# Patient Record
Sex: Female | Born: 1955
Health system: Southern US, Community
[De-identification: ages and names within clinical notes are randomized; demographics above are authoritative.]

## PROBLEM LIST (undated history)

## (undated) DIAGNOSIS — Z9889 Other specified postprocedural states: Secondary | ICD-10-CM

## (undated) DIAGNOSIS — I872 Venous insufficiency (chronic) (peripheral): Secondary | ICD-10-CM

## (undated) DIAGNOSIS — J45909 Unspecified asthma, uncomplicated: Secondary | ICD-10-CM

## (undated) DIAGNOSIS — G5601 Carpal tunnel syndrome, right upper limb: Secondary | ICD-10-CM

## (undated) DIAGNOSIS — R112 Nausea with vomiting, unspecified: Secondary | ICD-10-CM

## (undated) DIAGNOSIS — D649 Anemia, unspecified: Secondary | ICD-10-CM

## (undated) DIAGNOSIS — K219 Gastro-esophageal reflux disease without esophagitis: Secondary | ICD-10-CM

## (undated) DIAGNOSIS — I10 Essential (primary) hypertension: Secondary | ICD-10-CM

## (undated) DIAGNOSIS — Z8719 Personal history of other diseases of the digestive system: Secondary | ICD-10-CM

## (undated) DIAGNOSIS — M1990 Unspecified osteoarthritis, unspecified site: Secondary | ICD-10-CM

## (undated) DIAGNOSIS — J40 Bronchitis, not specified as acute or chronic: Secondary | ICD-10-CM

## (undated) DIAGNOSIS — J189 Pneumonia, unspecified organism: Secondary | ICD-10-CM

## (undated) HISTORY — PX: ABDOMINAL HYSTERECTOMY: SHX81

## (undated) HISTORY — DX: Venous insufficiency (chronic) (peripheral): I87.2

## (undated) HISTORY — PX: NISSEN FUNDOPLICATION: SHX2091

---

## 2001-04-23 ENCOUNTER — Ambulatory Visit (HOSPITAL_COMMUNITY): Admission: RE | Admit: 2001-04-23 | Discharge: 2001-04-23 | Payer: Self-pay | Admitting: Family Medicine

## 2001-04-23 ENCOUNTER — Encounter: Payer: Self-pay | Admitting: Family Medicine

## 2001-12-11 ENCOUNTER — Encounter: Payer: Self-pay | Admitting: Family Medicine

## 2001-12-11 ENCOUNTER — Ambulatory Visit (HOSPITAL_COMMUNITY): Admission: RE | Admit: 2001-12-11 | Discharge: 2001-12-11 | Payer: Self-pay | Admitting: Family Medicine

## 2002-01-20 ENCOUNTER — Ambulatory Visit (HOSPITAL_COMMUNITY): Admission: RE | Admit: 2002-01-20 | Discharge: 2002-01-20 | Payer: Self-pay | Admitting: Endocrinology

## 2003-07-22 ENCOUNTER — Ambulatory Visit (HOSPITAL_COMMUNITY): Admission: RE | Admit: 2003-07-22 | Discharge: 2003-07-22 | Payer: Self-pay | Admitting: Family Medicine

## 2003-09-02 ENCOUNTER — Ambulatory Visit (HOSPITAL_COMMUNITY): Admission: RE | Admit: 2003-09-02 | Discharge: 2003-09-02 | Payer: Self-pay | Admitting: Obstetrics & Gynecology

## 2004-07-03 ENCOUNTER — Emergency Department (HOSPITAL_COMMUNITY): Admission: EM | Admit: 2004-07-03 | Discharge: 2004-07-03 | Payer: Self-pay | Admitting: Emergency Medicine

## 2005-09-27 ENCOUNTER — Ambulatory Visit (HOSPITAL_COMMUNITY): Admission: RE | Admit: 2005-09-27 | Discharge: 2005-09-27 | Payer: Self-pay | Admitting: Family Medicine

## 2005-10-22 ENCOUNTER — Ambulatory Visit (HOSPITAL_COMMUNITY): Admission: RE | Admit: 2005-10-22 | Discharge: 2005-10-22 | Payer: Self-pay | Admitting: Internal Medicine

## 2005-10-22 ENCOUNTER — Encounter (INDEPENDENT_AMBULATORY_CARE_PROVIDER_SITE_OTHER): Payer: Self-pay | Admitting: Specialist

## 2005-10-22 ENCOUNTER — Ambulatory Visit: Payer: Self-pay | Admitting: Internal Medicine

## 2006-03-27 ENCOUNTER — Ambulatory Visit: Payer: Self-pay | Admitting: Internal Medicine

## 2006-04-01 ENCOUNTER — Ambulatory Visit (HOSPITAL_COMMUNITY): Admission: RE | Admit: 2006-04-01 | Discharge: 2006-04-01 | Payer: Self-pay | Admitting: Internal Medicine

## 2006-08-14 ENCOUNTER — Ambulatory Visit: Payer: Self-pay | Admitting: Internal Medicine

## 2007-03-11 ENCOUNTER — Ambulatory Visit: Payer: Self-pay | Admitting: Vascular Surgery

## 2007-04-15 ENCOUNTER — Ambulatory Visit: Payer: Self-pay | Admitting: Vascular Surgery

## 2007-04-22 ENCOUNTER — Ambulatory Visit: Payer: Self-pay | Admitting: Vascular Surgery

## 2007-06-17 ENCOUNTER — Ambulatory Visit: Payer: Self-pay | Admitting: Vascular Surgery

## 2007-06-24 ENCOUNTER — Ambulatory Visit: Payer: Self-pay | Admitting: Vascular Surgery

## 2007-08-07 ENCOUNTER — Ambulatory Visit: Payer: Self-pay | Admitting: Vascular Surgery

## 2008-12-22 ENCOUNTER — Ambulatory Visit (HOSPITAL_COMMUNITY): Admission: RE | Admit: 2008-12-22 | Discharge: 2008-12-22 | Payer: Self-pay | Admitting: Family Medicine

## 2010-10-12 ENCOUNTER — Other Ambulatory Visit (HOSPITAL_COMMUNITY): Payer: Self-pay | Admitting: Family Medicine

## 2010-10-12 DIAGNOSIS — Z139 Encounter for screening, unspecified: Secondary | ICD-10-CM

## 2010-11-06 ENCOUNTER — Ambulatory Visit (HOSPITAL_COMMUNITY)
Admission: RE | Admit: 2010-11-06 | Discharge: 2010-11-06 | Disposition: A | Discharge status: Home / Self Care | Payer: BC Managed Care – PPO | Source: Ambulatory Visit | Attending: Family Medicine | Admitting: Family Medicine

## 2010-11-06 DIAGNOSIS — Z1231 Encounter for screening mammogram for malignant neoplasm of breast: Secondary | ICD-10-CM | POA: Insufficient documentation

## 2010-11-06 DIAGNOSIS — Z139 Encounter for screening, unspecified: Secondary | ICD-10-CM

## 2010-11-27 NOTE — Assessment & Plan Note (Signed)
OFFICE VISIT   Mehler, Ryan P  DOB:  [birth year]                                       08/07/2007  [id number]#:[id number]   The patient presents today for continued followup of her bilateral  staged laser ablation of her greater saphenous vein and stab  phlebectomies of tributary varicosities.  She had had bleeding from her  right ankle region prior to her procedures.  She is quite pleased with  the results.  She reports that she has had marked improvement in  discomfort and swelling in her lower extremities.   PHYSICAL EXAM:  Her bruising has all totally resolved.  She does not  have any evidence of marked varicosities.  I am quite pleased with her  result and I will see her again on an as needed basis.   Larina Earthly, M.D.  Electronically Signed   TFE/MEDQ  D:  08/07/2007  T:  08/10/2007  Job:  [id number]   cc:   Patrica Duel, M.D.

## 2010-11-27 NOTE — Assessment & Plan Note (Signed)
OFFICE VISIT   Denz, Lashelle P  DOB:  April 12, 1956                                       04/22/2007  [id number]#:[id number]   OFFICE VISIT:  The patient presents in the office today for follow-up of  her right leg laser ablation and sclerotherapy of reticular veins that  have bled at her right ankle.  She has the usual amount of erythema at  the medial thigh.  She did have some mild irritation from the stocking.  She underwent limited venous duplex from me, and this revealed closure  of her right vein from the knee to just below the saphenofemoral  junction, and no injury to the common femoral vein.  She was pleased  with her initial result, and will scheduled her left leg ablation around  her work schedule.   Larina Earthly, M.D.  Electronically Signed   TFE/MEDQ  D:  04/22/2007  T:  04/23/2007  Job:  [id number]   cc:   Patrica Duel, M.D.

## 2010-11-27 NOTE — Consult Note (Signed)
VASCULAR SURGERY CONSULTATION   Jessica [name], Yuli P  DOB:  10/19/1955                                       03/11/2007  EAVWU#:[id number]   Jessica [name] presents today for evaluation of venous pathology and  recent bleeding from her right ankle.  She has a long history of  swelling and venous hypertension and had two episodes of bleeding from a  superficial varix on her right ankle.  She called the paramedics on the  first episode of this and was treated with elevation and local care.  Had one additional bleed, was seen by Dr. Nobie Putnam, and had a treatment  with topical antibiotic and compression.  I am seeing her for further  discussion of this.  She does have thickening on both lower extremities  and does have a heavy feeling with prolonged standing.   PAST MEDICAL HISTORY:  1. Hypertension, which is stable.  2. She has had multiple surgical procedures in the late 90s for hiatal      hernia.  3. She had a hysterectomy in 1993.   She does not smoke having quit in 1996, she does not drink alcohol.   PHYSICAL EXAMINATION:  General:  She is a well-developed, moderately  obese, white female in no acute distress.  Lower extremities:  Notable  for 2+ dorsalis pedis pulses bilaterally.  She has marked hemosiderin  deposits in both lower extremities from her mid calf distally, medial  and laterally.  She has multiple small superficial varices over both  ankles extending down onto her feet.  She does have an area where she  has bled twice with a superficial varix at her right ankle.  She also  has an area that gives the appearance of a healed ulcer on her medial  left ankle, but reports that she has never had any skin breakdown over  this area.   She underwent a duplex evaluation in our office and this reveals marked  reflux from her saphenofemoral junction distally in both great saphenous  veins.  Her deep venous study shows no evidence of reflux or DVT  bilaterally.   I discussed this at length with [name].  I explained  that she does have marked bilateral venous hypertension and this has led  to her recurrent episode of significant bleeding from her right ankle.  I explained the treatment would be laser ablation of her right great  saphenous vein for reduction in her venous hypertension.  I have also  recommended sclerotherapy of the areas that have bled for reduction of a  bleeding risk in the future.  She has not had any bleeding from her left  leg but does have a marked progression of venous hypertension changes  with hemosiderin deposit and thickening and that I would recommend a  staged left leg laser ablation when she has recovered from her right  side and we have assured no further bleeding episodes.  We will fit her  with a graduated compression stocking in the interim and instructed her  on the use of these.  We will schedule her right leg treatment once  insurance coverage is assured.   Larina Earthly, M.D.  Electronically Signed  TFE/MEDQ  D:  03/11/2007  T:  03/12/2007  Job:  [id number]

## 2010-11-27 NOTE — Procedures (Signed)
LOWER EXTREMITY VENOUS REFLUX EXAM   INDICATION:  Bilateral legs varicose veins and pain.   EXAM:  Using color-flow imaging and pulse Doppler spectral analysis, the  right and left common femoral, superficial femoral, popliteal, posterior  tibial, greater and lesser saphenous veins are evaluated.  There is no  evidence suggesting deep venous insufficiency in the right and left  lower extremity.   The right and left saphenofemoral junction is not competent.  The right  and left GSV is not competent with the caliber as described below.   The right and left proximal short saphenous vein demonstrates  competency.   GSV Diameter (used if found to be incompetent only)                                            Right    Left  Proximal Greater Saphenous Vein           0.54 cm  0.85 cm  Proximal-to-mid-thigh                     0.54 cm  0.85 cm  Mid thigh                                 0.48 cm  0.46 cm  Mid-distal thigh                          0.48 cm  0.46 cm  Distal thigh                              0.49 cm  0.44 cm  Knee                                      0.54 cm  0.44 cm   IMPRESSION:  1. Right and left greater saphenous vein reflux is identified with the      caliber ranging from on the right 0.54 cm to 0.54 cm knee to groin      and left is 0.44 to 0.85 knee to groin.  2. The right and left greater saphenous vein is not aneurysmal.  3. The right and left greater saphenous vein is not tortuous.  4. There is no evidence of significant deep venous insufficiency in      bilateral legs.  5. The deep venous system is competent.  6. The right and left lesser saphenous vein is competent.   ___________________________________________  Larina Earthly, M.D.   MG/MEDQ  D:  03/11/2007  T:  03/12/2007  Job:  [id number]

## 2010-11-27 NOTE — Assessment & Plan Note (Signed)
OFFICE VISIT   [name], Jessica P  DOB:  06-09-1956                                       06/24/2007  [id number]#:[id number]   The patient presents today 1 week status post left laser ablation and  stab phlebectomy of tributary varicosities of her left leg.  She has had  mild discomfort related to this.  She does have an amount of erythema  over the medial thigh and a mild bruising of the phlebectomy sites.  She  underwent limited venous duplex and this reveals closure of her left  greater saphenous vein up to just below the saphenofemoral junction.  Her common femoral vein is widely patent with no evidence of jaundice.  I am quite pleased with her initial result, as is the patient.  I will  see her again in 6 weeks for continued followup.   Larina Earthly, M.D.  Electronically Signed   TFE/MEDQ  D:  06/24/2007  T:  06/25/2007  Job:  [id number]

## 2010-11-30 NOTE — Op Note (Signed)
Jessica [name], Jessica [name]               ACCOUNT NO.:  [id number]   MEDICAL RECORD NO.:  [id number]          PATIENT TYPE:  AMB   LOCATION:  DAY                           FACILITY:  [facility]   PHYSICIAN:  Lionel December, M.D.    DATE OF BIRTH:  [birth year]   DATE OF PROCEDURE:  10/22/2005  DATE OF DISCHARGE:                                 OPERATIVE REPORT   PROCEDURE:  Colonoscopy.   INDICATIONS:  Jessica [name] is a 55 year old Caucasian female who is here for a  screening colonoscopy.  Family history is negative for colorectal carcinoma.  The procedure is reviewed with the patient and informed consent was  obtained.   MEDICATIONS FOR CONSCIOUS SEDATION:  Demerol 50 mg IV, Versed 5 mg IV.   FINDINGS:  The procedure performed in endoscopy suite.  The patient's vital  signs and O2 sat were monitored during procedure and remained stable.  The  patient was placed in the left lateral decubitus position, rectal  examination performed.  No abnormality noted on external or digital exam.  The Olympus videoscope was placed in the rectum and advanced under vision  into the sigmoid colon and beyond.  Preparation was satisfactory.  The scope  was passed into the ascending colon and cecum.  The patient had to be turned  in a supine position in order to see the area behind the ileocecal valve.  Pictures taken of the appendiceal orifice and ileocecal valve.  As the scope  was withdrawn, the colonic mucosa was carefully examined and was normal  throughout.  There was 3 mm polyp at the rectosigmoid junction which was  ablated via cold biopsy.  The scope was retroflexed to examine the anorectal  junction and small hemorrhoids noted below the dentate line.  The endoscope  was straightened and withdrawn.  The patient tolerated the procedure well.   FINAL DIAGNOSIS:  Small polyp ablated via cold biopsy from rectosigmoid  junction.  Small external hemorrhoids.   RECOMMENDATIONS:  Standard instructive given. I will be  contacting the  patient with biopsy results and further recommendations.      Lionel December, M.D.  Electronically Signed     NR/MEDQ  D:  10/22/2005  T:  10/22/2005  Job:  [id number]   cc:   Kirk Ruths, M.D.  Fax: [phone]

## 2010-12-31 ENCOUNTER — Encounter (INDEPENDENT_AMBULATORY_CARE_PROVIDER_SITE_OTHER): Payer: BC Managed Care – PPO

## 2010-12-31 ENCOUNTER — Encounter (INDEPENDENT_AMBULATORY_CARE_PROVIDER_SITE_OTHER): Payer: BC Managed Care – PPO | Admitting: Vascular Surgery

## 2010-12-31 DIAGNOSIS — I83893 Varicose veins of bilateral lower extremities with other complications: Secondary | ICD-10-CM

## 2010-12-31 NOTE — Assessment & Plan Note (Signed)
OFFICE VISIT  Jessica [name], Jessica [name] P DOB:  1955-09-26                                       12/31/2010 [id number]#:[id number]  The patient is a patient well known to our practice who had laser ablation of both great saphenous veins by Dr. Arbie Cookey in 2008.  She also had sclerotherapy.  She had presented with 2 previous episodes of some bleeding varix in her right ankle.  She has done well since these procedures with no symptoms but has continued to wear her long-leg elastic compression stockings and continued elevation.  Today she had an episode of bleeding in the right ankle which was unprovoked.  This did respond to elevation and compression.  She has not been having increasing edema or increasing symptomatology of her legs but has been treating them with elastic compression.  She denies any chest pain, dyspnea on exertion, PND, orthopnea.  CHRONIC MEDICAL PROBLEMS: 1. Hypertension. 2. Previous history for hiatal hernia. 3. Status post hysterectomy.  SOCIAL HISTORY:  She is married.  She has 3 children.  She is an Print production planner.  Does not use tobacco or alcohol.  PHYSICAL EXAM:  Vital signs:  Blood pressure 140/78, heart rate 67, respirations 24.  General:  She is a well-developed, well-nourished female in no apparent distress, alert and oriented x3.  HEENT:  Exam normal for age.  EOMs intact.  Lower extremity:  Exam reveals 3+ femoral, 2+ dorsalis pedis pulses bilaterally.  She has multiple spider and reticular veins in the lower leg particularly from the mid calf to the ankle.  There is 1+ edema bilaterally.  There is some hyperpigmentation bilaterally.  There is a small eschar over the site where the bleeding occurred and adjacent to the medial malleolus.  There is no active bleeding.  Today I ordered venous duplex exam which I reviewed and interpreted. Right great saphenous vein is totally occluded from the distal thigh to the saphenofemoral junction but  there is a branch open in this area which does have reflux.  There is no DVT.  Because of this patient's history of bleeding today I do think that we need to proceed urgently with sclerotherapy of this varix and reticular vein at the ankle to prevent further bleeding hopefully.  We will then apply elastic compression.  She will be going out of town on a vacation soon and in 2 weeks will return for further followup by Dr. Arbie Cookey.    Quita Skye Hart Rochester, M.D. Electronically Signed  JDL/MEDQ  D:  12/31/2010  T:  12/31/2010  Job:  [id number]

## 2011-01-08 NOTE — Procedures (Unsigned)
LOWER EXTREMITY VENOUS REFLUX EXAM  INDICATION:  Right lower extremity pain and varicose veins.  EXAM:  Using color-flow imaging and pulse Doppler spectral analysis, the right common femoral, superficial femoral, popliteal, posterior tibial, greater and lesser saphenous veins are evaluated.  There is no evidence suggesting deep venous insufficiency in the right lower extremity.  The right saphenofemoral junction is not competent with reflux of >559milliseconds. The right GSV from the mid distal thigh segment is not competent with reflux of >572milliseconds with the caliber as described below.  The right proximal short saphenous vein demonstrates competency.  GSV Diameter (used if found to be incompetent only)                                           Right       Left Proximal Greater Saphenous Vein           0.55 cm     cm Proximal-to-mid-thigh                     Thrombosed  cm Mid thigh                                 Thrombosed  cm Mid-distal thigh                          0.45 cm     cm Distal thigh                              0.88 cm     cm Knee                                      0.60 cm     cm Below Knee  0.57 cm  IMPRESSION: 1. The right greater saphenous vein from the mid distal thigh to below-     knee segment is not competent with reflux >500 milliseconds. 2. The right greater saphenous vein is tortuous. 3. The deep venous system is competent. 4. The right small saphenous vein is competent. 5. The right lower extremity deep venous system appears patent and     without thrombus. 6. The proximal to mid/distal thigh segment of the right greater     saphenous vein presents with evidence of previous ablation done     04/15/2007, and appears closed.       ___________________________________________ Quita Skye Hart Rochester, M.D.  SH/MEDQ  D:  12/31/2010  T:  12/31/2010  Job:  [id number]

## 2011-01-15 ENCOUNTER — Ambulatory Visit (INDEPENDENT_AMBULATORY_CARE_PROVIDER_SITE_OTHER): Payer: BC Managed Care – PPO | Admitting: Vascular Surgery

## 2011-01-15 DIAGNOSIS — I83893 Varicose veins of bilateral lower extremities with other complications: Secondary | ICD-10-CM

## 2011-01-17 NOTE — Assessment & Plan Note (Signed)
OFFICE VISIT  Jessica [name], Jessica [name] DOB:  01/04/1956                                       01/15/2011 UEAVW#:[id number]  Patient presents today for follow-up of bleeding from her right medial ankle.  She had seen Dr. Hart Rochester on this day and underwent sclerotherapy by Clementeen Hoof, RN.  She has had no further bleeding.  She thinks that she did have evidence of ablation of her saphenous vein.  When seeing Dr. Hart Rochester with ultrasound, she did have a small branch coming off this. She has had no further bleeding since this study.  Three weeks ago, the area of the sclerotherapy appeared to have successfully occluded these branches.  I discussed this at length with patient.  I do not recommend any attempts at addressing this small branch off of her saphenous vein since I do not feel that this is leading to the area of bleeding.  I would recommend conservative treatment, and if she does continue to have bleeding, would recommend repeat sclerotherapy.  If this does continue to persist, I would then consider other treatment options.  She has not had any difficulty in 3 years, and I suspect this will an isolated incident.  She will notify us should she have recurrent troubles.    Larina Earthly, M.D. Electronically Signed  TFE/MEDQ  D:  01/15/2011  T:  01/17/2011  Job:  [id number]

## 2011-04-15 ENCOUNTER — Ambulatory Visit (HOSPITAL_COMMUNITY)
Admission: RE | Admit: 2011-04-15 | Discharge: 2011-04-15 | Disposition: A | Discharge status: Home / Self Care | Payer: BC Managed Care – PPO | Source: Ambulatory Visit | Attending: Family Medicine | Admitting: Family Medicine

## 2011-04-15 ENCOUNTER — Other Ambulatory Visit (HOSPITAL_COMMUNITY): Payer: Self-pay | Admitting: Family Medicine

## 2011-04-15 DIAGNOSIS — R05 Cough: Secondary | ICD-10-CM | POA: Insufficient documentation

## 2011-04-15 DIAGNOSIS — J069 Acute upper respiratory infection, unspecified: Secondary | ICD-10-CM

## 2011-04-15 DIAGNOSIS — R059 Cough, unspecified: Secondary | ICD-10-CM

## 2011-04-15 DIAGNOSIS — R062 Wheezing: Secondary | ICD-10-CM | POA: Insufficient documentation

## 2011-04-15 DIAGNOSIS — R509 Fever, unspecified: Secondary | ICD-10-CM | POA: Insufficient documentation

## 2012-05-05 ENCOUNTER — Telehealth (INDEPENDENT_AMBULATORY_CARE_PROVIDER_SITE_OTHER): Payer: Self-pay | Admitting: *Deleted

## 2012-05-05 ENCOUNTER — Other Ambulatory Visit (INDEPENDENT_AMBULATORY_CARE_PROVIDER_SITE_OTHER): Payer: Self-pay | Admitting: *Deleted

## 2012-05-05 DIAGNOSIS — Z1211 Encounter for screening for malignant neoplasm of colon: Secondary | ICD-10-CM

## 2012-05-05 DIAGNOSIS — Z86010 Personal history of colonic polyps: Secondary | ICD-10-CM

## 2012-05-05 NOTE — Telephone Encounter (Signed)
Patient needs osmo pill prep 

## 2012-05-06 ENCOUNTER — Telehealth (INDEPENDENT_AMBULATORY_CARE_PROVIDER_SITE_OTHER): Payer: Self-pay | Admitting: *Deleted

## 2012-05-06 NOTE — Telephone Encounter (Signed)
PCP/Requesting MD: mcgough  Name & DOB: Delora Tapp 1956/04/07   Procedure: tcs  Reason/Indication:  Hx poylps  Has patient had this procedure before?  yes  If so, when, by whom and where?  4/07  Is there a family history of colon cancer?  no  Who?  What age when diagnosed?    Is patient diabetic?   no      Does patient have prosthetic heart valve?  no  Do you have a pacemaker?  no  Has patient had joint replacement within last 12 months?  no  Is patient on Coumadin, Plavix and/or Aspirin? no  Medications: ramipril 5 mg daily, nexium 40 mg daily or every other day, hctz 25 mg prn, multi vit  Allergies: codiene, pcn, tetanus  Medication Adjustment:   Procedure date & time: 06/05/12 at 125

## 2012-05-07 MED ORDER — SOD PHOS MONO-SOD PHOS DIBASIC 1.102-0.398 G PO TABS: 1.0000 | ORAL_TABLET | Freq: Once | ORAL | Status: DC

## 2012-05-07 NOTE — Telephone Encounter (Signed)
agree

## 2012-06-01 ENCOUNTER — Encounter (HOSPITAL_COMMUNITY): Payer: Self-pay | Admitting: Pharmacy Technician

## 2012-06-05 ENCOUNTER — Encounter (HOSPITAL_COMMUNITY)
Admission: RE | Disposition: A | Discharge status: Home / Self Care | Payer: Self-pay | Source: Ambulatory Visit | Attending: Internal Medicine

## 2012-06-05 ENCOUNTER — Encounter (HOSPITAL_COMMUNITY): Payer: Self-pay | Admitting: *Deleted

## 2012-06-05 ENCOUNTER — Ambulatory Visit (HOSPITAL_COMMUNITY)
Admission: RE | Admit: 2012-06-05 | Discharge: 2012-06-05 | Disposition: A | Discharge status: Home / Self Care | Payer: BC Managed Care – PPO | Source: Ambulatory Visit | Attending: Internal Medicine | Admitting: Internal Medicine

## 2012-06-05 DIAGNOSIS — D126 Benign neoplasm of colon, unspecified: Secondary | ICD-10-CM

## 2012-06-05 DIAGNOSIS — Z86010 Personal history of colonic polyps: Secondary | ICD-10-CM

## 2012-06-05 DIAGNOSIS — I10 Essential (primary) hypertension: Secondary | ICD-10-CM | POA: Insufficient documentation

## 2012-06-05 DIAGNOSIS — J45909 Unspecified asthma, uncomplicated: Secondary | ICD-10-CM | POA: Insufficient documentation

## 2012-06-05 HISTORY — DX: Unspecified asthma, uncomplicated: J45.909

## 2012-06-05 HISTORY — DX: Unspecified osteoarthritis, unspecified site: M19.90

## 2012-06-05 HISTORY — PX: COLONOSCOPY: SHX5424

## 2012-06-05 HISTORY — DX: Essential (primary) hypertension: I10

## 2012-06-05 HISTORY — DX: Gastro-esophageal reflux disease without esophagitis: K21.9

## 2012-06-05 SURGERY — COLONOSCOPY
Anesthesia: Moderate Sedation

## 2012-06-05 MED ORDER — SODIUM CHLORIDE 0.45 % IV SOLN
INTRAVENOUS | Status: DC
  Administered 2012-06-05: 1000 mL via INTRAVENOUS

## 2012-06-05 MED ORDER — MIDAZOLAM HCL 5 MG/5ML IJ SOLN
INTRAMUSCULAR | Status: AC
  Filled 2012-06-05: qty 10

## 2012-06-05 MED ORDER — MEPERIDINE HCL 50 MG/ML IJ SOLN
INTRAMUSCULAR | Status: AC
  Filled 2012-06-05: qty 1

## 2012-06-05 MED ORDER — MEPERIDINE HCL 50 MG/ML IJ SOLN
INTRAMUSCULAR | Status: DC | PRN
  Administered 2012-06-05 (×2): 25 mg via INTRAVENOUS

## 2012-06-05 MED ORDER — MIDAZOLAM HCL 5 MG/5ML IJ SOLN
INTRAMUSCULAR | Status: DC | PRN
  Administered 2012-06-05 (×2): 2 mg via INTRAVENOUS
  Administered 2012-06-05: 1 mg via INTRAVENOUS

## 2012-06-05 MED ORDER — STERILE WATER FOR IRRIGATION IR SOLN
Status: DC | PRN
  Administered 2012-06-05: 14:00:00

## 2012-06-05 NOTE — Op Note (Signed)
COLONOSCOPY PROCEDURE REPORT  PATIENT:  Jessica [name]  MR#:  [id number] Birthdate:  June 01, 1956, 56 y.o., female Endoscopist:  Dr. Malissa Hippo, MD Referred By:  Dr. Kirk Ruths, MD Procedure Date: 06/05/2012  Procedure:   Colonoscopy  Indications: Patient is 56 year old Caucasian female with history of colonic adenoma in here for surveillance colonoscopy. Her last colonoscopy was in April 2007.  Informed Consent:  The procedure and risks were reviewed with the patient and informed consent was obtained.  Medications:  Demerol 50 mg IV Versed 5 mg IV  Description of procedure:  After a digital rectal exam was performed, that colonoscope was advanced from the anus through the rectum and colon to the area of the cecum, ileocecal valve and appendiceal orifice. The cecum was deeply intubated. These structures were well-seen and photographed for the record. From the level of the cecum and ileocecal valve, the scope was slowly and cautiously withdrawn. The mucosal surfaces were carefully surveyed utilizing scope tip to flexion to facilitate fold flattening as needed. The scope was pulled down into the rectum where a thorough exam including retroflexion was performed.  Findings:   Prep satisfactory. Single small polyp ablated via cold biopsy from mid sigmoid colon. No other mucosal abnormalities identified. Normal rectal mucosa and anal rectal junction.  Therapeutic/Diagnostic Maneuvers Performed:  See above  Complications:  None  Cecal Withdrawal Time:  12 minutes  Impression: Examination performed to cecum. Single  small polyp ablated via cold biopsy from mid sigmoid colon.  Recommendations:  Standard instructions given. I would be contacting patient with biopsy results and further recommendations.  Jericka Kadar U  06/05/2012 2:23 PM  CC: Dr. Kirk Ruths, MD & Dr. Bonnetta Barry ref. provider found

## 2012-06-05 NOTE — H&P (Signed)
Jessica [name] is an 56 y.o. female.   Chief Complaint: Patient is here for colonoscopy. HPI: Patient is 56 year old Caucasian female with history of colonic adenoma. Her last colonoscopy was about 6 years ago. She denies abdominal pain change in her bowel habits or rectal bleeding. Family history is negative for colorectal carcinoma.  Past Medical History  Diagnosis Date  . Hypertension   . GERD (gastroesophageal reflux disease)   . Asthma   . Arthritis     Past Surgical History  Procedure Date  . Abdominal hysterectomy   . Nissen fundoplication     x2    History reviewed. No pertinent family history. Social History:  reports that she has never smoked. She does not have any smokeless tobacco history on file. She reports that she does not drink alcohol or use illicit drugs.  Allergies:  Allergies  Allergen Reactions  . Codeine Other (See Comments)    Severe headaches   . Tetanus Toxoids Swelling and Other (See Comments)    fever  . Penicillins Rash    Medications Prior to Admission  Medication Sig Dispense Refill  . calcium carbonate (OS-CAL) 600 MG TABS Take 600 mg by mouth daily.      . cholecalciferol (VITAMIN D) 1000 UNITS tablet Take 1,000 Units by mouth daily.      Marland Kitchen esomeprazole (NEXIUM) 40 MG capsule Take 40 mg by mouth daily before breakfast.      . hydrochlorothiazide (HYDRODIURIL) 25 MG tablet Take 25 mg by mouth daily.      Marland Kitchen ibuprofen (ADVIL,MOTRIN) 200 MG tablet Take 400 mg by mouth every 6 (six) hours as needed. pain      . naproxen sodium (ANAPROX) 220 MG tablet Take 220 mg by mouth 2 (two) times daily with a meal.      . Omega-3 Fatty Acids (FISH OIL) 1000 MG CAPS Take 1,000 mg by mouth daily.      . Polyvinyl Alcohol-Povidone (FRESHKOTE) 2.7-2 % SOLN Place 1 drop into the left eye 3 (three) times daily.      . ramipril (ALTACE) 5 MG capsule Take 5 mg by mouth daily.      . retapamulin (ALTABAX) 1 % ointment Apply 1 application topically 2 (two) times  daily.      . sodium chloride (MURO 128) 5 % ophthalmic ointment Place 1 drop into the left eye at bedtime.      . sodium phosphates (OSMOPREP) 1.102-0.398 G TABS Take 1 tablet by mouth once.  32 tablet  0    No results found for this or any previous visit (from the past 48 hour(s)). No results found.  ROS  Blood pressure 117/64, temperature 98.4 F (36.9 C), temperature source Oral, resp. rate 15, height 5\' 4"  (1.626 m), weight 270 lb (122.471 kg), SpO2 94.00%. Physical Exam  Constitutional: She appears well-developed and well-nourished.  HENT:  Mouth/Throat: Oropharynx is clear and moist.  Eyes: Conjunctivae normal are normal. No scleral icterus.  Neck: No thyromegaly present.  Cardiovascular: Normal rate, regular rhythm and normal heart sounds.   No murmur heard. Respiratory: Effort normal and breath sounds normal.  GI: Soft. She exhibits no distension and no mass. There is no tenderness.  Musculoskeletal: She exhibits no edema.       Stasis dermatitis or skin involving both legs  Lymphadenopathy:    She has no cervical adenopathy.  Neurological: She is alert.  Skin: Skin is warm.     Assessment/Plan History of colonic adenoma. Evidence colonoscopy  Jessica [name] 06/05/2012, 1:51 PM

## 2012-06-10 ENCOUNTER — Telehealth (INDEPENDENT_AMBULATORY_CARE_PROVIDER_SITE_OTHER): Payer: Self-pay | Admitting: *Deleted

## 2012-06-10 NOTE — Telephone Encounter (Signed)
Patient was called and made aware that the polyp that was removed was benign. Dr.Rehman will review and call her with his recommendations for her next Colonoscopy

## 2012-06-14 ENCOUNTER — Encounter (HOSPITAL_COMMUNITY): Payer: Self-pay | Admitting: Internal Medicine

## 2012-06-22 ENCOUNTER — Encounter (INDEPENDENT_AMBULATORY_CARE_PROVIDER_SITE_OTHER): Payer: Self-pay | Admitting: *Deleted

## 2013-07-12 ENCOUNTER — Ambulatory Visit (HOSPITAL_COMMUNITY)
Admission: RE | Admit: 2013-07-12 | Discharge: 2013-07-12 | Disposition: A | Discharge status: Home / Self Care | Payer: BC Managed Care – PPO | Source: Ambulatory Visit | Attending: Family Medicine | Admitting: Family Medicine

## 2013-07-12 ENCOUNTER — Other Ambulatory Visit (HOSPITAL_COMMUNITY): Payer: Self-pay | Admitting: Family Medicine

## 2013-07-12 DIAGNOSIS — J069 Acute upper respiratory infection, unspecified: Secondary | ICD-10-CM

## 2013-07-12 DIAGNOSIS — R0989 Other specified symptoms and signs involving the circulatory and respiratory systems: Secondary | ICD-10-CM | POA: Insufficient documentation

## 2013-07-12 DIAGNOSIS — R062 Wheezing: Secondary | ICD-10-CM | POA: Insufficient documentation

## 2013-07-12 DIAGNOSIS — I771 Stricture of artery: Secondary | ICD-10-CM | POA: Insufficient documentation

## 2014-04-21 ENCOUNTER — Other Ambulatory Visit (HOSPITAL_COMMUNITY): Payer: Self-pay | Admitting: Physician Assistant

## 2014-04-21 ENCOUNTER — Ambulatory Visit (HOSPITAL_COMMUNITY)
Admission: RE | Admit: 2014-04-21 | Discharge: 2014-04-21 | Disposition: A | Discharge status: Home / Self Care | Payer: BC Managed Care – PPO | Source: Ambulatory Visit | Attending: Physician Assistant | Admitting: Physician Assistant

## 2014-04-21 DIAGNOSIS — G8929 Other chronic pain: Secondary | ICD-10-CM

## 2014-04-21 DIAGNOSIS — S92351A Displaced fracture of fifth metatarsal bone, right foot, initial encounter for closed fracture: Secondary | ICD-10-CM | POA: Diagnosis not present

## 2014-04-21 DIAGNOSIS — X58XXXA Exposure to other specified factors, initial encounter: Secondary | ICD-10-CM | POA: Diagnosis not present

## 2014-04-21 DIAGNOSIS — M79671 Pain in right foot: Secondary | ICD-10-CM | POA: Diagnosis present

## 2015-10-24 DIAGNOSIS — R300 Dysuria: Secondary | ICD-10-CM | POA: Diagnosis not present

## 2015-10-24 DIAGNOSIS — N760 Acute vaginitis: Secondary | ICD-10-CM | POA: Diagnosis not present

## 2016-03-13 DIAGNOSIS — L820 Inflamed seborrheic keratosis: Secondary | ICD-10-CM | POA: Diagnosis not present

## 2016-03-13 DIAGNOSIS — B9689 Other specified bacterial agents as the cause of diseases classified elsewhere: Secondary | ICD-10-CM | POA: Diagnosis not present

## 2016-03-13 DIAGNOSIS — L0202 Furuncle of face: Secondary | ICD-10-CM | POA: Diagnosis not present

## 2016-03-13 DIAGNOSIS — D485 Neoplasm of uncertain behavior of skin: Secondary | ICD-10-CM | POA: Diagnosis not present

## 2016-03-13 DIAGNOSIS — D225 Melanocytic nevi of trunk: Secondary | ICD-10-CM | POA: Diagnosis not present

## 2016-03-27 DIAGNOSIS — D485 Neoplasm of uncertain behavior of skin: Secondary | ICD-10-CM | POA: Diagnosis not present

## 2016-03-27 DIAGNOSIS — D225 Melanocytic nevi of trunk: Secondary | ICD-10-CM | POA: Diagnosis not present

## 2016-03-27 DIAGNOSIS — D2261 Melanocytic nevi of right upper limb, including shoulder: Secondary | ICD-10-CM | POA: Diagnosis not present

## 2016-04-22 DIAGNOSIS — Z6841 Body Mass Index (BMI) 40.0 and over, adult: Secondary | ICD-10-CM | POA: Diagnosis not present

## 2016-04-22 DIAGNOSIS — Z1231 Encounter for screening mammogram for malignant neoplasm of breast: Secondary | ICD-10-CM | POA: Diagnosis not present

## 2016-04-22 DIAGNOSIS — Z01419 Encounter for gynecological examination (general) (routine) without abnormal findings: Secondary | ICD-10-CM | POA: Diagnosis not present

## 2017-04-20 DIAGNOSIS — J209 Acute bronchitis, unspecified: Secondary | ICD-10-CM | POA: Diagnosis not present

## 2017-04-26 DIAGNOSIS — J209 Acute bronchitis, unspecified: Secondary | ICD-10-CM | POA: Diagnosis not present

## 2017-05-13 DIAGNOSIS — Z1389 Encounter for screening for other disorder: Secondary | ICD-10-CM | POA: Diagnosis not present

## 2017-05-13 DIAGNOSIS — Z6841 Body Mass Index (BMI) 40.0 and over, adult: Secondary | ICD-10-CM | POA: Diagnosis not present

## 2017-05-13 DIAGNOSIS — R05 Cough: Secondary | ICD-10-CM | POA: Diagnosis not present

## 2017-05-13 DIAGNOSIS — R498 Other voice and resonance disorders: Secondary | ICD-10-CM | POA: Diagnosis not present

## 2017-05-13 DIAGNOSIS — K219 Gastro-esophageal reflux disease without esophagitis: Secondary | ICD-10-CM | POA: Diagnosis not present

## 2017-05-30 ENCOUNTER — Encounter: Payer: Self-pay | Admitting: Family Medicine

## 2017-05-30 ENCOUNTER — Ambulatory Visit (INDEPENDENT_AMBULATORY_CARE_PROVIDER_SITE_OTHER): Payer: BLUE CROSS/BLUE SHIELD | Admitting: Family Medicine

## 2017-05-30 VITALS — BP 136/92 | HR 80 | Temp 98.0°F | Resp 18 | Ht 64.0 in | Wt 290.0 lb

## 2017-05-30 DIAGNOSIS — Z0000 Encounter for general adult medical examination without abnormal findings: Secondary | ICD-10-CM

## 2017-05-30 DIAGNOSIS — Z7689 Persons encountering health services in other specified circumstances: Secondary | ICD-10-CM

## 2017-05-30 DIAGNOSIS — I10 Essential (primary) hypertension: Secondary | ICD-10-CM | POA: Diagnosis not present

## 2017-05-30 DIAGNOSIS — Z23 Encounter for immunization: Secondary | ICD-10-CM

## 2017-05-30 LAB — CBC WITH DIFFERENTIAL/PLATELET
BASOS ABS: 51 {cells}/uL (ref 0–200)
Basophils Relative: 0.7 %
EOS ABS: 80 {cells}/uL (ref 15–500)
Eosinophils Relative: 1.1 %
HCT: 40.7 % (ref 35.0–45.0)
Hemoglobin: 13.5 g/dL (ref 11.7–15.5)
Lymphs Abs: 2803 cells/uL (ref 850–3900)
MCH: 27.8 pg (ref 27.0–33.0)
MCHC: 33.2 g/dL (ref 32.0–36.0)
MCV: 83.9 fL (ref 80.0–100.0)
MONOS PCT: 8.2 %
MPV: 11.2 fL (ref 7.5–12.5)
Neutro Abs: 3767 cells/uL (ref 1500–7800)
Neutrophils Relative %: 51.6 %
PLATELETS: 211 10*3/uL (ref 140–400)
RBC: 4.85 10*6/uL (ref 3.80–5.10)
RDW: 14.5 % (ref 11.0–15.0)
TOTAL LYMPHOCYTE: 38.4 %
WBC mixed population: 599 cells/uL (ref 200–950)
WBC: 7.3 10*3/uL (ref 3.8–10.8)

## 2017-05-30 LAB — COMPLETE METABOLIC PANEL WITH GFR
AG Ratio: 1.2 (calc) (ref 1.0–2.5)
ALKALINE PHOSPHATASE (APISO): 89 U/L (ref 33–130)
ALT: 19 U/L (ref 6–29)
AST: 19 U/L (ref 10–35)
Albumin: 3.9 g/dL (ref 3.6–5.1)
BUN: 16 mg/dL (ref 7–25)
CHLORIDE: 105 mmol/L (ref 98–110)
CO2: 30 mmol/L (ref 20–32)
Calcium: 9.4 mg/dL (ref 8.6–10.4)
Creat: 0.9 mg/dL (ref 0.50–0.99)
GFR, Est African American: 79 mL/min/{1.73_m2} (ref >=60)
GFR, Est Non African American: 68 mL/min/{1.73_m2} (ref >=60)
GLUCOSE: 108 mg/dL — AB (ref 65–99)
Globulin: 3.2 g/dL (calc) (ref 1.9–3.7)
Potassium: 4.3 mmol/L (ref 3.5–5.3)
Sodium: 141 mmol/L (ref 135–146)
Total Bilirubin: 0.4 mg/dL (ref 0.2–1.2)
Total Protein: 7.1 g/dL (ref 6.1–8.1)

## 2017-05-30 LAB — LIPID PANEL
CHOLESTEROL: 208 mg/dL — AB (ref <200)
HDL: 48 mg/dL — ABNORMAL LOW (ref >50)
LDL CHOLESTEROL (CALC): 141 mg/dL — AB
Non-HDL Cholesterol (Calc): 160 mg/dL (calc) — ABNORMAL HIGH (ref <130)
Total CHOL/HDL Ratio: 4.3 (calc) (ref <5.0)
Triglycerides: 88 mg/dL (ref <150)

## 2017-05-30 NOTE — Addendum Note (Signed)
Addended by: Shary Decamp B on: 05/30/2017 12:54 PM   Modules accepted: Orders

## 2017-05-30 NOTE — Progress Notes (Signed)
Subjective:    Patient ID: Jessica [name], female    DOB: 07/07/1956, 61 y.o.   MRN: [id number]  HPI Patient is a very pleasant 61 year old Caucasian female here today to establish care.  Past medical history is significant for hypertension, acid reflux status post Nissen fundoplication x2, asthma.  She also has a history of a partial hysterectomy due to vaginal bleeding as well as a spiral fracture of her right leg.  Review of her immunization status shows that she is due for a flu shot.  She is also due for Pneumovax 23 given her history of asthma.  She is due for the tetanus shot but she is allergic to tetanus and declines this.  She is also due for the shingles vaccine but she would like to defer this to a later date.  She sees her gynecologist, Dr. Stann Mainland, grams and Pap smears and pelvic exams.  Her last mammogram was more than a year ago.  I recommended that she repeat that.  Her last bone density was 4 years ago and was normal.  Her last colonoscopy was in 2013 and is not due again till 2023. Past Medical History:  Diagnosis Date  . Arthritis   . Asthma   . Chronic venous insufficiency   . GERD (gastroesophageal reflux disease)   . Hypertension    Past Surgical History:  Procedure Laterality Date  . ABDOMINAL HYSTERECTOMY    . COLONOSCOPY N/A 06/05/2012   Performed by Rogene Houston, MD at [place]  . NISSEN FUNDOPLICATION     x2   Current Outpatient Medications on File Prior to Visit  Medication Sig Dispense Refill  . albuterol (PROAIR HFA) 108 (90 Base) MCG/ACT inhaler Inhale 2 puffs every 6 (six) hours as needed into the lungs for wheezing or shortness of breath.    . calcium carbonate (OS-CAL) 600 MG TABS Take 600 mg by mouth daily.    . cetirizine (ZYRTEC) 10 MG tablet Take 10 mg daily by mouth.    . cholecalciferol (VITAMIN D) 1000 UNITS tablet Take 1,000 Units by mouth daily.    . Coenzyme Q10-Vitamin E (QUNOL ULTRA COQ10 PO) Take by mouth.    . esomeprazole  (NEXIUM) 40 MG capsule Take 40 mg by mouth daily before breakfast.    . hydrochlorothiazide (HYDRODIURIL) 25 MG tablet Take 25 mg by mouth daily.    . naproxen sodium (ANAPROX) 220 MG tablet Take 220 mg by mouth 2 (two) times daily with a meal.    . ramipril (ALTACE) 5 MG capsule Take 5 mg by mouth daily.    . sodium chloride (MURO 128) 5 % ophthalmic ointment Place 1 drop into the left eye at bedtime.     No current facility-administered medications on file prior to visit.    Allergies  Allergen Reactions  . Codeine Other (See Comments)    Severe headaches   . Tetanus Toxoids Swelling and Other (See Comments)    fever  . Penicillins Rash   Social History   Socioeconomic History  . Marital status: Married    Spouse name: Not on file  . Number of children: Not on file  . Years of education: Not on file  . Highest education level: Not on file  Social Needs  . Financial resource strain: Not on file  . Food insecurity - worry: Not on file  . Food insecurity - inability: Not on file  . Transportation needs - medical: Not on file  .  Transportation needs - non-medical: Not on file  Occupational History  . Not on file  Tobacco Use  . Smoking status: Never Smoker  . Smokeless tobacco: Never Used  Substance and Sexual Activity  . Alcohol use: No  . Drug use: No  . Sexual activity: Not on file  Other Topics Concern  . Not on file  Social History Narrative  . Not on file   No family history on file.    Review of Systems  All other systems reviewed and are negative.      Objective:   Physical Exam  Constitutional: She is oriented to person, place, and time. She appears well-developed and well-nourished. No distress.  HENT:  Head: Normocephalic and atraumatic.  Right Ear: External ear normal.  Left Ear: External ear normal.  Nose: Nose normal.  Mouth/Throat: Oropharynx is clear and moist. No oropharyngeal exudate.  Eyes: Conjunctivae and EOM are normal. Pupils are  equal, round, and reactive to light. Right eye exhibits no discharge. Left eye exhibits no discharge. No scleral icterus.  Neck: Normal range of motion. Neck supple. No JVD present. No tracheal deviation present. No thyromegaly present.  Cardiovascular: Normal rate, regular rhythm, normal heart sounds and intact distal pulses. Exam reveals no gallop and no friction rub.  No murmur heard. Pulmonary/Chest: Effort normal and breath sounds normal. No stridor. No respiratory distress. She has no wheezes. She has no rales. She exhibits no tenderness.  Abdominal: Soft. Bowel sounds are normal. She exhibits no distension and no mass. There is no tenderness. There is no rebound and no guarding.  Musculoskeletal: Normal range of motion. She exhibits no edema, tenderness or deformity.  Lymphadenopathy:    She has no cervical adenopathy.  Neurological: She is alert and oriented to person, place, and time. She has normal reflexes. She displays normal reflexes. No cranial nerve deficit. She exhibits normal muscle tone. Coordination normal.  Skin: Skin is warm. No rash noted. She is not diaphoretic. No erythema. No pallor.  Psychiatric: She has a normal mood and affect. Her behavior is normal. Judgment and thought content normal.  Vitals reviewed.         Assessment & Plan:  Benign essential HTN - Plan: CBC with Differential/Platelet, COMPLETE METABOLIC PANEL WITH GFR, Lipid panel  Encounter to establish care with new doctor  General medical exam  We discussed HIV and hepatitis C screening and the patient declined that today due to lack of risk factors.  She states that she will schedule herself for her mammogram.  Her colonoscopy is up-to-date.  She does not require Pap smear given her history of a hysterectomy.  Her bone density was 4 years ago.  Her blood pressure today is borderline but she is a little bit nervous.  I recommend that she check that more frequently at home and notify me of the values.   I will check a CBC, CMP, and fasting lipid panel.  She received her flu shot today along with Pneumovax 23.  I encouraged her to get a shingles vaccine at her convenience.

## 2017-06-10 ENCOUNTER — Encounter: Payer: Self-pay | Admitting: Family Medicine

## 2017-10-20 ENCOUNTER — Other Ambulatory Visit: Payer: Self-pay | Admitting: Family Medicine

## 2017-10-20 MED ORDER — RAMIPRIL 5 MG PO CAPS: 5.0000 mg | ORAL_CAPSULE | Freq: Every day | ORAL | 2 refills | Status: DC

## 2018-01-19 ENCOUNTER — Telehealth: Payer: Self-pay | Admitting: Physician Assistant

## 2018-01-19 NOTE — Telephone Encounter (Signed)
Patient is calling to get refill on her ramipril  She will need this to go to a pharmacy where she is on vacation, she will not know pharmacy until she gets to her destination and requested that she give the nurse this info when called  [phone]

## 2018-01-20 NOTE — Telephone Encounter (Signed)
Noted  

## 2018-04-28 ENCOUNTER — Ambulatory Visit (INDEPENDENT_AMBULATORY_CARE_PROVIDER_SITE_OTHER): Payer: BLUE CROSS/BLUE SHIELD | Admitting: Family Medicine

## 2018-04-28 ENCOUNTER — Encounter: Payer: Self-pay | Admitting: Family Medicine

## 2018-04-28 VITALS — BP 150/96 | HR 94 | Temp 98.1°F | Resp 18 | Ht 64.0 in | Wt 277.0 lb

## 2018-04-28 DIAGNOSIS — Z23 Encounter for immunization: Secondary | ICD-10-CM

## 2018-04-28 DIAGNOSIS — I872 Venous insufficiency (chronic) (peripheral): Secondary | ICD-10-CM

## 2018-04-28 MED ORDER — FUROSEMIDE 40 MG PO TABS: 40.0000 mg | ORAL_TABLET | Freq: Every day | ORAL | 0 refills | Status: DC

## 2018-04-28 NOTE — Progress Notes (Signed)
Subjective:    Patient ID: Jessica [name], female    DOB: 05-24-1956, 62 y.o.   MRN: [id number]  HPI Patient has a history of chronic venous insufficiency and numerous moderate to large varicose veins.  She also has chronic venous stasis dermatitis on the shins of both 6.  She has +1 pitting edema in both ankles.  Recently she has developed a vesicle that is approximately 1 cm in diameter on the anterior surface of her right shin.  Around the vesicle, there is +2 pitting edema.  I believe the vesicle is due to worsening edema secondary to her chronic venous insufficiency.  There is no other rash anywhere else on the body.  There is no erythema.  There is no warmth.  There is no pain. Past Medical History:  Diagnosis Date  . Arthritis   . Asthma   . Chronic venous insufficiency   . GERD (gastroesophageal reflux disease)   . Hypertension    Past Surgical History:  Procedure Laterality Date  . ABDOMINAL HYSTERECTOMY    . COLONOSCOPY  06/05/2012   Procedure: COLONOSCOPY;  Surgeon: Rogene Houston, MD;  Location: [facility];  Service: Endoscopy;  Laterality: N/A;  125-changed to 1225 Ann to notify pt  . NISSEN FUNDOPLICATION     x2   Current Outpatient Medications on File Prior to Visit  Medication Sig Dispense Refill  . albuterol (PROAIR HFA) 108 (90 Base) MCG/ACT inhaler Inhale 2 puffs every 6 (six) hours as needed into the lungs for wheezing or shortness of breath.    . calcium carbonate (OS-CAL) 600 MG TABS Take 600 mg by mouth daily.    . cetirizine (ZYRTEC) 10 MG tablet Take 10 mg daily by mouth.    . cholecalciferol (VITAMIN D) 1000 UNITS tablet Take 1,000 Units by mouth daily.    . Coenzyme Q10-Vitamin E (QUNOL ULTRA COQ10 PO) Take by mouth.    . esomeprazole (NEXIUM) 40 MG capsule Take 40 mg by mouth daily before breakfast.    . hydrochlorothiazide (HYDRODIURIL) 25 MG tablet Take 25 mg by mouth daily.    . naproxen sodium (ANAPROX) 220 MG tablet Take 220 mg by mouth 2 (two)  times daily with a meal.    . ramipril (ALTACE) 5 MG capsule Take 1 capsule (5 mg total) by mouth daily. 90 capsule 2  . sodium chloride (MURO 128) 5 % ophthalmic ointment Place 1 drop into the left eye at bedtime.     No current facility-administered medications on file prior to visit.    Allergies  Allergen Reactions  . Codeine Other (See Comments)    Severe headaches   . Tetanus Antitoxin   . Tetanus Toxoids Swelling and Other (See Comments)    fever  . Penicillins Rash   Social History   Socioeconomic History  . Marital status: Married    Spouse name: Not on file  . Number of children: Not on file  . Years of education: Not on file  . Highest education level: Not on file  Occupational History  . Not on file  Social Needs  . Financial resource strain: Not on file  . Food insecurity:    Worry: Not on file    Inability: Not on file  . Transportation needs:    Medical: Not on file    Non-medical: Not on file  Tobacco Use  . Smoking status: Never Smoker  . Smokeless tobacco: Never Used  Substance and Sexual Activity  . Alcohol  use: No  . Drug use: No  . Sexual activity: Not on file  Lifestyle  . Physical activity:    Days per week: Not on file    Minutes per session: Not on file  . Stress: Not on file  Relationships  . Social connections:    Talks on phone: Not on file    Gets together: Not on file    Attends religious service: Not on file    Active member of club or organization: Not on file    Attends meetings of clubs or organizations: Not on file    Relationship status: Not on file  . Intimate partner violence:    Fear of current or ex partner: Not on file    Emotionally abused: Not on file    Physically abused: Not on file    Forced sexual activity: Not on file  Other Topics Concern  . Not on file  Social History Narrative  . Not on file      Review of Systems  All other systems reviewed and are negative.      Objective:   Physical Exam    Neck: No JVD present.  Cardiovascular: Normal rate, regular rhythm and normal heart sounds.  No murmur heard. Pulmonary/Chest: Effort normal and breath sounds normal. No stridor. No respiratory distress. She has no wheezes. She has no rales.  Musculoskeletal: She exhibits edema.  Skin:      Chronic venous stasis changes in both legs with pitting edema in both legs and a 1 cm to 1.5 cm yellow vesicle on the anterior surface of the right shin       Assessment & Plan:  Chronic venous insufficiency  Tinea hydrochlorothiazide.  Recommended compression hose versus an Unna boot as well as Lasix 40 mg a day.  Patient elects to use compression hose and Lasix 40 mg a day and elevate her legs as much as possible and recheck later this week.  If not improving we will switch to an [employer].  Recheck next week even if better to recheck renal function on Lasix

## 2018-04-28 NOTE — Addendum Note (Signed)
Addended by: Shary Decamp B on: 04/28/2018 11:44 AM   Modules accepted: Orders

## 2018-05-05 ENCOUNTER — Ambulatory Visit (INDEPENDENT_AMBULATORY_CARE_PROVIDER_SITE_OTHER): Payer: BLUE CROSS/BLUE SHIELD | Admitting: Family Medicine

## 2018-05-05 ENCOUNTER — Encounter: Payer: Self-pay | Admitting: Family Medicine

## 2018-05-05 VITALS — BP 122/80 | HR 83 | Temp 97.9°F | Resp 18 | Ht 64.0 in | Wt 276.0 lb

## 2018-05-05 DIAGNOSIS — G609 Hereditary and idiopathic neuropathy, unspecified: Secondary | ICD-10-CM | POA: Diagnosis not present

## 2018-05-05 DIAGNOSIS — M7989 Other specified soft tissue disorders: Secondary | ICD-10-CM

## 2018-05-05 DIAGNOSIS — G6289 Other specified polyneuropathies: Secondary | ICD-10-CM | POA: Diagnosis not present

## 2018-05-05 DIAGNOSIS — R6889 Other general symptoms and signs: Secondary | ICD-10-CM | POA: Diagnosis not present

## 2018-05-05 DIAGNOSIS — Z1329 Encounter for screening for other suspected endocrine disorder: Secondary | ICD-10-CM | POA: Diagnosis not present

## 2018-05-05 NOTE — Progress Notes (Signed)
Subjective:    Patient ID: Jessica [name], female    DOB: February 28, 1956, 62 y.o.   MRN: [id number]  HPI  04/28/18 Patient has a history of chronic venous insufficiency and numerous moderate to large varicose veins.  She also has chronic venous stasis dermatitis on the shins of both 6.  She has +1 pitting edema in both ankles.  Recently she has developed a vesicle that is approximately 1 cm in diameter on the anterior surface of her right shin.  Around the vesicle, there is +2 pitting edema.  I believe the vesicle is due to worsening edema secondary to her chronic venous insufficiency.  There is no other rash anywhere else on the body.  There is no erythema.  There is no warmth.  There is no pain.  At that time, my plan was: Stop hydrochlorothiazide.  Recommended compression hose versus an Unna boot as well as Lasix 40 mg a day.  Patient elects to use compression hose and Lasix 40 mg a day and elevate her legs as much as possible and recheck later this week.  If not improving we will switch to an [employer].  Recheck next week even if better to recheck renal function on Lasix  05/05/18 Patient's weight is down 1 pound from last week however the swelling has improved markedly in both legs.  The blister on her right shin is slowly resolving.  It is now flat and even with the surface although there is some separation of the epidermis from the underlying layer.  There is no weeping edema.  She is taking Lasix without difficulty.  She is also compliant with her compression hose which seem to be helping.  However, in retrospect, the patient does report some mild shortness of breath as well as hot flashes.  She is concerned about underlying heart disease.  She denies any chest pain however she does have some dyspnea on exertion.  She also is reporting burning stinging pain in both feet.  Is primarily on the dorsums of both feet.  Last lab work was a year ago and showed a slightly elevated blood sugar of 108 Past  Medical History:  Diagnosis Date  . Arthritis   . Asthma   . Chronic venous insufficiency   . GERD (gastroesophageal reflux disease)   . Hypertension    Past Surgical History:  Procedure Laterality Date  . ABDOMINAL HYSTERECTOMY    . COLONOSCOPY  06/05/2012   Procedure: COLONOSCOPY;  Surgeon: Rogene Houston, MD;  Location: [facility];  Service: Endoscopy;  Laterality: N/A;  125-changed to 1225 Ann to notify pt  . NISSEN FUNDOPLICATION     x2   Current Outpatient Medications on File Prior to Visit  Medication Sig Dispense Refill  . albuterol (PROAIR HFA) 108 (90 Base) MCG/ACT inhaler Inhale 2 puffs every 6 (six) hours as needed into the lungs for wheezing or shortness of breath.    . calcium carbonate (OS-CAL) 600 MG TABS Take 600 mg by mouth daily.    . cetirizine (ZYRTEC) 10 MG tablet Take 10 mg daily by mouth.    . cholecalciferol (VITAMIN D) 1000 UNITS tablet Take 1,000 Units by mouth daily.    . Coenzyme Q10-Vitamin E (QUNOL ULTRA COQ10 PO) Take by mouth.    . esomeprazole (NEXIUM) 40 MG capsule Take 40 mg by mouth daily before breakfast.    . furosemide (LASIX) 40 MG tablet Take 1 tablet (40 mg total) by mouth daily. 30 tablet 0  .  naproxen sodium (ANAPROX) 220 MG tablet Take 220 mg by mouth 2 (two) times daily with a meal.    . ramipril (ALTACE) 5 MG capsule Take 1 capsule (5 mg total) by mouth daily. 90 capsule 2  . sodium chloride (MURO 128) 5 % ophthalmic ointment Place 1 drop into the left eye at bedtime.     No current facility-administered medications on file prior to visit.    Allergies  Allergen Reactions  . Codeine Other (See Comments)    Severe headaches   . Tetanus Antitoxin   . Tetanus Toxoids Swelling and Other (See Comments)    fever  . Penicillins Rash   Social History   Socioeconomic History  . Marital status: Married    Spouse name: Not on file  . Number of children: Not on file  . Years of education: Not on file  . Highest education level:  Not on file  Occupational History  . Not on file  Social Needs  . Financial resource strain: Not on file  . Food insecurity:    Worry: Not on file    Inability: Not on file  . Transportation needs:    Medical: Not on file    Non-medical: Not on file  Tobacco Use  . Smoking status: Never Smoker  . Smokeless tobacco: Never Used  Substance and Sexual Activity  . Alcohol use: No  . Drug use: No  . Sexual activity: Not on file  Lifestyle  . Physical activity:    Days per week: Not on file    Minutes per session: Not on file  . Stress: Not on file  Relationships  . Social connections:    Talks on phone: Not on file    Gets together: Not on file    Attends religious service: Not on file    Active member of club or organization: Not on file    Attends meetings of clubs or organizations: Not on file    Relationship status: Not on file  . Intimate partner violence:    Fear of current or ex partner: Not on file    Emotionally abused: Not on file    Physically abused: Not on file    Forced sexual activity: Not on file  Other Topics Concern  . Not on file  Social History Narrative  . Not on file      Review of Systems  All other systems reviewed and are negative.      Objective:   Physical Exam  Neck: No JVD present.  Cardiovascular: Normal rate, regular rhythm and normal heart sounds.  No murmur heard. Pulmonary/Chest: Effort normal and breath sounds normal. No stridor. No respiratory distress. She has no wheezes. She has no rales.  Musculoskeletal: She exhibits edema.  Skin:      Chronic venous stasis changes in both legs.  Edema has resolved       Assessment & Plan:  Other polyneuropathy - Plan: Hemoglobin A1c, COMPLETE METABOLIC PANEL WITH GFR, Vitamin B12, TSH  Leg swelling - Plan: ECHOCARDIOGRAM COMPLETE  Given the burning stinging pain in both feet, I will work the patient up for peripheral neuropathy with a TSH, vitamin B12, and a hemoglobin A1c.  Legs  seem to be doing better on the combination of compression hose and Lasix.  Therefore of asked the patient to discontinue hydrochlorothiazide permanently and replace with Lasix 40 mg a day.  However I will check her renal function as well as her potassium to ensure that  this is not causing her any acute kidney injury.  I will also schedule the patient for an echocardiogram.  I am concerned about possible diastolic dysfunction given body habitus and significant swelling in her legs.  Right-sided heart failure is also a possibility.

## 2018-05-06 LAB — COMPLETE METABOLIC PANEL WITH GFR
AG Ratio: 1.5 (calc) (ref 1.0–2.5)
ALKALINE PHOSPHATASE (APISO): 90 U/L (ref 33–130)
ALT: 15 U/L (ref 6–29)
AST: 16 U/L (ref 10–35)
Albumin: 4.1 g/dL (ref 3.6–5.1)
BUN: 15 mg/dL (ref 7–25)
CO2: 28 mmol/L (ref 20–32)
CREATININE: 0.82 mg/dL (ref 0.50–0.99)
Calcium: 9.4 mg/dL (ref 8.6–10.4)
Chloride: 103 mmol/L (ref 98–110)
GFR, Est African American: 88 mL/min/{1.73_m2} (ref >=60)
GFR, Est Non African American: 76 mL/min/{1.73_m2} (ref >=60)
GLUCOSE: 95 mg/dL (ref 65–99)
Globulin: 2.8 g/dL (calc) (ref 1.9–3.7)
Potassium: 4.5 mmol/L (ref 3.5–5.3)
Sodium: 140 mmol/L (ref 135–146)
Total Bilirubin: 0.3 mg/dL (ref 0.2–1.2)
Total Protein: 6.9 g/dL (ref 6.1–8.1)

## 2018-05-06 LAB — TSH: TSH: 2.38 m[IU]/L (ref 0.40–4.50)

## 2018-05-06 LAB — HEMOGLOBIN A1C
Hgb A1c MFr Bld: 5.8 % of total Hgb — ABNORMAL HIGH (ref <5.7)
Mean Plasma Glucose: 120 (calc)
eAG (mmol/L): 6.6 (calc)

## 2018-05-06 LAB — VITAMIN B12: VITAMIN B 12: 519 pg/mL (ref 200–1100)

## 2018-05-07 ENCOUNTER — Encounter: Payer: Self-pay | Admitting: *Deleted

## 2018-05-12 ENCOUNTER — Ambulatory Visit (HOSPITAL_COMMUNITY): Payer: BLUE CROSS/BLUE SHIELD | Attending: Cardiovascular Disease

## 2018-05-12 ENCOUNTER — Other Ambulatory Visit: Payer: Self-pay

## 2018-05-12 DIAGNOSIS — M7989 Other specified soft tissue disorders: Secondary | ICD-10-CM

## 2018-06-03 ENCOUNTER — Other Ambulatory Visit: Payer: Self-pay | Admitting: Family Medicine

## 2018-06-05 ENCOUNTER — Other Ambulatory Visit: Payer: Self-pay | Admitting: Family Medicine

## 2018-06-05 MED ORDER — FUROSEMIDE 40 MG PO TABS: 40.0000 mg | ORAL_TABLET | Freq: Every day | ORAL | 1 refills | Status: DC

## 2018-06-19 ENCOUNTER — Other Ambulatory Visit: Payer: Self-pay

## 2018-06-19 ENCOUNTER — Ambulatory Visit (INDEPENDENT_AMBULATORY_CARE_PROVIDER_SITE_OTHER): Payer: BLUE CROSS/BLUE SHIELD | Admitting: Family Medicine

## 2018-06-19 ENCOUNTER — Encounter: Payer: Self-pay | Admitting: Family Medicine

## 2018-06-19 VITALS — BP 124/64 | HR 72 | Temp 97.9°F | Resp 16 | Ht 64.0 in | Wt 279.0 lb

## 2018-06-19 DIAGNOSIS — J069 Acute upper respiratory infection, unspecified: Secondary | ICD-10-CM | POA: Diagnosis not present

## 2018-06-19 DIAGNOSIS — J209 Acute bronchitis, unspecified: Secondary | ICD-10-CM

## 2018-06-19 MED ORDER — FLUTICASONE PROPIONATE 50 MCG/ACT NA SUSP: 2.0000 | Freq: Every day | NASAL | 2 refills | Status: DC

## 2018-06-19 MED ORDER — ALBUTEROL SULFATE HFA 108 (90 BASE) MCG/ACT IN AERS: 2.0000 | INHALATION_SPRAY | RESPIRATORY_TRACT | 0 refills | Status: DC | PRN

## 2018-06-19 MED ORDER — BENZONATATE 100 MG PO CAPS: 100.0000 mg | ORAL_CAPSULE | Freq: Three times a day (TID) | ORAL | 0 refills | Status: DC | PRN

## 2018-06-19 MED ORDER — AZITHROMYCIN 250 MG PO TABS: ORAL_TABLET | ORAL | 0 refills | Status: DC

## 2018-06-19 MED ORDER — PREDNISONE 20 MG PO TABS: 40.0000 mg | ORAL_TABLET | Freq: Every day | ORAL | 0 refills | Status: AC

## 2018-06-19 NOTE — Progress Notes (Signed)
Patient ID: Jessica [name], female    DOB: April 23, 1956, 62 y.o.   MRN: [id number]  PCP: Jessica Frizzle, MD  Chief Complaint  Patient presents with  . Illness    x3 days- sinus pressure, ear pressure, sore throat, post nasal drip, productive cough    Subjective:   Jessica [name] is a 62 y.o. female, presents to clinic with CC of 3 days of nasal discharge, nasal congestion, postnasal drip, sore throat and productive cough.  Sputum is described as "milky" and sometimes yellow.  She feels mildly ill but denies any fever, sweats, chills, chest pain, shortness of breath.  She is concerned about severe bronchitis that she had last year around this time which lasted nearly 6 weeks and "almost turned into pneumonia."  She does not have any other history of lung disease or asthma and she had casual intermittent smoking as a teenager.  She endorses associated wheeze when she coughs, ear congestion on and off, body aches and hot and cold chills.  No improvement with Mucinex and with cold and flu medications.  She is history of hypertension, GERD and dependent edema she denies any history of heart failure.  He denies night sweats, orthopnea, palpitations, worsening lower extremity edema, weight gain, fatigue, near syncope or exertional chest pain or shortness of breath.  She would like a refill of the inhaler.  Her cough is worse at night so she would also like some cough medication.  Remembers a clear liquid cough syrup that helped before and also Tessalon Perles.   There are no active problems to display for this patient.    Prior to Admission medications   Medication Sig Start Date End Date Taking? Authorizing Provider  calcium carbonate (OS-CAL) 600 MG TABS Take 600 mg by mouth daily.   Yes [provider]  cetirizine (ZYRTEC) 10 MG tablet Take 10 mg daily by mouth.   Yes [provider]  cholecalciferol (VITAMIN D) 1000 UNITS tablet Take 1,000 Units by mouth daily.   Yes  [provider]  Coenzyme Q10-Vitamin E (QUNOL ULTRA COQ10 PO) Take by mouth.   Yes [provider]  esomeprazole (NEXIUM) 40 MG capsule Take 40 mg by mouth daily before breakfast.   Yes [provider]  furosemide (LASIX) 40 MG tablet Take 1 tablet (40 mg total) by mouth daily. 06/05/18  Yes Jessica Frizzle, MD  naproxen sodium (ANAPROX) 220 MG tablet Take 220 mg by mouth 2 (two) times daily with a meal.   Yes [provider]  ramipril (ALTACE) 5 MG capsule Take 1 capsule (5 mg total) by mouth daily. 10/20/17  Yes Jessica Frizzle, MD  sodium chloride (MURO 128) 5 % ophthalmic ointment Place 1 drop into the left eye at bedtime.   Yes [provider]  albuterol (PROVENTIL HFA;VENTOLIN HFA) 108 (90 Base) MCG/ACT inhaler Inhale 2 puffs into the lungs every 4 (four) hours as needed for wheezing or shortness of breath. 06/19/18   Delsa Grana, PA-C  azithromycin (ZITHROMAX) 250 MG tablet Take 2 tabs (500 mg) PO q d for 1d, then take 1 tab (250 mg) PO q d for day 2-5 06/19/18   Delsa Grana, PA-C  benzonatate (TESSALON) 100 MG capsule Take 1 capsule (100 mg total) by mouth 3 (three) times daily as needed for cough. 06/19/18   Delsa Grana, PA-C  fluticasone (FLONASE) 50 MCG/ACT nasal spray Place 2 sprays into both nostrils daily. 06/19/18   Delsa Grana, PA-C  predniSONE (DELTASONE) 20 MG tablet Take 2 tablets (40 mg total) by mouth daily with breakfast for 5 days. 06/19/18 06/24/18  Delsa Grana, PA-C     Allergies  Allergen Reactions  . Codeine Other (See Comments)    Severe headaches   . Tetanus Antitoxin   . Tetanus Toxoids Swelling and Other (See Comments)    fever  . Penicillins Rash     History reviewed. No pertinent family history.   Social History   Socioeconomic History  . Marital status: Married    Spouse name: Not on file  . Number of children: Not on file  . Years of education: Not on file  . Highest education level: Not on file    Occupational History  . Not on file  Social Needs  . Financial resource strain: Not on file  . Food insecurity:    Worry: Not on file    Inability: Not on file  . Transportation needs:    Medical: Not on file    Non-medical: Not on file  Tobacco Use  . Smoking status: Never Smoker  . Smokeless tobacco: Never Used  Substance and Sexual Activity  . Alcohol use: No  . Drug use: No  . Sexual activity: Not on file  Lifestyle  . Physical activity:    Days per week: Not on file    Minutes per session: Not on file  . Stress: Not on file  Relationships  . Social connections:    Talks on phone: Not on file    Gets together: Not on file    Attends religious service: Not on file    Active member of club or organization: Not on file    Attends meetings of clubs or organizations: Not on file    Relationship status: Not on file  . Intimate partner violence:    Fear of current or ex partner: Not on file    Emotionally abused: Not on file    Physically abused: Not on file    Forced sexual activity: Not on file  Other Topics Concern  . Not on file  Social History Narrative  . Not on file     Review of Systems  Constitutional: Negative.   HENT: Negative.   Eyes: Negative.   Respiratory: Negative.   Cardiovascular: Negative.   Gastrointestinal: Negative.   Endocrine: Negative.   Genitourinary: Negative.   Musculoskeletal: Negative.   Skin: Negative.   Allergic/Immunologic: Negative.   Neurological: Negative.   Hematological: Negative.   Psychiatric/Behavioral: Negative.   All other systems reviewed and are negative.      Objective:    Vitals:   06/19/18 1117  BP: 124/64  Pulse: 72  Resp: 16  Temp: 97.9 F (36.6 C)  TempSrc: Oral  SpO2: 94%  Weight: 279 lb (126.6 kg)  Height: 5\' 4"  (1.626 m)      Physical Exam  Constitutional: She appears well-developed.  Non-toxic appearance. She does not have a sickly appearance. She does not appear ill. No distress.   Obese nontoxic-appearing female, she appears mildly ill no acute distress  HENT:  Head: Normocephalic and atraumatic.  Right Ear: Hearing, tympanic membrane, external ear and ear canal normal.  Left Ear: Hearing, tympanic membrane, external ear and ear canal normal.  Nose: Mucosal edema and rhinorrhea present. Right sinus exhibits no maxillary sinus tenderness and no frontal sinus tenderness. Left sinus exhibits no maxillary sinus tenderness and no frontal sinus tenderness.  Mouth/Throat: Uvula is midline and mucous membranes are  normal. Mucous membranes are not pale, not dry and not cyanotic. No uvula swelling. No oropharyngeal exudate, posterior oropharyngeal edema or tonsillar abscesses. No tonsillar exudate.  Mild nasal mucosa erythema and posterior oropharyngeal erythema, nasal congestion, raspy voice  Eyes: Pupils are equal, round, and reactive to light. Conjunctivae are normal. Right eye exhibits no discharge. Left eye exhibits no discharge.  Mild erythema to bilateral edge of eyelids, no associated lid edema or periorbital erythema or edema, conjunctive are normal bilaterally without discharge  Neck: Trachea normal and normal range of motion. Neck supple. No JVD present. No tracheal deviation present.  Cardiovascular: Normal rate, regular rhythm, normal heart sounds and intact distal pulses. Exam reveals no gallop and no friction rub.  No murmur heard. Pulmonary/Chest: Effort normal. No accessory muscle usage or stridor. No tachypnea. No respiratory distress. She has no decreased breath sounds. She has no wheezes. She has no rhonchi. She has no rales.  Abdominal: Soft. Bowel sounds are normal. She exhibits no distension.  Musculoskeletal: Normal range of motion.  Lymphadenopathy:    She has no cervical adenopathy.  Neurological: She is alert. She exhibits normal muscle tone. Coordination normal.  Skin: Skin is warm and dry. No rash noted. She is not diaphoretic. No pallor.  Psychiatric:  She has a normal mood and affect. Her behavior is normal.  Nursing note and vitals reviewed.         Assessment & Plan:   61 year old female presents with URI symptoms cough and wheeze, has past medical history significant for hypertension and did have a fairly severe episode of prolonged bronchitis roughly 1 year ago about this time of year.  She appears mildly ill, has had symptoms for 3 days, does not appear to have any cardiac component, appears euvolemic.  He is afebrile and has no increased work of breathing.  She does appear to have erythema edema and congestion to her sinuses throat and chest, will treat for acute bronchitis and give Z-Pak for comorbidities and histories of severe illness last year however I do think that is most likely a viral illness and I did explain to her that it may continue for a few weeks -which is not uncommon with viral illnesses/bronchitis.  Would expect her to feel much better with decreased wheeze with the oral steroids and inhaler.  If she has any prolonged severe symptoms, any worsening she was encouraged to follow-up     ICD-10-CM   1. Upper respiratory tract infection, unspecified type J06.9 fluticasone (FLONASE) 50 MCG/ACT nasal spray  2. Acute bronchitis, unspecified organism J20.9 azithromycin (ZITHROMAX) 250 MG tablet    predniSONE (DELTASONE) 20 MG tablet    albuterol (PROVENTIL HFA;VENTOLIN HFA) 108 (90 Base) MCG/ACT inhaler    benzonatate (TESSALON) 100 MG capsule       Delsa Grana, PA-C 06/19/18 11:46 AM

## 2018-06-19 NOTE — Patient Instructions (Signed)
Continue taking Mucinex daily and drink ample fluids, continue your allergy medications and consider adding on steroid nasal sprays, using saline nasal sprays, using a humidifier.  I would start taking the steroids, antibiotics and use inhaler today and as directed, can use cough medicines to help with cough suppression, as needed.  It is not uncommon to continue to have a cough for several weeks.  We would like you to be feeling largely better in the next 5 to 7 days.  If you have any sudden worsening any shortness of breath, new fever, fatigue, sweats or weight loss we do need you to come in so we can recheck immediately.   Acute Bronchitis, Adult Acute bronchitis is when air tubes (bronchi) in the lungs suddenly get swollen. The condition can make it hard to breathe. It can also cause these symptoms:  A cough.  Coughing up clear, yellow, or green mucus.  Wheezing.  Chest congestion.  Shortness of breath.  A fever.  Body aches.  Chills.  A sore throat.  Follow these instructions at home: Medicines  Take over-the-counter and prescription medicines only as told by your doctor.  If you were prescribed an antibiotic medicine, take it as told by your doctor. Do not stop taking the antibiotic even if you start to feel better. General instructions  Rest.  Drink enough fluids to keep your pee (urine) clear or pale yellow.  Avoid smoking and secondhand smoke. If you smoke and you need help quitting, ask your doctor. Quitting will help your lungs heal faster.  Use an inhaler, cool mist vaporizer, or humidifier as told by your doctor.  Keep all follow-up visits as told by your doctor. This is important. How is this prevented? To lower your risk of getting this condition again:  Wash your hands often with soap and water. If you cannot use soap and water, use hand sanitizer.  Avoid contact with people who have cold symptoms.  Try not to touch your hands to your mouth, nose, or  eyes.  Make sure to get the flu shot every year.  Contact a doctor if:  Your symptoms do not get better in 2 weeks. Get help right away if:  You cough up blood.  You have chest pain.  You have very bad shortness of breath.  You become dehydrated.  You faint (pass out) or keep feeling like you are going to pass out.  You keep throwing up (vomiting).  You have a very bad headache.  Your fever or chills gets worse. This information is not intended to replace advice given to you by your health care provider. Make sure you discuss any questions you have with your health care provider. Document Released: 12/18/2007 Document Revised: 02/07/2016 Document Reviewed: 12/20/2015 Elsevier Interactive Patient Education  [employer].

## 2018-10-06 DIAGNOSIS — Z01419 Encounter for gynecological examination (general) (routine) without abnormal findings: Secondary | ICD-10-CM | POA: Diagnosis not present

## 2018-10-06 DIAGNOSIS — Z6841 Body Mass Index (BMI) 40.0 and over, adult: Secondary | ICD-10-CM | POA: Diagnosis not present

## 2018-10-06 DIAGNOSIS — Z1231 Encounter for screening mammogram for malignant neoplasm of breast: Secondary | ICD-10-CM | POA: Diagnosis not present

## 2018-10-06 LAB — HM MAMMOGRAPHY

## 2018-10-13 ENCOUNTER — Encounter: Payer: Self-pay | Admitting: *Deleted

## 2018-10-27 ENCOUNTER — Other Ambulatory Visit: Payer: Self-pay | Admitting: Family Medicine

## 2019-02-03 ENCOUNTER — Other Ambulatory Visit: Payer: Self-pay | Admitting: Family Medicine

## 2019-04-06 ENCOUNTER — Ambulatory Visit (HOSPITAL_COMMUNITY)
Admission: RE | Admit: 2019-04-06 | Discharge: 2019-04-06 | Disposition: A | Discharge status: Home / Self Care | Payer: BLUE CROSS/BLUE SHIELD | Source: Ambulatory Visit | Attending: Family Medicine | Admitting: Family Medicine

## 2019-04-06 ENCOUNTER — Ambulatory Visit (INDEPENDENT_AMBULATORY_CARE_PROVIDER_SITE_OTHER): Payer: BC Managed Care – PPO | Admitting: Family Medicine

## 2019-04-06 ENCOUNTER — Encounter: Payer: Self-pay | Admitting: Family Medicine

## 2019-04-06 ENCOUNTER — Other Ambulatory Visit: Payer: Self-pay

## 2019-04-06 VITALS — BP 130/72 | HR 82 | Temp 98.5°F | Resp 16 | Ht 64.0 in | Wt 285.0 lb

## 2019-04-06 DIAGNOSIS — R609 Edema, unspecified: Secondary | ICD-10-CM | POA: Diagnosis not present

## 2019-04-06 DIAGNOSIS — M79605 Pain in left leg: Secondary | ICD-10-CM

## 2019-04-06 DIAGNOSIS — M25572 Pain in left ankle and joints of left foot: Secondary | ICD-10-CM

## 2019-04-06 DIAGNOSIS — M25562 Pain in left knee: Secondary | ICD-10-CM

## 2019-04-06 DIAGNOSIS — W19XXXA Unspecified fall, initial encounter: Secondary | ICD-10-CM

## 2019-04-06 DIAGNOSIS — M8580 Other specified disorders of bone density and structure, unspecified site: Secondary | ICD-10-CM | POA: Insufficient documentation

## 2019-04-06 DIAGNOSIS — I872 Venous insufficiency (chronic) (peripheral): Secondary | ICD-10-CM | POA: Insufficient documentation

## 2019-04-06 MED ORDER — TIZANIDINE HCL 4 MG PO TABS: 4.0000 mg | ORAL_TABLET | Freq: Four times a day (QID) | ORAL | 0 refills | Status: DC | PRN

## 2019-04-06 MED ORDER — CEPHALEXIN 500 MG PO CAPS: 500.0000 mg | ORAL_CAPSULE | Freq: Two times a day (BID) | ORAL | 0 refills | Status: DC

## 2019-04-06 NOTE — Patient Instructions (Addendum)
Get the xrays done Get compression hose  Take antibiotics as prescribed  F/U pending results

## 2019-04-06 NOTE — Assessment & Plan Note (Addendum)
Accidental fall with significant bruising over the knee and the anterior leg and ankle.  Best case scenario she has bruising which should resolve in the next couple weeks however we do need to rule out underlying fracture.  She still has tenderness at the ankle and some decreased range of motion in the leg and knee.  Will obtain x-ray of the ankle tib-fib and knee.  Orthopedics if there is any significant fracture noted.  Discussed pain medication she prefer to just take over-the-counter Aleve or Tylenol which is what she has been using.  I will however give her some Zanaflex for the muscle spasm.  She is to elevate the leg for swelling.  Regarding her chronic venous stasis I have given her prescription for compression hose.  Continue Lasix at current dose.

## 2019-04-06 NOTE — Progress Notes (Signed)
Subjective:    Patient ID: Jessica [name], female    DOB: 07-03-1956, 63 y.o.   MRN: [id number]  Patient presents for Discoloration (S/P fall on 9/20- LLE brusing and redness- some soreness to lower back and lower abd- most of pain in L knee and ankle)   She fell at restaurant in [place] over the weekend 9/20 , was holding drink and food,  Was walking on unlevel concrete there may have been a ditch her whole and she tripped she went down onto her left knee and leg with her ankle bent behind.  She did not seek emergency care at that time was able to get up and walk on her leg but since then has had progressive swelling and bruising.  She has some pain around the ankle and the anterior knee.  The swelling has gone down some.  She has been trying to elevate.  She has not needed any significant pain medication but does request muscle relaxer because of soreness in her legs and hips since the fall everything just feels tight.  Today she noticed redness streaking up from her lower leg she does have chronic venous stasis changes and varicose veins but this erythema and streaking is different from her baseline.  She has not had any fever or chills.  She does have abrasion on her knee from the fall   Review Of Systems:  GEN- denies fatigue, fever, weight loss,weakness, recent illness HEENT- denies eye drainage, change in vision, nasal discharge, CVS- denies chest pain, palpitations RESP- denies SOB, cough, wheeze ABD- denies N/V, change in stools, abd pain GU- denies dysuria, hematuria, dribbling, incontinence MSK- + joint pain, muscle aches, injury Neuro- denies headache, dizziness, syncope, seizure activity       Objective:    BP 130/72   Pulse 82   Temp 98.5 F (36.9 C) (Oral)   Resp 16   Ht 5\' 4"  (1.626 m)   Wt 285 lb (129.3 kg)   SpO2 96%   BMI 48.92 kg/m  GEN- NAD, alert and oriented x3 CVS- RRR, no murmur RESP-CTAB MSK- fair ROM spine, hips, knees, , decreased flexion and extension  at ankle and knee, bruising ant knee- large ecchymosis and on lower EXT- chronic venous stasis changes but has bright erythema with 2 streaking up shin, mild TTP ,1+ edema L >R, varicose veins/spider veins Pulses- Radial, DP- palpated         Assessment & Plan:      Problem List Items Addressed This Visit      Unprioritized   Peripheral edema    Accidental fall with significant bruising over the knee and the anterior leg and ankle.  Best case scenario she has bruising which should resolve in the next couple weeks however we do need to rule out underlying fracture.  She still has tenderness at the ankle and some decreased range of motion in the leg and knee.  Will obtain x-ray of the ankle tib-fib and knee.  Orthopedics if there is any significant fracture noted.  Discussed pain medication she prefer to just take over-the-counter Aleve or Tylenol which is what she has been using.  I will however give her some Zanaflex for the muscle spasm.  She is to elevate the leg for swelling.  Regarding her chronic venous stasis I have given her prescription for compression hose.  Continue Lasix at current dose.       Other Visit Diagnoses    Acute left ankle pain    -  Primary   Relevant Orders   DG Ankle Complete Left (Completed)   DG Tibia/Fibula Left (Completed)   Left anterior knee pain       Relevant Orders   DG Knee Complete 4 Views Left (Completed)   Left leg pain       Relevant Orders   DG Tibia/Fibula Left (Completed)   Accidental fall, initial encounter          Note: This dictation was prepared with Dragon dictation along with smaller phrase technology. Any transcriptional errors that result from this process are unintentional.

## 2019-04-12 ENCOUNTER — Ambulatory Visit (INDEPENDENT_AMBULATORY_CARE_PROVIDER_SITE_OTHER): Payer: BC Managed Care – PPO | Admitting: Family Medicine

## 2019-04-12 ENCOUNTER — Other Ambulatory Visit: Payer: Self-pay

## 2019-04-12 ENCOUNTER — Encounter: Payer: Self-pay | Admitting: Family Medicine

## 2019-04-12 VITALS — BP 124/80 | HR 91 | Temp 97.7°F | Resp 18 | Ht 64.0 in | Wt 285.0 lb

## 2019-04-12 DIAGNOSIS — I10 Essential (primary) hypertension: Secondary | ICD-10-CM | POA: Diagnosis not present

## 2019-04-12 DIAGNOSIS — E7439 Other disorders of intestinal carbohydrate absorption: Secondary | ICD-10-CM

## 2019-04-12 DIAGNOSIS — I872 Venous insufficiency (chronic) (peripheral): Secondary | ICD-10-CM | POA: Diagnosis not present

## 2019-04-12 DIAGNOSIS — L03116 Cellulitis of left lower limb: Secondary | ICD-10-CM

## 2019-04-12 MED ORDER — CLINDAMYCIN HCL 300 MG PO CAPS: 300.0000 mg | ORAL_CAPSULE | Freq: Three times a day (TID) | ORAL | 0 refills | Status: DC

## 2019-04-12 NOTE — Assessment & Plan Note (Addendum)
Pressure is controlled.  She is also glucose intolerance and with her weight concerning that she may be diabetic so we will obtain A1c as well.  Concerned that her cellulitis is not improving but she has increased streaking.  Will obtain a CBC but will go ahead and switch her over to clindamycin as she did not tolerate sulfa drugs in the past due to GI upset.  I will have her come back in 48 hours for recheck.  If this is not improving then I am going to route her to the emergency room.  He did have an abrasion on the knee which could have been the site of infection and she fell outdoors.  Chronic venous stasis changes recommend her taking her Lasix at least for the next 3 days then using as needed.  She is unable to get her compression hose on in general.

## 2019-04-12 NOTE — Progress Notes (Signed)
Subjective:    Patient ID: Jessica [name], female    DOB: 12-24-1955, 63 y.o.   MRN: [id number]  Patient presents for Edema (follow up in leg, needing another round of antibiotics)  Patient here to follow-up cellulitis of her left lower extremity since she had her fall.  X-ray shows subcutaneous changes consistent with cellulitis.  She also had some streaking on the left lower leg and some warmth that has actually persisted and the streaking is now gone up towards the knee area.  She has not had any fever or chills no vomiting or other systemic symptoms.  She did finish up her Keflex yesterday.  She has not had any weeping of the skin.  Her ankle pain has improved she did not have any acute  fractures noted on any of the x-rays  She has not been taking her Lasix states that she only uses as needed has not used in quite some time. Sulfa caused nausea on stomch   Review Of Systems:  GEN- denies fatigue, fever, weight loss,weakness, recent illness HEENT- denies eye drainage, change in vision, nasal discharge, CVS- denies chest pain, palpitations RESP- denies SOB, cough, wheeze ABD- denies N/V, change in stools, abd pain GU- denies dysuria, hematuria, dribbling, incontinence MSK- + joint pain, muscle aches, injury Neuro- denies headache, dizziness, syncope, seizure activity       Objective:    BP 124/80 (BP Location: Right Arm, Patient Position: Sitting, Cuff Size: Large)   Pulse 91   Temp 97.7 F (36.5 C) (Oral)   Resp 18   Ht 5\' 4"  (1.626 m)   Wt 285 lb (129.3 kg)   SpO2 98%   BMI 48.92 kg/m  GEN- NAD, alert and oriented x3 CVS- RRR, no murmur RESP-CTAB RESP-CTAB MSK- fair ROM spine, hips, knees, , decreased flexion and extension at ankle and knee, bruising ant knee- large ecchymosis on left lower leg EXT- chronic venous stasis changes but has bright erythema with 2 streaking up shin to left knee, mild warmth , mild TTP ,1+ edema L >R, varicose veins/spider veins Pulses-  Radial, DP- palpated        Assessment & Plan:      Problem List Items Addressed This Visit      Unprioritized   Chronic venous insufficiency   Glucose intolerance   Relevant Orders   Hemoglobin A1c   Hypertension    Pressure is controlled.  She is also glucose intolerance and with her weight concerning that she may be diabetic so we will obtain A1c as well.  Concerned that her cellulitis is not improving but she has increased streaking.  Will obtain a CBC but will go ahead and switch her over to clindamycin as she did not tolerate sulfa drugs in the past due to GI upset.  I will have her come back in 48 hours for recheck.  If this is not improving then I am going to route her to the emergency room.  He did have an abrasion on the knee which could have been the site of infection and she fell outdoors.  Chronic venous stasis changes recommend her taking her Lasix at least for the next 3 days then using as needed.  She is unable to get her compression hose on in general.       Other Visit Diagnoses    Cellulitis of left lower leg    -  Primary   Relevant Orders   Comprehensive metabolic panel   CBC with Differential/Platelet  Note: This dictation was prepared with Dragon dictation along with smaller phrase technology. Any transcriptional errors that result from this process are unintentional.

## 2019-04-12 NOTE — Patient Instructions (Signed)
F/U Wed AM at  8am for recheck on legs

## 2019-04-13 LAB — CBC WITH DIFFERENTIAL/PLATELET
Absolute Monocytes: 680 cells/uL (ref 200–950)
Basophils Absolute: 42 cells/uL (ref 0–200)
Basophils Relative: 0.5 %
Eosinophils Absolute: 160 cells/uL (ref 15–500)
Eosinophils Relative: 1.9 %
HCT: 41.6 % (ref 35.0–45.0)
Hemoglobin: 13.3 g/dL (ref 11.7–15.5)
Lymphs Abs: 2797 cells/uL (ref 850–3900)
MCH: 27.1 pg (ref 27.0–33.0)
MCHC: 32 g/dL (ref 32.0–36.0)
MCV: 84.9 fL (ref 80.0–100.0)
MPV: 12 fL (ref 7.5–12.5)
Monocytes Relative: 8.1 %
Neutro Abs: 4721 cells/uL (ref 1500–7800)
Neutrophils Relative %: 56.2 %
Platelets: 224 10*3/uL (ref 140–400)
RBC: 4.9 10*6/uL (ref 3.80–5.10)
RDW: 13.6 % (ref 11.0–15.0)
Total Lymphocyte: 33.3 %
WBC: 8.4 10*3/uL (ref 3.8–10.8)

## 2019-04-13 LAB — COMPREHENSIVE METABOLIC PANEL
AG Ratio: 1.1 (calc) (ref 1.0–2.5)
ALT: 14 U/L (ref 6–29)
AST: 16 U/L (ref 10–35)
Albumin: 3.9 g/dL (ref 3.6–5.1)
Alkaline phosphatase (APISO): 104 U/L (ref 37–153)
BUN: 19 mg/dL (ref 7–25)
CO2: 27 mmol/L (ref 20–32)
Calcium: 9.6 mg/dL (ref 8.6–10.4)
Chloride: 104 mmol/L (ref 98–110)
Creat: 0.97 mg/dL (ref 0.50–0.99)
Globulin: 3.4 g/dL (calc) (ref 1.9–3.7)
Glucose, Bld: 101 mg/dL — ABNORMAL HIGH (ref 65–99)
Potassium: 4.6 mmol/L (ref 3.5–5.3)
Sodium: 140 mmol/L (ref 135–146)
Total Bilirubin: 0.6 mg/dL (ref 0.2–1.2)
Total Protein: 7.3 g/dL (ref 6.1–8.1)

## 2019-04-13 LAB — HEMOGLOBIN A1C
Hgb A1c MFr Bld: 5.8 % of total Hgb — ABNORMAL HIGH (ref <5.7)
Mean Plasma Glucose: 120 (calc)
eAG (mmol/L): 6.6 (calc)

## 2019-04-14 ENCOUNTER — Other Ambulatory Visit: Payer: Self-pay

## 2019-04-14 ENCOUNTER — Ambulatory Visit (INDEPENDENT_AMBULATORY_CARE_PROVIDER_SITE_OTHER): Payer: BC Managed Care – PPO | Admitting: Family Medicine

## 2019-04-14 ENCOUNTER — Encounter: Payer: Self-pay | Admitting: Family Medicine

## 2019-04-14 VITALS — BP 128/72 | HR 82 | Temp 98.2°F | Resp 14 | Ht 64.0 in | Wt 283.0 lb

## 2019-04-14 DIAGNOSIS — L03116 Cellulitis of left lower limb: Secondary | ICD-10-CM | POA: Diagnosis not present

## 2019-04-14 NOTE — Patient Instructions (Signed)
Call for any concerns Keep benadryl on hand  F/U as needed

## 2019-04-14 NOTE — Progress Notes (Signed)
   Subjective:    Patient ID: Jessica [name], female    DOB: 1955-07-23, 63 y.o.   MRN: [id number]  Patient presents for Follow-up (cellulitis of leg- some improvement, continues to be red and warm to touch- edema has improved- has had some itchiness in face and ears with new ABTx- no SOB, facial edema)  Patient here for 48-hour interim follow-up on her cellulitis of her left lower leg which stems from a recent fall.  She was put on clindamycin she had problems with sulfa drugs and she had recently been on Keflex with no improvement.  Her labs were drawn which were unremarkable CBC was normal.  She does have borderline diabetes.  Is not had any fever or chills but does state that she has had some itchiness in her face and ears yesterday, but not consistent. She has  Not had any swelling of the face or tongue no difficulty breathing.  The heat and swelling has improved, still has the erythema but it has not worsened  She has been icing and elevating and taking her lasix   Review Of Systems:  GEN- denies fatigue, fever, weight loss,weakness, recent illness HEENT- denies eye drainage, change in vision, nasal discharge, CVS- denies chest pain, palpitations RESP- denies SOB, cough, wheeze ABD- denies N/V, change in stools, abd pain GU- denies dysuria, hematuria, dribbling, incontinence MSK- denies joint pain, muscle aches, injury Neuro- denies headache, dizziness, syncope, seizure activity       Objective:    BP 128/72   Pulse 82   Temp 98.2 F (36.8 C) (Oral)   Resp 14   Ht 5\' 4"  (1.626 m)   Wt 283 lb (128.4 kg)   SpO2 98%   BMI 48.58 kg/m  GEN- NAD, alert and oriented x3 HEENT- EOMI, non icteric, uvula midline, oropharynx clear, no angioedema, MMM CVS- RRR, no murmur RESP-CTAB  , bruising ant knee- decreased ecchymosis on left lower leg EXT- chronic venous stasis changes but has decreased erythema with 2 streaking up shin to left knee, mild warmth , NT  ,decreased left leg  Edema  , varicose veins/spider veins Pulses- Radial, DP- palpated        Assessment & Plan:      Problem List Items Addressed This Visit    None    Visit Diagnoses    Cellulitis of left lower leg    -  Primary   Cellulitis is improved some, will continue clindamy, itching is  not constant and no rash since being on the antibiotics, will keep benadryl on hand, if she does breakout or have swelling, will d/c and change to doxycycline. REVIEWED her labs at bedside, CBC normal Pt to continue to monitor leg at home      Note: This dictation was prepared with Dragon dictation along with smaller phrase technology. Any transcriptional errors that result from this process are unintentional.

## 2019-04-15 ENCOUNTER — Ambulatory Visit: Payer: BC Managed Care – PPO | Admitting: Family Medicine

## 2019-04-23 ENCOUNTER — Encounter: Payer: Self-pay | Admitting: Family Medicine

## 2019-04-23 ENCOUNTER — Other Ambulatory Visit: Payer: Self-pay

## 2019-04-23 ENCOUNTER — Ambulatory Visit (INDEPENDENT_AMBULATORY_CARE_PROVIDER_SITE_OTHER): Payer: BC Managed Care – PPO | Admitting: Family Medicine

## 2019-04-23 VITALS — BP 120/80 | HR 78 | Temp 97.1°F | Resp 18 | Ht 64.0 in | Wt 282.0 lb

## 2019-04-23 DIAGNOSIS — I872 Venous insufficiency (chronic) (peripheral): Secondary | ICD-10-CM

## 2019-04-23 DIAGNOSIS — L03116 Cellulitis of left lower limb: Secondary | ICD-10-CM | POA: Diagnosis not present

## 2019-04-23 MED ORDER — DOXYCYCLINE HYCLATE 100 MG PO TABS: 100.0000 mg | ORAL_TABLET | Freq: Two times a day (BID) | ORAL | 0 refills | Status: DC

## 2019-04-23 MED ORDER — TRIAMCINOLONE ACETONIDE 0.1 % EX CREA: 1.0000 "application " | TOPICAL_CREAM | Freq: Two times a day (BID) | CUTANEOUS | 0 refills | Status: DC

## 2019-04-23 NOTE — Progress Notes (Signed)
Subjective:    Patient ID: Jessica [name], female    DOB: [birth year], 63 y.o.   MRN: [id number]  HPI Patient has a history of chronic venous insufficiency and numerous moderate to large varicose veins.  She also has chronic venous stasis dermatitis on both shins.  Was recently treated for cellulitis on the left leg by my partner with clindamycin.    Although better, the left leg is still swollen.  There is still erythema streaking superiorly from the chronic venous stasis changes.  It is still warm and the skin there is still tender.  The question is how much of this could be venous stasis dermatitis exacerbated by recent swelling and how much is residual cellulitis.  The patient believes that the redness has improved after taking the clindamycin.  The tenderness has also improved.  She has a negative Homans sign.  There is no significant pitting edema. Past Medical History:  Diagnosis Date  . Arthritis   . Asthma   . Chronic venous insufficiency   . GERD (gastroesophageal reflux disease)   . Hypertension    Past Surgical History:  Procedure Laterality Date  . ABDOMINAL HYSTERECTOMY    . COLONOSCOPY  06/05/2012   Procedure: COLONOSCOPY;  Surgeon: Rogene Houston, MD;  Location: [facility];  Service: Endoscopy;  Laterality: N/A;  125-changed to 1225 Ann to notify pt  . NISSEN FUNDOPLICATION     x2   Current Outpatient Medications on File Prior to Visit  Medication Sig Dispense Refill  . albuterol (PROVENTIL HFA;VENTOLIN HFA) 108 (90 Base) MCG/ACT inhaler Inhale 2 puffs into the lungs every 4 (four) hours as needed for wheezing or shortness of breath. 1 Inhaler 0  . calcium carbonate (OS-CAL) 600 MG TABS Take 600 mg by mouth daily.    . cetirizine (ZYRTEC) 10 MG tablet Take 10 mg daily by mouth.    . cholecalciferol (VITAMIN D) 1000 UNITS tablet Take 1,000 Units by mouth daily.    . Coenzyme Q10-Vitamin E (QUNOL ULTRA COQ10 PO) Take by mouth.    . esomeprazole (NEXIUM) 40 MG  capsule Take 40 mg by mouth daily before breakfast.    . fluticasone (FLONASE) 50 MCG/ACT nasal spray Place 2 sprays into both nostrils daily. 16 g 2  . furosemide (LASIX) 40 MG tablet Take 1 tablet (40 mg total) by mouth daily. 90 tablet 1  . naproxen sodium (ANAPROX) 220 MG tablet Take 220 mg by mouth 2 (two) times daily with a meal.    . ramipril (ALTACE) 5 MG capsule Take 1 capsule by mouth once daily 90 capsule 2  . sodium chloride (MURO 128) 5 % ophthalmic ointment Place 1 drop into the left eye at bedtime.    Marland Kitchen tiZANidine (ZANAFLEX) 4 MG tablet Take 1 tablet (4 mg total) by mouth every 6 (six) hours as needed for muscle spasms. 20 tablet 0   No current facility-administered medications on file prior to visit.    Allergies  Allergen Reactions  . Codeine Other (See Comments)    Severe headaches   . Tetanus Antitoxin   . Tetanus Toxoids Swelling and Other (See Comments)    fever  . Penicillins Rash   Social History   Socioeconomic History  . Marital status: Married    Spouse name: Not on file  . Number of children: Not on file  . Years of education: Not on file  . Highest education level: Not on file  Occupational History  . Not on  file  Social Needs  . Financial resource strain: Not on file  . Food insecurity    Worry: Not on file    Inability: Not on file  . Transportation needs    Medical: Not on file    Non-medical: Not on file  Tobacco Use  . Smoking status: Never Smoker  . Smokeless tobacco: Never Used  Substance and Sexual Activity  . Alcohol use: No  . Drug use: No  . Sexual activity: Not on file  Lifestyle  . Physical activity    Days per week: Not on file    Minutes per session: Not on file  . Stress: Not on file  Relationships  . Social Herbalist on phone: Not on file    Gets together: Not on file    Attends religious service: Not on file    Active member of club or organization: Not on file    Attends meetings of clubs or  organizations: Not on file    Relationship status: Not on file  . Intimate partner violence    Fear of current or ex partner: Not on file    Emotionally abused: Not on file    Physically abused: Not on file    Forced sexual activity: Not on file  Other Topics Concern  . Not on file  Social History Narrative  . Not on file      Review of Systems  All other systems reviewed and are negative.      Objective:   Physical Exam Neck:     Vascular: No JVD.  Cardiovascular:     Rate and Rhythm: Normal rate and regular rhythm.     Heart sounds: Normal heart sounds. No murmur.  Pulmonary:     Effort: Pulmonary effort is normal. No respiratory distress.     Breath sounds: Normal breath sounds. No stridor. No wheezing or rales.  Musculoskeletal:     Right lower leg: Edema present.     Left lower leg: Edema present.  Skin:    Findings: Erythema and rash present.          Assessment & Plan:  Cellulitis of left lower leg  Chronic venous insufficiency  I believe that the cellulitis has improved.  I believe the majority of the erythema now may be venous stasis dermatitis and irritation due to swelling.  However I cannot rule out residual cellulitis.  Therefore as I explained to the patient, I would treat both possibilities.  I would start her on doxycycline 100 mg twice daily to cover both staph and strep as she seemed to improve after clindamycin more than Keflex suggesting possible MRSA.  I would also treat the itching and irritation to the skin with triamcinolone cream twice daily and encourage the patient to wear compression hose to help combat the swelling and chronic venous insufficiency which is likely contributing to the skin irritation.

## 2019-06-23 ENCOUNTER — Other Ambulatory Visit: Payer: Self-pay

## 2019-06-23 DIAGNOSIS — Z20822 Contact with and (suspected) exposure to covid-19: Secondary | ICD-10-CM

## 2019-06-24 LAB — NOVEL CORONAVIRUS, NAA: SARS-CoV-2, NAA: NOT DETECTED

## 2019-07-02 ENCOUNTER — Ambulatory Visit: Payer: Self-pay | Attending: Internal Medicine

## 2019-07-02 ENCOUNTER — Other Ambulatory Visit: Payer: Self-pay

## 2019-07-02 ENCOUNTER — Encounter: Payer: Self-pay | Admitting: Family Medicine

## 2019-07-02 DIAGNOSIS — Z20822 Contact with and (suspected) exposure to covid-19: Secondary | ICD-10-CM

## 2019-07-02 DIAGNOSIS — Z20828 Contact with and (suspected) exposure to other viral communicable diseases: Secondary | ICD-10-CM | POA: Insufficient documentation

## 2019-07-03 LAB — NOVEL CORONAVIRUS, NAA: SARS-CoV-2, NAA: NOT DETECTED

## 2019-08-12 ENCOUNTER — Encounter: Payer: Self-pay | Admitting: Family Medicine

## 2019-10-01 ENCOUNTER — Ambulatory Visit (HOSPITAL_COMMUNITY)
Admission: RE | Admit: 2019-10-01 | Discharge: 2019-10-01 | Disposition: A | Discharge status: Home / Self Care | Payer: Medicare Other | Source: Ambulatory Visit | Attending: Family Medicine | Admitting: Family Medicine

## 2019-10-01 ENCOUNTER — Other Ambulatory Visit: Payer: Self-pay

## 2019-10-01 ENCOUNTER — Ambulatory Visit (INDEPENDENT_AMBULATORY_CARE_PROVIDER_SITE_OTHER): Payer: Medicare Other | Admitting: Family Medicine

## 2019-10-01 ENCOUNTER — Encounter: Payer: Self-pay | Admitting: Family Medicine

## 2019-10-01 VITALS — BP 128/64 | HR 100 | Temp 97.9°F | Resp 14 | Ht 64.0 in | Wt 289.0 lb

## 2019-10-01 DIAGNOSIS — M25551 Pain in right hip: Secondary | ICD-10-CM

## 2019-10-01 DIAGNOSIS — M4316 Spondylolisthesis, lumbar region: Secondary | ICD-10-CM | POA: Insufficient documentation

## 2019-10-01 DIAGNOSIS — M545 Low back pain, unspecified: Secondary | ICD-10-CM

## 2019-10-01 DIAGNOSIS — W19XXXA Unspecified fall, initial encounter: Secondary | ICD-10-CM

## 2019-10-01 DIAGNOSIS — M75101 Unspecified rotator cuff tear or rupture of right shoulder, not specified as traumatic: Secondary | ICD-10-CM | POA: Diagnosis not present

## 2019-10-01 DIAGNOSIS — M25552 Pain in left hip: Secondary | ICD-10-CM | POA: Insufficient documentation

## 2019-10-01 DIAGNOSIS — M5136 Other intervertebral disc degeneration, lumbar region: Secondary | ICD-10-CM | POA: Insufficient documentation

## 2019-10-01 DIAGNOSIS — M25511 Pain in right shoulder: Secondary | ICD-10-CM | POA: Diagnosis present

## 2019-10-01 MED ORDER — HYDROCODONE-ACETAMINOPHEN 5-325 MG PO TABS: 1.0000 | ORAL_TABLET | Freq: Four times a day (QID) | ORAL | 0 refills | Status: DC | PRN

## 2019-10-01 NOTE — Patient Instructions (Addendum)
Get xrays done at [facility]  Take the pain medications  F/U pending results

## 2019-10-01 NOTE — Progress Notes (Signed)
   Subjective:    Patient ID: Jessica [name], female    DOB: 02-10-1956, 64 y.o.   MRN: [id number]  Patient presents for Pain (S/P fall on 3/18 d/t wet shoes soreness in R hip, B shoulder blade, R arm)   Yesterday was outside and shoes were wet. She slipped off the bottom step into her carport. She has underlying OA knees. She tried to catch herself, right arm was under her some during the fall She can't raise arm abpove shoulder height She has pain beneath both shoulder blades She has soreness in lower back and hips  She has does have some underlying carpal tunnel , but worsening tingling/numbness in right hand since shoulder injury   She di not injur ankle or knees  She took naprosyn last night which helped some    Review Of Systems:  GEN- denies fatigue, fever, weight loss,weakness, recent illness HEENT- denies eye drainage, change in vision, nasal discharge, CVS- denies chest pain, palpitations RESP- denies SOB, cough, wheeze ABD- denies N/V, change in stools, abd pain GU- denies dysuria, hematuria, dribbling, incontinence MSK- +joint pain, muscle aches, injury Neuro- denies headache, dizziness, syncope, seizure activity       Objective:    BP 128/64   Pulse 100   Temp 97.9 F (36.6 C) (Temporal)   Resp 14   Ht 5\' 4"  (1.626 m)   Wt 289 lb (131.1 kg)   SpO2 98%   BMI 49.61 kg/m  GEN- NAD, alert and oriented x3 HEENT- PERRL, EOMI, non injected sclera, pink conjunctiva, Neck- Supple,  Neg spurlings, C spine NT  CVS- RRR, no murmur RESP-CTAB MSK- Decreased ROM RUE/ +empty can right side, TTP at [facility], decreasedsrength RUE compared to left   sensation grossly in tact UE/LE  Spine TTP lumbar spine , decreased ROM, hips/knees, spine  EXT- trace ankle  edema Pulses- Radial  2+        Assessment & Plan:      Problem List Items Addressed This Visit    None    Visit Diagnoses    Acute pain of right shoulder    -  Primary   S/P fall, decreased ROM,. shoulder  XRAY normal, continue NSAID, ICE, referral to ortho    Relevant Orders   DG Shoulder Right (Completed)   Ambulatory referral to Orthopedic Surgery   Fall, initial encounter       Relevant Orders   DG Shoulder Right (Completed)   DG Lumbar Spine Complete (Completed)   DG HIPS BILAT WITH PELVIS 3-4 VIEWS (Completed)   Ambulatory referral to Orthopedic Surgery   Acute bilateral low back pain without sciatica       DDD noted on xray, no fracture from fall, hip xray is normal , no red flags one exam, norco given   Relevant Medications   HYDROcodone-acetaminophen (NORCO) 5-325 MG tablet   Other Relevant Orders   DG Lumbar Spine Complete (Completed)   Bilateral hip pain       Relevant Orders   DG HIPS BILAT WITH PELVIS 3-4 VIEWS (Completed)   Rotator cuff syndrome, right       Relevant Orders   Ambulatory referral to Orthopedic Surgery      Note: This dictation was prepared with Dragon dictation along with smaller phrase technology. Any transcriptional errors that result from this process are unintentional.

## 2019-10-03 ENCOUNTER — Encounter: Payer: Self-pay | Admitting: Family Medicine

## 2019-10-05 ENCOUNTER — Ambulatory Visit (INDEPENDENT_AMBULATORY_CARE_PROVIDER_SITE_OTHER): Payer: Medicare Other | Admitting: Orthopaedic Surgery

## 2019-10-05 ENCOUNTER — Encounter: Payer: Self-pay | Admitting: Orthopaedic Surgery

## 2019-10-05 ENCOUNTER — Other Ambulatory Visit: Payer: Self-pay

## 2019-10-05 DIAGNOSIS — G5603 Carpal tunnel syndrome, bilateral upper limbs: Secondary | ICD-10-CM | POA: Diagnosis not present

## 2019-10-05 DIAGNOSIS — M25511 Pain in right shoulder: Secondary | ICD-10-CM

## 2019-10-05 MED ORDER — LIDOCAINE HCL 2 % IJ SOLN
2.0000 mL | INTRAMUSCULAR | Status: AC | PRN
  Administered 2019-10-05: 2 mL

## 2019-10-05 MED ORDER — BUPIVACAINE HCL 0.25 % IJ SOLN
2.0000 mL | INTRAMUSCULAR | Status: AC | PRN
  Administered 2019-10-05: 14:00:00 2 mL via INTRA_ARTICULAR

## 2019-10-05 MED ORDER — METHYLPREDNISOLONE ACETATE 40 MG/ML IJ SUSP
40.0000 mg | INTRAMUSCULAR | Status: AC | PRN
  Administered 2019-10-05: 40 mg via INTRA_ARTICULAR

## 2019-10-05 NOTE — Progress Notes (Signed)
Office Visit Note   Patient: COLUMBIA BARBERENA           Date of Birth: 02/01/1956           MRN: [id number] Visit Date: 10/05/2019              Requested by: Alycia Rossetti, MD [id number] [place],  [place] [address] PCP: Susy Frizzle, MD   Assessment & Plan: Visit Diagnoses:  1. Acute pain of right shoulder   2. Bilateral carpal tunnel syndrome     Plan: Impression is right shoulder rotator cuff bursitis versus less likely rotator cuff tear and bilateral hand carpal tunnel syndrome.  In regards to her shoulder, we will inject the subacromial space with cortisone today.  If she is not any better in the next 2 to 3 weeks, she will call us next no and will obtain an MRI to assess her rotator cuff.  In regards to her hands, we will refer her to Dr. Ernestina Patches for nerve conduction study bilateral upper extremity.  Follow-up with [place] once has been completed.  Follow-Up Instructions: Return for after NCS.   Orders:  Orders Placed This Encounter  Procedures  . Large Joint Inj: R subacromial bursa   No orders of the defined types were placed in this encounter.     Procedures: Large Joint Inj: R subacromial bursa on 10/05/2019 1:40 PM Indications: pain Details: 22 G needle Medications: 2 mL lidocaine 2 %; 2 mL bupivacaine 0.25 %; 40 mg methylPREDNISolone acetate 40 MG/ML Outcome: tolerated well, no immediate complications Patient was prepped and draped in the usual sterile fashion.       Clinical Data: No additional findings.   Subjective: Chief Complaint  Patient presents with  . Right Shoulder - Pain    HPI is a pleasant 64 year old female who comes in today with an injury to her right shoulder.  Approximately 5 days ago, she was coming down the small set of stairs in her carport when she slipped landing on her right side and right outstretched hand.  She has had significant pain since which is actually improved over the past few days.  She was seen previously  where x-rays were obtained.  These were negative for fracture.  She comes in today for further evaluation treatment recommendation.  Pain she has is primarily into the deltoid but occasionally into the anterior aspect of the shoulder.  Any range of motion past 90 degrees of forward flexion or abduction seems to be most aggravating.  She also has increased pain with internal and external rotation.  She has been taking NSAIDs as well as an occasional Norco without relief of symptoms.  No previous cortisone injection or surgical intervention to the right shoulder.  Other issue she brings up today is bilateral hand pain, numbness and tingling right greater than left.  She is right-handed.  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  She has the sensations to the thumb, index and long fingers.  The symptoms are aggravated while sleeping as well as while driving her car.  She has tried carpal tunnel braces to both wrists with mild relief of symptoms.  She has not had a previous nerve conduction study or carpal tunnel injections.  Review of Systems as detailed in HPI.  All others reviewed and are negative.   Objective: Vital Signs: There were no vitals taken for this visit.  Physical Exam well-developed well-nourished female no  acute distress.  Alert and oriented x3.  Ortho Exam examination of the right shoulder reveals approximately 75% active range of motion all planes.  She can internally rotate to her back pocket.  Minimally positive empty can.  She has 4-1/2 out of 5 strength throughout.  Negative cross body adduction.  Examination of both hands reveals positive Phalen on the right and minimally positive on the left.  Negative Tinel both sides.  No thenar atrophy.  She is neurovascular intact distally.  Specialty Comments:  No specialty comments available.  Imaging: No new imaging   PMFS History: Patient Active Problem List   Diagnosis Date Noted  . Hypertension 04/12/2019  .  Glucose intolerance 04/12/2019  . Chronic venous insufficiency 04/06/2019  . Osteopenia 04/06/2019   Past Medical History:  Diagnosis Date  . Arthritis   . Asthma   . Chronic venous insufficiency   . GERD (gastroesophageal reflux disease)   . Hypertension     History reviewed. No pertinent family history.  Past Surgical History:  Procedure Laterality Date  . ABDOMINAL HYSTERECTOMY    . COLONOSCOPY  06/05/2012   Procedure: COLONOSCOPY;  Surgeon: Rogene Houston, MD;  Location: [facility];  Service: Endoscopy;  Laterality: N/A;  125-changed to 1225 Ann to notify pt  . NISSEN FUNDOPLICATION     x2   Social History   Occupational History  . Not on file  Tobacco Use  . Smoking status: Never Smoker  . Smokeless tobacco: Never Used  Substance and Sexual Activity  . Alcohol use: No  . Drug use: No  . Sexual activity: Not on file

## 2019-10-06 ENCOUNTER — Other Ambulatory Visit: Payer: Self-pay | Admitting: Radiology

## 2019-10-06 DIAGNOSIS — G5603 Carpal tunnel syndrome, bilateral upper limbs: Secondary | ICD-10-CM

## 2019-10-07 ENCOUNTER — Encounter: Payer: Self-pay | Admitting: Physical Medicine and Rehabilitation

## 2019-10-27 ENCOUNTER — Ambulatory Visit (INDEPENDENT_AMBULATORY_CARE_PROVIDER_SITE_OTHER): Payer: Medicare Other | Admitting: Physical Medicine and Rehabilitation

## 2019-10-27 ENCOUNTER — Encounter: Payer: Self-pay | Admitting: Physical Medicine and Rehabilitation

## 2019-10-27 ENCOUNTER — Other Ambulatory Visit: Payer: Self-pay

## 2019-10-27 DIAGNOSIS — R202 Paresthesia of skin: Secondary | ICD-10-CM

## 2019-10-27 NOTE — Progress Notes (Signed)
Jessica [name] - 64 y.o. female MRN [id number]  Date of birth: Apr 24, 1956  Office Visit Note: Visit Date: 10/27/2019 PCP: Susy Frizzle, MD Referred by: Susy Frizzle, MD  Subjective: Chief Complaint  Patient presents with  . Right Hand - Numbness, Tingling, Pain  . Left Hand - Numbness, Tingling, Pain   HPI:  Jessica [name] is a 64 y.o. female who comes in today At the request of Dr. Eduard Roux for electrodiagnostic study of both upper limbs. Pt states pain, numbness and tingling in the right (thumb, index, and middle finger) and left hand (thumb, index, and middle finger). She reports writing and driving makes symptoms worse. Ssymptoms started a couple of years ago and has gotten worse after a fall when she landed on her right hand a month ago. Napoxen, heat, and cold compress helps with symptoms. She is right hand dominant.   ROS Otherwise per HPI.  Assessment & Plan: Visit Diagnoses:  1. Paresthesia of skin     Plan: Impression: The above electrodiagnostic study is ABNORMAL and reveals evidence of a moderate right median nerve entrapment at the wrist (carpal tunnel syndrome) affecting sensory and motor components. Incidental finding of likely Martin-Gruber anastomoses.   There is no significant electrodiagnostic evidence of any other focal nerve entrapment, brachial plexopathy or cervical radiculopathy.   Recommendations: 1.  Follow-up with referring physician. 2.  Continue current management of symptoms. 3.  Continue use of resting splint at night-time and as needed during the day. 4.  Suggest surgical evaluation.  Meds & Orders: No orders of the defined types were placed in this encounter.   Orders Placed This Encounter  Procedures  . NCV with EMG (electromyography)    Follow-up: Return for Eduard Roux, MD.   Procedures: No procedures performed  EMG & NCV Findings: Evaluation of the right median motor nerve showed prolonged distal onset latency (6.3 ms) and  reduced amplitude (4.3 mV).  The right median (across palm) sensory nerve showed no response (Palm) and prolonged distal peak latency (5.0 ms).  The right ulnar sensory nerve showed reduced amplitude (5.8 V).  All remaining nerves (as indicated in the following tables) were within normal limits.  Left vs. Right side comparison data for the median motor nerve indicates abnormal L-R latency difference (2.5 ms) and abnormal L-R velocity difference (Elbow-Wrist, 13 m/s).  The ulnar sensory nerve indicates abnormal L-R amplitude difference (77.8 %).  All remaining left vs. right side differences were within normal limits.    All examined muscles (as indicated in the following table) showed no evidence of electrical instability.    Impression: The above electrodiagnostic study is ABNORMAL and reveals evidence of a moderate right median nerve entrapment at the wrist (carpal tunnel syndrome) affecting sensory and motor components. Incidental finding of likely Martin-Gruber anastomoses.   There is no significant electrodiagnostic evidence of any other focal nerve entrapment, brachial plexopathy or cervical radiculopathy.   Recommendations: 1.  Follow-up with referring physician. 2.  Continue current management of symptoms. 3.  Continue use of resting splint at night-time and as needed during the day. 4.  Suggest surgical evaluation.  ___________________________ Laurence Spates FAAPMR Board Certified, American Board of Physical Medicine and Rehabilitation    Nerve Conduction Studies Anti Sensory Summary Table   Stim Site NR Peak (ms) Norm Peak (ms) P-T Amp (V) Norm P-T Amp Site1 Site2 Delta-P (ms) Dist (cm) Vel (m/s) Norm Vel (m/s)  Left Median Acr Palm Anti Sensory (2nd Digit)  31.9C  Wrist    3.6 <3.6 32.1 >10 Wrist Palm 1.8 0.0    Palm    1.8 <2.0 9.0         Right Median Acr Palm Anti Sensory (2nd Digit)  30.4C  Wrist    *5.0 <3.6 16.1 >10 Wrist Palm  0.0    Palm *NR  <2.0          Left  Radial Anti Sensory (Base 1st Digit)  31.6C  Wrist    2.2 <3.1 17.2  Wrist Base 1st Digit 2.2 0.0    Right Radial Anti Sensory (Base 1st Digit)  30.3C  Wrist    2.2 <3.1 21.5  Wrist Base 1st Digit 2.2 0.0    Left Ulnar Anti Sensory (5th Digit)  32C  Wrist    3.5 <3.7 26.1 >15.0 Wrist 5th Digit 3.5 14.0 40 >38  Right Ulnar Anti Sensory (5th Digit)  29.8C  Wrist    3.4 <3.7 *5.8 >15.0 Wrist 5th Digit 3.4 14.0 41 >38   Motor Summary Table   Stim Site NR Onset (ms) Norm Onset (ms) O-P Amp (mV) Norm O-P Amp Site1 Site2 Delta-0 (ms) Dist (cm) Vel (m/s) Norm Vel (m/s)  Left Median Motor (Abd Poll Brev)  31.7C  Wrist    3.8 <4.2 6.2 >5 Elbow Wrist 4.0 20.0 50 >50  Elbow    7.8  4.7         Right Median Motor (Abd Poll Brev)  30.4C    [email or web address]_[email or web address]  Wrist    *6.3 <4.2 *4.3 >5 Elbow Wrist 3.1 19.5 63 >50  Elbow    9.4  4.5         Left Ulnar Motor (Abd Dig Min)  32C  Wrist    3.4 <4.2 9.0 >3 B Elbow Wrist 3.0 19.0 63 >53  B Elbow    6.4  8.5  A Elbow B Elbow 1.3 10.0 77 >53  A Elbow    7.7  8.0         Right Ulnar Motor (Abd Dig Min)  29.9C  Wrist    3.3 <4.2 10.1 >3 B Elbow Wrist 3.0 19.5 65 >53  B Elbow    6.3  6.3  A Elbow B Elbow 1.5 10.0 67 >53  A Elbow    7.8  6.6          EMG   Side Muscle Nerve Root Ins Act Fibs Psw Amp Dur Poly Recrt Int Fraser Din Comment  Right Abd Poll Brev Median C8-T1 Nml Nml Nml Nml Nml 0 Nml Nml   Right 1stDorInt Ulnar C8-T1 Nml Nml Nml Nml Nml 0 Nml Nml   Right PronatorTeres Median C6-7 Nml Nml Nml Nml Nml 0 Nml Nml   Right Biceps Musculocut C5-6 Nml Nml Nml Nml Nml 0 Nml Nml   Right Deltoid Axillary C5-6 Nml Nml Nml Nml Nml 0 Nml Nml     Nerve Conduction Studies Anti Sensory Left/Right Comparison   Stim Site L Lat (ms) R Lat (ms) L-R Lat (ms) L Amp (V) R Amp (V) L-R Amp (%) Site1 Site2 L Vel (m/s) R Vel (m/s) L-R Vel (m/s)  Median Acr Palm Anti Sensory (2nd Digit)  31.9C  Wrist 3.6 *5.0 1.4 32.1 16.1 49.8 Wrist Palm     Palm 1.8   9.0           Radial Anti Sensory (Base 1st Digit)  31.6C  Wrist 2.2 2.2 0.0 17.2 21.5 20.0 Wrist Base 1st  Digit     Ulnar Anti Sensory (5th Digit)  32C  Wrist 3.5 3.4 0.1 26.1 *5.8 *77.8 Wrist 5th Digit 40 41 1   Motor Left/Right Comparison   Stim Site L Lat (ms) R Lat (ms) L-R Lat (ms) L Amp (mV) R Amp (mV) L-R Amp (%) Site1 Site2 L Vel (m/s) R Vel (m/s) L-R Vel (m/s)  Median Motor (Abd Poll Brev)  31.7C  Wrist 3.8 *6.3 *2.5 6.2 *4.3 30.6 Elbow Wrist 50 63 *13  Elbow 7.8 9.4 1.6 4.7 4.5 4.3       Ulnar Motor (Abd Dig Min)  32C  Wrist 3.4 3.3 0.1 9.0 10.1 10.9 B Elbow Wrist 63 65 2  B Elbow 6.4 6.3 0.1 8.5 6.3 25.9 A Elbow B Elbow 77 67 10  A Elbow 7.7 7.8 0.1 8.0 6.6 17.5          Waveforms:                      Clinical History: No specialty comments available.     Objective:  VS:  HT:    WT:   BMI:     BP:   HR: bpm  TEMP: ( )  RESP:  Physical Exam Musculoskeletal:        General: No swelling, tenderness or deformity.     Comments: Inspection reveals no atrophy of the bilateral APB or FDI or hand intrinsics. There is no swelling, color changes, allodynia or dystrophic changes. There is 5 out of 5 strength in the bilateral wrist extension, finger abduction and long finger flexion. There is intact sensation to light touch in all dermatomal and peripheral nerve distributions. There is a positive Phalen's test on the right. There is a negative Hoffmann's test bilaterally.  Skin:    General: Skin is warm and dry.     Findings: No erythema or rash.  Neurological:     General: No focal deficit present.     Mental Status: She is alert and oriented to person, place, and time.     Motor: No weakness or abnormal muscle tone.     Coordination: Coordination normal.  Psychiatric:        Mood and Affect: Mood normal.        Behavior: Behavior normal.     Ortho Exam Imaging: No results found.

## 2019-10-27 NOTE — Progress Notes (Signed)
Numeric Pain Rating Scale and Functional Assessment Average Pain 7   In the last MONTH (on 0-10 scale) has pain interfered with the following?  1. General activity like being  able to carry out your everyday physical activities such as walking, climbing stairs, carrying groceries, or moving a chair?  Rating(7)   

## 2019-11-01 NOTE — Procedures (Signed)
EMG & NCV Findings: Evaluation of the right median motor nerve showed prolonged distal onset latency (6.3 ms) and reduced amplitude (4.3 mV).  The right median (across palm) sensory nerve showed no response (Palm) and prolonged distal peak latency (5.0 ms).  The right ulnar sensory nerve showed reduced amplitude (5.8 V).  All remaining nerves (as indicated in the following tables) were within normal limits.  Left vs. Right side comparison data for the median motor nerve indicates abnormal L-R latency difference (2.5 ms) and abnormal L-R velocity difference (Elbow-Wrist, 13 m/s).  The ulnar sensory nerve indicates abnormal L-R amplitude difference (77.8 %).  All remaining left vs. right side differences were within normal limits.    All examined muscles (as indicated in the following table) showed no evidence of electrical instability.    Impression: The above electrodiagnostic study is ABNORMAL and reveals evidence of a moderate right median nerve entrapment at the wrist (carpal tunnel syndrome) affecting sensory and motor components. Incidental finding of likely Martin-Gruber anastomoses.   There is no significant electrodiagnostic evidence of any other focal nerve entrapment, brachial plexopathy or cervical radiculopathy.   Recommendations: 1.  Follow-up with referring physician. 2.  Continue current management of symptoms. 3.  Continue use of resting splint at night-time and as needed during the day. 4.  Suggest surgical evaluation.  ___________________________ Laurence Spates FAAPMR Board Certified, American Board of Physical Medicine and Rehabilitation    Nerve Conduction Studies Anti Sensory Summary Table   Stim Site NR Peak (ms) Norm Peak (ms) P-T Amp (V) Norm P-T Amp Site1 Site2 Delta-P (ms) Dist (cm) Vel (m/s) Norm Vel (m/s)  Left Median Acr Palm Anti Sensory (2nd Digit)  31.9C  Wrist    3.6 <3.6 32.1 >10 Wrist Palm 1.8 0.0    Palm    1.8 <2.0 9.0         Right Median Acr Palm  Anti Sensory (2nd Digit)  30.4C  Wrist    *5.0 <3.6 16.1 >10 Wrist Palm  0.0    Palm *NR  <2.0          Left Radial Anti Sensory (Base 1st Digit)  31.6C  Wrist    2.2 <3.1 17.2  Wrist Base 1st Digit 2.2 0.0    Right Radial Anti Sensory (Base 1st Digit)  30.3C  Wrist    2.2 <3.1 21.5  Wrist Base 1st Digit 2.2 0.0    Left Ulnar Anti Sensory (5th Digit)  32C  Wrist    3.5 <3.7 26.1 >15.0 Wrist 5th Digit 3.5 14.0 40 >38  Right Ulnar Anti Sensory (5th Digit)  29.8C  Wrist    3.4 <3.7 *5.8 >15.0 Wrist 5th Digit 3.4 14.0 41 >38   Motor Summary Table   Stim Site NR Onset (ms) Norm Onset (ms) O-P Amp (mV) Norm O-P Amp Site1 Site2 Delta-0 (ms) Dist (cm) Vel (m/s) Norm Vel (m/s)  Left Median Motor (Abd Poll Brev)  31.7C  Wrist    3.8 <4.2 6.2 >5 Elbow Wrist 4.0 20.0 50 >50  Elbow    7.8  4.7         Right Median Motor (Abd Poll Brev)  30.4C    martin_[email or web address]  Wrist    *6.3 <4.2 *4.3 >5 Elbow Wrist 3.1 19.5 63 >50  Elbow    9.4  4.5         Left Ulnar Motor (Abd Dig Min)  32C  Wrist    3.4 <4.2 9.0 >3 B Elbow Wrist 3.0  19.0 63 >53  B Elbow    6.4  8.5  A Elbow B Elbow 1.3 10.0 77 >53  A Elbow    7.7  8.0         Right Ulnar Motor (Abd Dig Min)  29.9C  Wrist    3.3 <4.2 10.1 >3 B Elbow Wrist 3.0 19.5 65 >53  B Elbow    6.3  6.3  A Elbow B Elbow 1.5 10.0 67 >53  A Elbow    7.8  6.6          EMG   Side Muscle Nerve Root Ins Act Fibs Psw Amp Dur Poly Recrt Int Fraser Din Comment  Right Abd Poll Brev Median C8-T1 Nml Nml Nml Nml Nml 0 Nml Nml   Right 1stDorInt Ulnar C8-T1 Nml Nml Nml Nml Nml 0 Nml Nml   Right PronatorTeres Median C6-7 Nml Nml Nml Nml Nml 0 Nml Nml   Right Biceps Musculocut C5-6 Nml Nml Nml Nml Nml 0 Nml Nml   Right Deltoid Axillary C5-6 Nml Nml Nml Nml Nml 0 Nml Nml     Nerve Conduction Studies Anti Sensory Left/Right Comparison   Stim Site L Lat (ms) R Lat (ms) L-R Lat (ms) L Amp (V) R Amp (V) L-R Amp (%) Site1 Site2 L Vel (m/s) R Vel (m/s) L-R Vel (m/s)  Median Acr  Palm Anti Sensory (2nd Digit)  31.9C  Wrist 3.6 *5.0 1.4 32.1 16.1 49.8 Wrist Palm     Palm 1.8   9.0         Radial Anti Sensory (Base 1st Digit)  31.6C  Wrist 2.2 2.2 0.0 17.2 21.5 20.0 Wrist Base 1st Digit     Ulnar Anti Sensory (5th Digit)  32C  Wrist 3.5 3.4 0.1 26.1 *5.8 *77.8 Wrist 5th Digit 40 41 1   Motor Left/Right Comparison   Stim Site L Lat (ms) R Lat (ms) L-R Lat (ms) L Amp (mV) R Amp (mV) L-R Amp (%) Site1 Site2 L Vel (m/s) R Vel (m/s) L-R Vel (m/s)  Median Motor (Abd Poll Brev)  31.7C  Wrist 3.8 *6.3 *2.5 6.2 *4.3 30.6 Elbow Wrist 50 63 *13  Elbow 7.8 9.4 1.6 4.7 4.5 4.3       Ulnar Motor (Abd Dig Min)  32C  Wrist 3.4 3.3 0.1 9.0 10.1 10.9 B Elbow Wrist 63 65 2  B Elbow 6.4 6.3 0.1 8.5 6.3 25.9 A Elbow B Elbow 77 67 10  A Elbow 7.7 7.8 0.1 8.0 6.6 17.5          Waveforms:

## 2019-11-04 ENCOUNTER — Encounter: Payer: Self-pay | Admitting: Orthopaedic Surgery

## 2019-11-04 ENCOUNTER — Other Ambulatory Visit: Payer: Self-pay

## 2019-11-04 ENCOUNTER — Ambulatory Visit (INDEPENDENT_AMBULATORY_CARE_PROVIDER_SITE_OTHER): Payer: Medicare Other | Admitting: Orthopaedic Surgery

## 2019-11-04 DIAGNOSIS — M25511 Pain in right shoulder: Secondary | ICD-10-CM | POA: Diagnosis not present

## 2019-11-04 DIAGNOSIS — G5601 Carpal tunnel syndrome, right upper limb: Secondary | ICD-10-CM

## 2019-11-04 NOTE — Addendum Note (Signed)
Addended by: Precious Bard on: 11/04/2019 02:45 PM   Modules accepted: Orders

## 2019-11-04 NOTE — Progress Notes (Signed)
   Office Visit Note   Patient: Jessica [name]           Date of Birth: 09/09/1955           MRN: [id number] Visit Date: 11/04/2019              Requested by: Susy Frizzle, MD [address] [place],   [id number] PCP: Susy Frizzle, MD   Assessment & Plan: Visit Diagnoses:  1. Right carpal tunnel syndrome   2. Acute pain of right shoulder     Plan: Impression is moderate right carpal tunnel syndrome and suspected right rotator cuff tear.  At this point we will need to obtain MRI of the right shoulder to evaluate for rotator cuff tear.  Follow-up in about 10 to 14 days.  Patient did discuss her desire to have the carpal tunnel surgery as well as right shoulder surgery if surgery is needed for the shoulder.  Follow-Up Instructions: Return for 10-14 days to review MRI.   Orders:  No orders of the defined types were placed in this encounter.  No orders of the defined types were placed in this encounter.     Procedures: No procedures performed   Clinical Data: No additional findings.   Subjective: Chief Complaint  Patient presents with  . Left Hand - Pain, Follow-up  . Right Hand - Pain, Follow-up    Della returns today for nerve conduction studies and follow-up for her right shoulder weakness and pain.  Nerve conduction study shows a moderate right carpal tunnel syndrome.  Her right shoulder continues to have significant weakness with elevation and pain.  Subacromial injection only gave temporary relief.   Review of Systems   Objective: Vital Signs: There were no vitals taken for this visit.  Physical Exam  Ortho Exam Right shoulder shows weakness and reaching above head with shrugging.  Positive drop arm and positive empty can. Right hand exam is unchanged. Specialty Comments:  No specialty comments available.  Imaging: No results found.   PMFS History: Patient Active Problem List   Diagnosis Date Noted  . Right carpal tunnel  syndrome 11/04/2019  . Acute pain of right shoulder 11/04/2019  . Hypertension 04/12/2019  . Glucose intolerance 04/12/2019  . Chronic venous insufficiency 04/06/2019  . Osteopenia 04/06/2019   Past Medical History:  Diagnosis Date  . Arthritis   . Asthma   . Chronic venous insufficiency   . GERD (gastroesophageal reflux disease)   . Hypertension     History reviewed. No pertinent family history.  Past Surgical History:  Procedure Laterality Date  . ABDOMINAL HYSTERECTOMY    . COLONOSCOPY  06/05/2012   Procedure: COLONOSCOPY;  Surgeon: Rogene Houston, MD;  [facility];  Service: Endoscopy;  Laterality: N/A;  125-changed to 1225 Ann to notify pt  . NISSEN FUNDOPLICATION     x2   Social History   Occupational History  . Not on file  Tobacco Use  . Smoking status: Never Smoker  . Smokeless tobacco: Never Used  Substance and Sexual Activity  . Alcohol use: No  . Drug use: No  . Sexual activity: Not on file

## 2019-11-08 ENCOUNTER — Encounter: Payer: Self-pay | Admitting: Orthopaedic Surgery

## 2019-11-16 ENCOUNTER — Ambulatory Visit (HOSPITAL_COMMUNITY)
Admission: RE | Admit: 2019-11-16 | Discharge: 2019-11-16 | Disposition: A | Discharge status: Home / Self Care | Payer: Medicare Other | Source: Ambulatory Visit | Attending: Orthopaedic Surgery | Admitting: Orthopaedic Surgery

## 2019-11-16 ENCOUNTER — Other Ambulatory Visit: Payer: Self-pay | Admitting: Family Medicine

## 2019-11-16 ENCOUNTER — Other Ambulatory Visit: Payer: Self-pay

## 2019-11-16 DIAGNOSIS — M25511 Pain in right shoulder: Secondary | ICD-10-CM | POA: Diagnosis present

## 2019-11-18 ENCOUNTER — Other Ambulatory Visit: Payer: Self-pay

## 2019-11-18 ENCOUNTER — Ambulatory Visit (INDEPENDENT_AMBULATORY_CARE_PROVIDER_SITE_OTHER): Payer: Medicare Other | Admitting: Orthopaedic Surgery

## 2019-11-18 ENCOUNTER — Encounter: Payer: Self-pay | Admitting: Orthopaedic Surgery

## 2019-11-18 DIAGNOSIS — S46811A Strain of other muscles, fascia and tendons at shoulder and upper arm level, right arm, initial encounter: Secondary | ICD-10-CM | POA: Diagnosis not present

## 2019-11-18 DIAGNOSIS — G5601 Carpal tunnel syndrome, right upper limb: Secondary | ICD-10-CM | POA: Diagnosis not present

## 2019-11-18 NOTE — Progress Notes (Signed)
Office Visit Note   Patient: Jessica [name]           Date of Birth: [birth year]           MRN: [id number] Visit Date: 11/18/2019              Requested by: Susy Frizzle, MD [address] [place],  [place] [address] PCP: Susy Frizzle, MD   Assessment & Plan: Visit Diagnoses:  1. Right carpal tunnel syndrome   2. Traumatic tear of supraspinatus tendon of right shoulder, initial encounter   3. Tear of right infraspinatus tendon, initial encounter     Plan: Impression is moderate right carpal tunnel syndrome and MRI findings consistent with a large retracted supraspinatus and infraspinatus tears with some extension into the musculotendinous junction of the infraspinatus.  There is moderate atrophy of the infraspinatus and mild atrophy of the supraspinatus.  There is also chondral loose bodies in the subcoracoid recess.  The MRI findings were reviewed with the patient in detail and we had a lengthy discussion on treatment options including continued conservative treatment and physical therapy versus surgical intervention with attempted rotator cuff repair.  She understands that the rotator cuff may not be repairable and that she may be facing a reverse shoulder arthroplasty in the future.  She would like to make a lot of efforts to do the repair now.  She would also like to have the right carpal tunnel release at the time of surgery.  Questions encouraged and answered.  Follow-Up Instructions: Return if symptoms worsen or fail to improve.   Orders:  No orders of the defined types were placed in this encounter.  No orders of the defined types were placed in this encounter.     Procedures: No procedures performed   Clinical Data: No additional findings.   Subjective: Chief Complaint  Patient presents with  . Right Shoulder - Pain    Shir returns today for MRI review of the right shoulder as well as follow-up for her right moderate carpal tunnel syndrome.   She continues to have significant difficulty with overhead activity such as putting up dishes or washing her hair.  The pain is worse at night and it is constant.  This is severely worse since her fall on 09/30/2019.   Review of Systems  Constitutional: Negative.   HENT: Negative.   Eyes: Negative.   Respiratory: Negative.   Cardiovascular: Negative.   Endocrine: Negative.   Musculoskeletal: Negative.   Neurological: Negative.   Hematological: Negative.   Psychiatric/Behavioral: Negative.   All other systems reviewed and are negative.    Objective: Vital Signs: There were no vitals taken for this visit.  Physical Exam Vitals and nursing note reviewed.  Constitutional:      Appearance: She is well-developed.  Pulmonary:     Effort: Pulmonary effort is normal.  Skin:    General: Skin is warm.     Capillary Refill: Capillary refill takes less than 2 seconds.  Neurological:     Mental Status: She is alert and oriented to person, place, and time.  Psychiatric:        Behavior: Behavior normal.        Thought Content: Thought content normal.        Judgment: Judgment normal.     Ortho Exam Right hand exam is unchanged. Right shoulder shows weakness and pain with shoulder abduction elevation with shrugging compensation. Specialty Comments:  No specialty comments available.  Imaging: No results found.   PMFS History: Patient Active Problem List   Diagnosis Date Noted  . Traumatic tear of supraspinatus tendon of right shoulder 11/18/2019  . Tear of right infraspinatus tendon 11/18/2019  . Right carpal tunnel syndrome 11/04/2019  . Acute pain of right shoulder 11/04/2019  . Hypertension 04/12/2019  . Glucose intolerance 04/12/2019  . Chronic venous insufficiency 04/06/2019  . Osteopenia 04/06/2019   Past Medical History:  Diagnosis Date  . Arthritis   . Asthma   . Chronic venous insufficiency   . GERD (gastroesophageal reflux disease)   . Hypertension      History reviewed. No pertinent family history.  Past Surgical History:  Procedure Laterality Date  . ABDOMINAL HYSTERECTOMY    . COLONOSCOPY  06/05/2012   Procedure: COLONOSCOPY;  Surgeon: Rogene Houston, MD;  Location: [facility];  Service: Endoscopy;  Laterality: N/A;  125-changed to 1225 Ann to notify pt  . NISSEN FUNDOPLICATION     x2   Social History   Occupational History  . Not on file  Tobacco Use  . Smoking status: Never Smoker  . Smokeless tobacco: Never Used  Substance and Sexual Activity  . Alcohol use: No  . Drug use: No  . Sexual activity: Not on file

## 2019-11-21 ENCOUNTER — Encounter: Payer: Self-pay | Admitting: Orthopaedic Surgery

## 2019-11-23 ENCOUNTER — Telehealth: Payer: Self-pay | Admitting: Orthopedic Surgery

## 2019-11-23 NOTE — Telephone Encounter (Signed)
Please have appointment made for Dr. Marlou Sa to discuss shoulder replacement versus rotator cuff repair.  Thanks

## 2019-11-23 NOTE — Telephone Encounter (Signed)
Called patient left message to return call to schedule an appointment with Dr. Marlou Sa to discuss shoulder replacenent

## 2019-12-01 ENCOUNTER — Ambulatory Visit (INDEPENDENT_AMBULATORY_CARE_PROVIDER_SITE_OTHER): Payer: Medicare Other | Admitting: Orthopedic Surgery

## 2019-12-01 ENCOUNTER — Other Ambulatory Visit: Payer: Self-pay

## 2019-12-01 ENCOUNTER — Ambulatory Visit: Admit: 2019-12-01 | Payer: Medicare Other | Admitting: Orthopedic Surgery

## 2019-12-01 ENCOUNTER — Encounter: Payer: Self-pay | Admitting: Orthopedic Surgery

## 2019-12-01 VITALS — Ht 64.0 in | Wt 289.0 lb

## 2019-12-01 DIAGNOSIS — S46811A Strain of other muscles, fascia and tendons at shoulder and upper arm level, right arm, initial encounter: Secondary | ICD-10-CM | POA: Diagnosis not present

## 2019-12-01 SURGERY — SHOULDER ARTHROSCOPY WITH SUBACROMIAL DECOMPRESSION, ROTATOR CUFF REPAIR AND BICEP TENDON REPAIR
Anesthesia: General | Laterality: Right

## 2019-12-02 ENCOUNTER — Other Ambulatory Visit: Payer: Self-pay

## 2019-12-03 ENCOUNTER — Encounter: Payer: Self-pay | Admitting: Orthopedic Surgery

## 2019-12-03 NOTE — Progress Notes (Signed)
Office Visit Note   Patient: Jessica [name]           Date of Birth: 13-May-1956           MRN: [id number] Visit Date: 12/01/2019 Requested by: Jessica Frizzle, MD [address] [place],  [place] [address] PCP: Jessica Frizzle, MD  Subjective: Chief Complaint  Patient presents with  . Right Shoulder - Pain    HPI: Jessica [name] is a 64 year old patient with right shoulder pain.  She fell onto a hard surface 09/30/2019.  She also has known carpal tunnel syndrome on the right-hand side.  She is retired.  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  She enjoys gardening.  She has had an MRI scan.  That shows rotator cuff tear with retraction and mild atrophy along with some mild degenerative changes to moderate degenerative changes of the glenohumeral joint.  She was having no problems with the shoulder in terms of pain or function prior to the fall.  Had cortisone injection at the end of March.  Hemoglobin A1c is approximately 5.  Has a history of pain with activities of daily living since this shoulder injury.  Patient also has symptomatic carpal tunnel syndrome confirmed by nerve conduction study.              ROS: All systems reviewed are negative as they relate to the chief complaint within the history of present illness.  Patient denies  fevers or chills.   Assessment & Plan: Visit Diagnoses:  1. Traumatic tear of supraspinatus tendon of right shoulder, initial encounter     Plan: Impression is right shoulder rotator cuff tear and carpal tunnel syndrome.  Although she has mild to moderate glenohumeral degenerative changes she was really not symptomatic prior to the fall.  I will think she is a great candidate for tendon transfer.  I do think the stairs may be repairable.  She also has symptomatic carpal tunnel syndrome which she would like to have addressed at the same time which I think is reasonable.  Plan at this time is right shoulder arthroscopy with biceps tendon release and  mini open rotator cuff tear repair.  Risk benefits are discussed with the patient we might limited to infection nerve vessel damage potential inability to achieve full repair of that rotator cuff.  The atrophy is reviewed and it is mild in the infraspinatus muscle belly.  I think that is a better option for her rather than reverse shoulder replacement.  Patient understands risk benefits associated with this course of action.  Risk benefits of carpal tunnel release also discussed include not limited to infection nerve vessel damage hand stiffness as well as diminished grip strength postoperatively.  Patient understands that risk as well.  All questions answered.  Plan for surgery sooner rather than later just to avoid further retraction of those rotator cuff tendons.  Again I think it is unlikely that this rotator cuff tear is chronic.  She was having no symptoms prior to the fall.  Follow-Up Instructions: No follow-ups on file.   Orders:  No orders of the defined types were placed in this encounter.  No orders of the defined types were placed in this encounter.     Procedures: No procedures performed   Clinical Data: No additional findings.  Objective: Vital Signs: Ht 5\' 4"  (1.626 m)   Wt 289 lb (131.1 kg)   BMI 49.61 kg/m   Physical Exam:   Constitutional: Patient appears well-developed  HEENT:  Head: Normocephalic Eyes:EOM are normal Neck: Normal range of motion Cardiovascular: Normal rate Pulmonary/chest: Effort normal Neurologic: Patient is alert Skin: Skin is warm Psychiatric: Patient has normal mood and affect    Ortho Exam: Ortho exam demonstrates forward flexion to about 90 degrees and isolated humeral abduction to about 80.  Does have a little weakness to infraspinatus testing on the right compared to left.  No masses lymphadenopathy or skin change in the shoulder region.  AC joint intact.  No weakness in subscap testing.  Passive range of motion is maintained with  forward flexion to about 160 and isolated glenohumeral abduction above 90.  Examination of the hand demonstrates intact abductor pollicis brevis function.  Strength intact.  Negative Tinel's cubital tunnel at the elbow.  Positive Phalen's test.  EPL FPL interosseous strength intact.  Specialty Comments:  No specialty comments available.  Imaging: No results found.   PMFS History: Patient Active Problem List   Diagnosis Date Noted  . Traumatic tear of supraspinatus tendon of right shoulder 11/18/2019  . Tear of right infraspinatus tendon 11/18/2019  . Right carpal tunnel syndrome 11/04/2019  . Acute pain of right shoulder 11/04/2019  . Hypertension 04/12/2019  . Glucose intolerance 04/12/2019  . Chronic venous insufficiency 04/06/2019  . Osteopenia 04/06/2019   Past Medical History:  Diagnosis Date  . Arthritis   . Asthma   . Chronic venous insufficiency   . GERD (gastroesophageal reflux disease)   . Hypertension     No family history on file.  Past Surgical History:  Procedure Laterality Date  . ABDOMINAL HYSTERECTOMY    . COLONOSCOPY  06/05/2012   Procedure: COLONOSCOPY;  Surgeon: Jessica Houston, MD;  [facility];  Service: Endoscopy;  Laterality: N/A;  125-changed to 1225 Ann to notify pt  . NISSEN FUNDOPLICATION     x2   Social History   Occupational History  . Not on file  Tobacco Use  . Smoking status: Never Smoker  . Smokeless tobacco: Never Used  Substance and Sexual Activity  . Alcohol use: No  . Drug use: No  . Sexual activity: Not on file

## 2019-12-08 NOTE — Progress Notes (Signed)
[place], [place] - [id number] [place] [id number] [place] [address] Phone: [phone] Fax: [phone]      Your procedure is scheduled on Thursday 12/16/2019.  Report to [facility] Main Entrance "A" at 10:15 A.M., and check in at the Admitting office.  Call this number if you have problems the morning of surgery:  [phone]  Call [phone] if you have any questions prior to your surgery date Monday-Friday 8am-4pm    Remember:  Do not eat or drink after midnight the night before your surgery    Take these medicines the morning of surgery with A SIP OF WATER: Albuterol (Proventil/Ventolin) inhaler - if needed Cetirizine (Zyrtec) - if needed Fluticasone (Flonase) - if needed Hydrocodone-acetaminophen (Norco) - if needed  As of today, STOP taking any Aspirin (unless otherwise instructed by your surgeon) and Aspirin containing products, Aleve, Naproxen, Ibuprofen, Motrin, Advil, Goody's, BC's, all herbal medications, fish oil, and all vitamins.                      Do not wear jewelry, make up, or nail polish            Do not wear lotions, powders, perfumes, or deodorant.            Do not shave 48 hours prior to surgery.              Do not bring valuables to the hospital.            [facility] is not responsible for any belongings or valuables.  Do NOT Smoke (Tobacco/Vapping) or drink Alcohol 24 hours prior to your procedure  If you use a CPAP at night, you may bring all equipment for your overnight stay.   Contacts, glasses, dentures or bridgework may not be worn into surgery.      For patients admitted to the hospital, discharge time will be determined by your treatment team.   Patients discharged the day of surgery will not be allowed to drive home, and someone needs to stay with them for 24 hours.    Special instructions:   [place]- Preparing For Surgery  Before surgery, you can play an important role. Because skin is  not sterile, your skin needs to be as free of germs as possible. You can reduce the number of germs on your skin by washing with CHG (chlorahexidine gluconate) Soap before surgery.  CHG is an antiseptic cleaner which kills germs and bonds with the skin to continue killing germs even after washing.    Oral Hygiene is also important to reduce your risk of infection.  Remember - BRUSH YOUR TEETH THE MORNING OF SURGERY WITH YOUR REGULAR TOOTHPASTE  Please do not use if you have an allergy to CHG or antibacterial soaps. If your skin becomes reddened/irritated stop using the CHG.  Do not shave (including legs and underarms) for at least 48 hours prior to first CHG shower. It is OK to shave your face.  Please follow these instructions carefully.   1. Shower the NIGHT BEFORE SURGERY and the MORNING OF SURGERY with CHG Soap.   2. If you chose to wash your hair, wash your hair first as usual with your normal shampoo.  3. After you shampoo, rinse your hair and body thoroughly to remove the shampoo.  4. Use CHG as you would any other liquid soap. You can apply CHG directly to the skin and wash gently with  a scrungie or a clean washcloth.   5. Apply the CHG Soap to your body ONLY FROM THE NECK DOWN.  Do not use on open wounds or open sores. Avoid contact with your eyes, ears, mouth and genitals (private parts). Wash Face and genitals (private parts)  with your normal soap.   6. Wash thoroughly, paying special attention to the area where your surgery will be performed.  7. Thoroughly rinse your body with warm water from the neck down.  8. DO NOT shower/wash with your normal soap after using and rinsing off the CHG Soap.  9. Pat yourself dry with a CLEAN TOWEL.  10. Wear CLEAN PAJAMAS to bed the night before surgery, wear comfortable clothes the morning of surgery  11. Place CLEAN SHEETS on your bed the night of your first shower and DO NOT SLEEP WITH PETS.   Day of Surgery: Shower with CHG soap as  directed  Do not apply any deodorants/lotions.  Please wear clean clothes to the hospital/surgery center.   Remember to brush your teeth WITH YOUR REGULAR TOOTHPASTE.   Please read over the following fact sheets that you were given.

## 2019-12-09 ENCOUNTER — Encounter (HOSPITAL_COMMUNITY)
Admission: RE | Admit: 2019-12-09 | Discharge: 2019-12-09 | Disposition: A | Discharge status: Home / Self Care | Payer: Medicare Other | Source: Ambulatory Visit | Attending: Orthopedic Surgery | Admitting: Orthopedic Surgery

## 2019-12-09 ENCOUNTER — Encounter (HOSPITAL_COMMUNITY): Payer: Self-pay

## 2019-12-09 ENCOUNTER — Other Ambulatory Visit: Payer: Self-pay

## 2019-12-09 DIAGNOSIS — Z01818 Encounter for other preprocedural examination: Secondary | ICD-10-CM | POA: Diagnosis present

## 2019-12-09 HISTORY — DX: Bronchitis, not specified as acute or chronic: J40

## 2019-12-09 HISTORY — DX: Nausea with vomiting, unspecified: Z98.890

## 2019-12-09 HISTORY — DX: Nausea with vomiting, unspecified: R11.2

## 2019-12-09 HISTORY — DX: Carpal tunnel syndrome, right upper limb: G56.01

## 2019-12-09 HISTORY — DX: Pneumonia, unspecified organism: J18.9

## 2019-12-09 HISTORY — DX: Personal history of other diseases of the digestive system: Z87.19

## 2019-12-09 HISTORY — DX: Anemia, unspecified: D64.9

## 2019-12-09 LAB — BASIC METABOLIC PANEL
Anion gap: 8 (ref 5–15)
BUN: 13 mg/dL (ref 8–23)
CO2: 28 mmol/L (ref 22–32)
Calcium: 9.5 mg/dL (ref 8.9–10.3)
Chloride: 103 mmol/L (ref 98–111)
Creatinine, Ser: 1 mg/dL (ref 0.44–1.00)
GFR calc Af Amer: >60 mL/min (ref >60)
GFR calc non Af Amer: 59 mL/min — ABNORMAL LOW (ref >60)
Glucose, Bld: 111 mg/dL — ABNORMAL HIGH (ref 70–99)
Potassium: 4.1 mmol/L (ref 3.5–5.1)
Sodium: 139 mmol/L (ref 135–145)

## 2019-12-09 LAB — CBC
HCT: 41.7 % (ref 36.0–46.0)
Hemoglobin: 13.2 g/dL (ref 12.0–15.0)
MCH: 27.4 pg (ref 26.0–34.0)
MCHC: 31.7 g/dL (ref 30.0–36.0)
MCV: 86.5 fL (ref 80.0–100.0)
Platelets: 188 10*3/uL (ref 150–400)
RBC: 4.82 MIL/uL (ref 3.87–5.11)
RDW: 14.4 % (ref 11.5–15.5)
WBC: 5.3 10*3/uL (ref 4.0–10.5)
nRBC: 0 % (ref 0.0–0.2)

## 2019-12-09 NOTE — Progress Notes (Signed)
Your procedure is scheduled on Thursday, June 3rd.  Report to [facility] Main Entrance "A" at 10:15 A.M., and check in at the Admitting office.  Call this number if you have problems the morning of surgery:  [phone]  Call [phone] if you have any questions prior to your surgery date Monday-Friday 8am-4pm   Remember:  Do not eat after midnight the night before your surgery  You may drink clear liquids until 9:15 A.M.  the morning of your surgery.   Clear liquids allowed are: Water, Non-Citrus Juices (without pulp), Carbonated Beverages, Clear Tea, Black Coffee Only, and Gatorade   Enhanced Recovery after Surgery for Orthopedics Enhanced Recovery after Surgery is a protocol used to improve the stress on your body and your recovery after surgery.  Patient Instructions  . The night before surgery:  o No food after midnight. ONLY clear liquids after midnight  .  Marland Kitchen The day of surgery (if you do NOT have diabetes):  o Drink ONE (1) Pre-Surgery Clear Ensure  9:15 A.M the morning of surgery  o This drink was given to you during your hospital  pre-op appointment visit. Pre-Surgery Ensure depending on your surgery time. o Finish the drink at the designated time by the pre-op nurse.  o Nothing else to drink after completing the  Pre-Surgery Clear Ensure.         If you have questions, please contact your surgeon's office   Take these medicines the morning of surgery with A SIP OF WATER: Albuterol (Proventil/Ventolin) inhaler - if needed Cetirizine (Zyrtec) - if needed Fluticasone (Flonase) - if needed Hydrocodone-acetaminophen (Norco) - if needed  As of today, STOP taking any Aspirin (unless otherwise instructed by your surgeon) and Aspirin containing products, Aleve, Naproxen, Ibuprofen, Motrin, Advil, Goody's, BC's, all herbal medications, fish oil, and all vitamins.             Do not wear jewelry, make up, or nail polish            Do not wear lotions, powders, perfumes,  or deodorant.            Do not shave 48 hours prior to surgery.              Do not bring valuables to the hospital.            [facility] is not responsible for any belongings or valuables.  Do NOT Smoke (Tobacco/Vapping) or drink Alcohol 24 hours prior to your procedure  If you use a CPAP at night, you may bring all equipment for your overnight stay.   Contacts, glasses, dentures or bridgework may not be worn into surgery.      For patients admitted to the hospital, discharge time will be determined by your treatment team.   Patients discharged the day of surgery will not be allowed to drive home, and someone needs to stay with them for 24 hours.  Special instructions:   [place]- Preparing For Surgery  Before surgery, you can play an important role. Because skin is not sterile, your skin needs to be as free of germs as possible. You can reduce the number of germs on your skin by washing with CHG (chlorahexidine gluconate) Soap before surgery.  CHG is an antiseptic cleaner which kills germs and bonds with the skin to continue killing germs even after washing.    Oral Hygiene is also important to reduce your risk of infection.  Remember - BRUSH YOUR TEETH THE MORNING  OF SURGERY WITH YOUR REGULAR TOOTHPASTE  Please do not use if you have an allergy to CHG or antibacterial soaps. If your skin becomes reddened/irritated stop using the CHG.  Do not shave (including legs and underarms) for at least 48 hours prior to first CHG shower. It is OK to shave your face.  Please follow these instructions carefully.   1. Shower the NIGHT BEFORE SURGERY and the MORNING OF SURGERY with CHG Soap.   2. If you chose to wash your hair, wash your hair first as usual with your normal shampoo.  3. After you shampoo, rinse your hair and body thoroughly to remove the shampoo.  4. Use CHG as you would any other liquid soap. You can apply CHG directly to the skin and wash gently with a scrungie or a clean  washcloth.   5. Apply the CHG Soap to your body ONLY FROM THE NECK DOWN.  Do not use on open wounds or open sores. Avoid contact with your eyes, ears, mouth and genitals (private parts). Wash Face and genitals (private parts)  with your normal soap.   6. Wash thoroughly, paying special attention to the area where your surgery will be performed.  7. Thoroughly rinse your body with warm water from the neck down.  8. DO NOT shower/wash with your normal soap after using and rinsing off the CHG Soap.  9. Pat yourself dry with a CLEAN TOWEL.  10. Wear CLEAN PAJAMAS to bed the night before surgery, wear comfortable clothes the morning of surgery  11. Place CLEAN SHEETS on your bed the night of your first shower and DO NOT SLEEP WITH PETS.  Day of Surgery: Shower with CHG soap as directed  Do not apply any deodorants/lotions.  Please wear clean clothes to the hospital/surgery center.   Remember to brush your teeth WITH YOUR REGULAR TOOTHPASTE.   Please read over the following fact sheets that you were given.

## 2019-12-09 NOTE — Progress Notes (Signed)
PCP - Dr. Cletus Gash T. Pickard Cardiologist - denies  PPM/ICD - denies   Chest x-ray - N/A EKG - 12/09/2019  Stress Test - denies ECHO - 05/12/2018 Cardiac Cath - denies  Sleep Study - per patient, had way more than 5 years ago, started wearing CPAP, but stopped, was supposed to be re-fitted but insurance won't cover it.  DM: denies Blood Thinner Instructions: N/A Aspirin Instructions: N/A  ERAS Protcol - Yes PRE-SURGERY Ensure or G2- Ensure given  COVID TEST- Scheduled for 12/14/2019. Patient verbalized understanding of self-quarantine instructions, appointment time and place.  Anesthesia review: YES, echo  Patient denies shortness of breath, fever, cough and chest pain at PAT appointment  All instructions explained to the patient, with a verbal understanding of the material. Patient agrees to go over the instructions while at home for a better understanding. Patient also instructed to self quarantine after being tested for COVID-19. The opportunity to ask questions was provided.

## 2019-12-14 ENCOUNTER — Other Ambulatory Visit (HOSPITAL_COMMUNITY)
Admission: RE | Admit: 2019-12-14 | Discharge: 2019-12-14 | Disposition: A | Discharge status: Home / Self Care | Payer: Medicare Other | Source: Ambulatory Visit | Attending: Orthopedic Surgery | Admitting: Orthopedic Surgery

## 2019-12-14 DIAGNOSIS — Z20822 Contact with and (suspected) exposure to covid-19: Secondary | ICD-10-CM | POA: Insufficient documentation

## 2019-12-14 DIAGNOSIS — Z01812 Encounter for preprocedural laboratory examination: Secondary | ICD-10-CM | POA: Diagnosis present

## 2019-12-14 LAB — SARS CORONAVIRUS 2 (TAT 6-24 HRS): SARS Coronavirus 2: NEGATIVE

## 2019-12-15 MED ORDER — VANCOMYCIN HCL 1500 MG/300ML IV SOLN
1500.0000 mg | INTRAVENOUS | Status: AC
  Administered 2019-12-16: 1500 mg via INTRAVENOUS
  Filled 2019-12-15 (×2): qty 300

## 2019-12-16 ENCOUNTER — Ambulatory Visit (HOSPITAL_COMMUNITY)
Admission: RE | Admit: 2019-12-16 | Discharge: 2019-12-16 | Disposition: A | Discharge status: Home / Self Care | Payer: Medicare Other | Attending: Orthopedic Surgery | Admitting: Orthopedic Surgery

## 2019-12-16 ENCOUNTER — Ambulatory Visit (HOSPITAL_COMMUNITY): Payer: Medicare Other | Admitting: Anesthesiology

## 2019-12-16 ENCOUNTER — Encounter (HOSPITAL_COMMUNITY): Payer: Self-pay | Admitting: Orthopedic Surgery

## 2019-12-16 ENCOUNTER — Encounter (HOSPITAL_COMMUNITY)
Admission: RE | Disposition: A | Discharge status: Home / Self Care | Payer: Self-pay | Source: Home / Self Care | Attending: Orthopedic Surgery

## 2019-12-16 ENCOUNTER — Other Ambulatory Visit: Payer: Self-pay

## 2019-12-16 DIAGNOSIS — M7501 Adhesive capsulitis of right shoulder: Secondary | ICD-10-CM | POA: Insufficient documentation

## 2019-12-16 DIAGNOSIS — M1990 Unspecified osteoarthritis, unspecified site: Secondary | ICD-10-CM | POA: Insufficient documentation

## 2019-12-16 DIAGNOSIS — K219 Gastro-esophageal reflux disease without esophagitis: Secondary | ICD-10-CM | POA: Insufficient documentation

## 2019-12-16 DIAGNOSIS — Z79899 Other long term (current) drug therapy: Secondary | ICD-10-CM | POA: Diagnosis not present

## 2019-12-16 DIAGNOSIS — Z6841 Body Mass Index (BMI) 40.0 and over, adult: Secondary | ICD-10-CM | POA: Diagnosis not present

## 2019-12-16 DIAGNOSIS — I872 Venous insufficiency (chronic) (peripheral): Secondary | ICD-10-CM | POA: Diagnosis not present

## 2019-12-16 DIAGNOSIS — Z791 Long term (current) use of non-steroidal anti-inflammatories (NSAID): Secondary | ICD-10-CM | POA: Diagnosis not present

## 2019-12-16 DIAGNOSIS — G5601 Carpal tunnel syndrome, right upper limb: Secondary | ICD-10-CM | POA: Diagnosis not present

## 2019-12-16 DIAGNOSIS — S46011A Strain of muscle(s) and tendon(s) of the rotator cuff of right shoulder, initial encounter: Secondary | ICD-10-CM | POA: Insufficient documentation

## 2019-12-16 DIAGNOSIS — S46011D Strain of muscle(s) and tendon(s) of the rotator cuff of right shoulder, subsequent encounter: Secondary | ICD-10-CM

## 2019-12-16 DIAGNOSIS — J45909 Unspecified asthma, uncomplicated: Secondary | ICD-10-CM | POA: Diagnosis not present

## 2019-12-16 DIAGNOSIS — Z885 Allergy status to narcotic agent status: Secondary | ICD-10-CM | POA: Diagnosis not present

## 2019-12-16 DIAGNOSIS — E119 Type 2 diabetes mellitus without complications: Secondary | ICD-10-CM | POA: Diagnosis not present

## 2019-12-16 DIAGNOSIS — I10 Essential (primary) hypertension: Secondary | ICD-10-CM | POA: Diagnosis not present

## 2019-12-16 DIAGNOSIS — M7521 Bicipital tendinitis, right shoulder: Secondary | ICD-10-CM | POA: Diagnosis not present

## 2019-12-16 DIAGNOSIS — S43431D Superior glenoid labrum lesion of right shoulder, subsequent encounter: Secondary | ICD-10-CM

## 2019-12-16 DIAGNOSIS — Z880 Allergy status to penicillin: Secondary | ICD-10-CM | POA: Insufficient documentation

## 2019-12-16 DIAGNOSIS — W19XXXA Unspecified fall, initial encounter: Secondary | ICD-10-CM | POA: Insufficient documentation

## 2019-12-16 DIAGNOSIS — Z9181 History of falling: Secondary | ICD-10-CM | POA: Diagnosis not present

## 2019-12-16 HISTORY — PX: SHOULDER ARTHROSCOPY WITH SUBACROMIAL DECOMPRESSION, ROTATOR CUFF REPAIR AND BICEP TENDON REPAIR: SHX5687

## 2019-12-16 HISTORY — PX: CARPAL TUNNEL RELEASE: SHX101

## 2019-12-16 SURGERY — SHOULDER ARTHROSCOPY WITH SUBACROMIAL DECOMPRESSION, ROTATOR CUFF REPAIR AND BICEP TENDON REPAIR
Anesthesia: General | Laterality: Right

## 2019-12-16 MED ORDER — MIDAZOLAM HCL 2 MG/2ML IJ SOLN: 1.0000 mg | Freq: Once | INTRAMUSCULAR | Status: AC

## 2019-12-16 MED ORDER — LACTATED RINGERS IV SOLN: INTRAVENOUS | Status: DC

## 2019-12-16 MED ORDER — FENTANYL CITRATE (PF) 100 MCG/2ML IJ SOLN
INTRAMUSCULAR | Status: AC
  Administered 2019-12-16: 100 ug via INTRAVENOUS
  Filled 2019-12-16: qty 2

## 2019-12-16 MED ORDER — ORAL CARE MOUTH RINSE: 15.0000 mL | Freq: Once | OROMUCOSAL | Status: AC

## 2019-12-16 MED ORDER — CHLORHEXIDINE GLUCONATE 0.12 % MT SOLN
OROMUCOSAL | Status: AC
  Administered 2019-12-16: 15 mL via OROMUCOSAL
  Filled 2019-12-16: qty 15

## 2019-12-16 MED ORDER — DEXAMETHASONE SODIUM PHOSPHATE 10 MG/ML IJ SOLN
INTRAMUSCULAR | Status: DC | PRN
Start: 2019-12-16 — End: 2019-12-16
  Administered 2019-12-16: 8 mg via INTRAVENOUS

## 2019-12-16 MED ORDER — OXYCODONE-ACETAMINOPHEN 5-325 MG PO TABS: 1.0000 | ORAL_TABLET | ORAL | 0 refills | Status: DC | PRN

## 2019-12-16 MED ORDER — FENTANYL CITRATE (PF) 100 MCG/2ML IJ SOLN: 25.0000 ug | INTRAMUSCULAR | Status: DC | PRN

## 2019-12-16 MED ORDER — POVIDONE-IODINE 10 % EX SWAB
2.0000 "application " | Freq: Once | CUTANEOUS | Status: AC
  Administered 2019-12-16: 2 via TOPICAL

## 2019-12-16 MED ORDER — VANCOMYCIN HCL 1000 MG IV SOLR
INTRAVENOUS | Status: DC | PRN
  Administered 2019-12-16: 1500 mg via INTRAVENOUS

## 2019-12-16 MED ORDER — ONDANSETRON HCL 4 MG/2ML IJ SOLN
INTRAMUSCULAR | Status: DC | PRN
  Administered 2019-12-16: 4 mg via INTRAVENOUS

## 2019-12-16 MED ORDER — METHOCARBAMOL 500 MG PO TABS: 500.0000 mg | ORAL_TABLET | Freq: Three times a day (TID) | ORAL | 0 refills | Status: DC | PRN

## 2019-12-16 MED ORDER — 0.9 % SODIUM CHLORIDE (POUR BTL) OPTIME
TOPICAL | Status: DC | PRN
Start: 2019-12-16 — End: 2019-12-16
  Administered 2019-12-16 (×3): 1000 mL

## 2019-12-16 MED ORDER — ACETAMINOPHEN 160 MG/5ML PO SOLN: 325.0000 mg | Freq: Once | ORAL | Status: DC | PRN

## 2019-12-16 MED ORDER — PHENYLEPHRINE 40 MCG/ML (10ML) SYRINGE FOR IV PUSH (FOR BLOOD PRESSURE SUPPORT)
PREFILLED_SYRINGE | INTRAVENOUS | Status: AC
  Filled 2019-12-16: qty 10

## 2019-12-16 MED ORDER — ASPIRIN EC 81 MG PO TBEC
81.0000 mg | DELAYED_RELEASE_TABLET | Freq: Every day | ORAL | 0 refills | Status: AC
Start: 2019-12-16 — End: 2020-12-15

## 2019-12-16 MED ORDER — BUPIVACAINE HCL (PF) 0.5 % IJ SOLN
INTRAMUSCULAR | Status: DC | PRN
Start: 2019-12-16 — End: 2019-12-16
  Administered 2019-12-16: 10 mL via PERINEURAL

## 2019-12-16 MED ORDER — FENTANYL CITRATE (PF) 250 MCG/5ML IJ SOLN
INTRAMUSCULAR | Status: AC
  Filled 2019-12-16: qty 5

## 2019-12-16 MED ORDER — DEXAMETHASONE SODIUM PHOSPHATE 10 MG/ML IJ SOLN
INTRAMUSCULAR | Status: AC
  Filled 2019-12-16: qty 1

## 2019-12-16 MED ORDER — PROMETHAZINE HCL 25 MG/ML IJ SOLN: 6.2500 mg | INTRAMUSCULAR | Status: DC | PRN

## 2019-12-16 MED ORDER — SUCCINYLCHOLINE CHLORIDE 20 MG/ML IJ SOLN
INTRAMUSCULAR | Status: DC | PRN
  Administered 2019-12-16: 140 mg via INTRAVENOUS

## 2019-12-16 MED ORDER — BUPIVACAINE LIPOSOME 1.3 % IJ SUSP
INTRAMUSCULAR | Status: DC | PRN
  Administered 2019-12-16: 10 mL via PERINEURAL

## 2019-12-16 MED ORDER — FENTANYL CITRATE (PF) 100 MCG/2ML IJ SOLN
INTRAMUSCULAR | Status: DC | PRN
  Administered 2019-12-16 (×2): 50 ug via INTRAVENOUS

## 2019-12-16 MED ORDER — MEPERIDINE HCL 25 MG/ML IJ SOLN: 6.2500 mg | INTRAMUSCULAR | Status: DC | PRN

## 2019-12-16 MED ORDER — ROCURONIUM BROMIDE 10 MG/ML (PF) SYRINGE
PREFILLED_SYRINGE | INTRAVENOUS | Status: AC
  Filled 2019-12-16: qty 10

## 2019-12-16 MED ORDER — ACETAMINOPHEN 325 MG PO TABS: 325.0000 mg | ORAL_TABLET | Freq: Once | ORAL | Status: DC | PRN

## 2019-12-16 MED ORDER — PROPOFOL 10 MG/ML IV BOLUS
INTRAVENOUS | Status: DC | PRN
  Administered 2019-12-16: 200 mg via INTRAVENOUS

## 2019-12-16 MED ORDER — SUGAMMADEX SODIUM 200 MG/2ML IV SOLN
INTRAVENOUS | Status: DC | PRN
  Administered 2019-12-16: 400 mg via INTRAVENOUS

## 2019-12-16 MED ORDER — SUCCINYLCHOLINE CHLORIDE 200 MG/10ML IV SOSY
PREFILLED_SYRINGE | INTRAVENOUS | Status: AC
  Filled 2019-12-16: qty 10

## 2019-12-16 MED ORDER — ONDANSETRON HCL 4 MG/2ML IJ SOLN
INTRAMUSCULAR | Status: AC
  Filled 2019-12-16: qty 2

## 2019-12-16 MED ORDER — LIDOCAINE 2% (20 MG/ML) 5 ML SYRINGE
INTRAMUSCULAR | Status: DC | PRN
  Administered 2019-12-16: 100 mg via INTRAVENOUS

## 2019-12-16 MED ORDER — STERILE WATER FOR IRRIGATION IR SOLN
Status: DC | PRN
  Administered 2019-12-16: 1000 mL

## 2019-12-16 MED ORDER — PROPOFOL 10 MG/ML IV BOLUS
INTRAVENOUS | Status: AC
  Filled 2019-12-16: qty 20

## 2019-12-16 MED ORDER — BUPIVACAINE HCL (PF) 0.25 % IJ SOLN
INTRAMUSCULAR | Status: AC
  Filled 2019-12-16: qty 30

## 2019-12-16 MED ORDER — FENTANYL CITRATE (PF) 100 MCG/2ML IJ SOLN: 100.0000 ug | Freq: Once | INTRAMUSCULAR | Status: AC

## 2019-12-16 MED ORDER — SODIUM CHLORIDE 0.9 % IR SOLN
Status: DC | PRN
  Administered 2019-12-16: 3000 mL

## 2019-12-16 MED ORDER — GLYCOPYRROLATE 0.2 MG/ML IJ SOLN
INTRAMUSCULAR | Status: DC | PRN
  Administered 2019-12-16: .2 mg via INTRAVENOUS

## 2019-12-16 MED ORDER — ACETAMINOPHEN 10 MG/ML IV SOLN: 1000.0000 mg | Freq: Once | INTRAVENOUS | Status: DC | PRN

## 2019-12-16 MED ORDER — ROCURONIUM BROMIDE 10 MG/ML (PF) SYRINGE
PREFILLED_SYRINGE | INTRAVENOUS | Status: DC | PRN
  Administered 2019-12-16: 50 mg via INTRAVENOUS
  Administered 2019-12-16: 20 mg via INTRAVENOUS

## 2019-12-16 MED ORDER — PHENYLEPHRINE HCL-NACL 10-0.9 MG/250ML-% IV SOLN
INTRAVENOUS | Status: DC | PRN
  Administered 2019-12-16: 50 ug/min via INTRAVENOUS

## 2019-12-16 MED ORDER — POVIDONE-IODINE 7.5 % EX SOLN
Freq: Once | CUTANEOUS | Status: AC
  Filled 2019-12-16: qty 118

## 2019-12-16 MED ORDER — MIDAZOLAM HCL 2 MG/2ML IJ SOLN
INTRAMUSCULAR | Status: AC
  Filled 2019-12-16: qty 2

## 2019-12-16 MED ORDER — EPHEDRINE SULFATE 50 MG/ML IJ SOLN
INTRAMUSCULAR | Status: DC | PRN
  Administered 2019-12-16 (×2): 5 mg via INTRAVENOUS

## 2019-12-16 MED ORDER — CHLORHEXIDINE GLUCONATE 0.12 % MT SOLN: 15.0000 mL | Freq: Once | OROMUCOSAL | Status: AC

## 2019-12-16 MED ORDER — MIDAZOLAM HCL 2 MG/2ML IJ SOLN
INTRAMUSCULAR | Status: AC
  Administered 2019-12-16: 1 mg via INTRAVENOUS
  Filled 2019-12-16: qty 2

## 2019-12-16 SURGICAL SUPPLY — 97 items
AID PSTN UNV HD RSTRNT DISP (MISCELLANEOUS) ×1
ANCH SUT CRKSW FT 1.3X (Anchor) IMPLANT
ANCH SUT FBRTK 1.3 2 TPE (Anchor) ×3 IMPLANT
ANCH SUT SWLK 19.1X4.75 (Anchor) ×2 IMPLANT
ANCHOR FBRTK 2.6 SUTURETAP 1.3 (Anchor) ×3 IMPLANT
ANCHOR SUT 1.8 FBRTK KNTLS 2SU (Anchor) ×2 IMPLANT
ANCHOR SUT BIO SW 4.75X19.1 (Anchor) ×2 IMPLANT
ANCHOR SUT BIOCOMP CORKSREW (Anchor) IMPLANT
BLADE EXCALIBUR 4.0X13 (MISCELLANEOUS) ×2 IMPLANT
BNDG CMPR 9X4 STRL LF SNTH (GAUZE/BANDAGES/DRESSINGS)
BNDG ELASTIC 3X5.8 VLCR STR LF (GAUZE/BANDAGES/DRESSINGS) ×3 IMPLANT
BNDG ELASTIC 4X5.8 VLCR STR LF (GAUZE/BANDAGES/DRESSINGS) ×2 IMPLANT
BNDG ESMARK 4X9 LF (GAUZE/BANDAGES/DRESSINGS) IMPLANT
BNDG GAUZE ELAST 4 BULKY (GAUZE/BANDAGES/DRESSINGS) ×2 IMPLANT
BURR OVAL 8 FLU 4.0X13 (MISCELLANEOUS) IMPLANT
CLSR STERI-STRIP ANTIMIC 1/2X4 (GAUZE/BANDAGES/DRESSINGS) ×2 IMPLANT
CORD BIPOLAR FORCEPS 12FT (ELECTRODE) ×2 IMPLANT
COVER SURGICAL LIGHT HANDLE (MISCELLANEOUS) ×2 IMPLANT
COVER WAND RF STERILE (DRAPES) ×2 IMPLANT
CUFF TOURN SGL QUICK 18X4 (TOURNIQUET CUFF) ×2 IMPLANT
CUFF TOURN SGL QUICK 24 (TOURNIQUET CUFF)
CUFF TRNQT CYL 24X4X16.5-23 (TOURNIQUET CUFF) IMPLANT
DRAPE INCISE IOBAN 66X45 STRL (DRAPES) ×4 IMPLANT
DRAPE STERI 35X30 U-POUCH (DRAPES) ×2 IMPLANT
DRAPE SURG 17X23 STRL (DRAPES) ×2 IMPLANT
DRAPE U-SHAPE 47X51 STRL (DRAPES) ×4 IMPLANT
DRSG TEGADERM 4X4.75 (GAUZE/BANDAGES/DRESSINGS) ×4 IMPLANT
DRSG XEROFORM 1X8 (GAUZE/BANDAGES/DRESSINGS) ×1 IMPLANT
DURAPREP 26ML APPLICATOR (WOUND CARE) ×2 IMPLANT
DW OUTFLOW CASSETTE/TUBE SET (MISCELLANEOUS) ×2 IMPLANT
ELECT REM PT RETURN 9FT ADLT (ELECTROSURGICAL) ×2
ELECTRODE REM PT RTRN 9FT ADLT (ELECTROSURGICAL) ×1 IMPLANT
GAUZE SPONGE 4X4 12PLY STRL (GAUZE/BANDAGES/DRESSINGS) ×2 IMPLANT
GAUZE SPONGE 4X4 12PLY STRL LF (GAUZE/BANDAGES/DRESSINGS) ×3 IMPLANT
GAUZE XEROFORM 1X8 LF (GAUZE/BANDAGES/DRESSINGS) ×2 IMPLANT
GLOVE BIOGEL PI IND STRL 7.0 (GLOVE) ×1 IMPLANT
GLOVE BIOGEL PI IND STRL 8 (GLOVE) ×1 IMPLANT
GLOVE BIOGEL PI INDICATOR 7.0 (GLOVE) ×1
GLOVE BIOGEL PI INDICATOR 8 (GLOVE) ×1
GLOVE ECLIPSE 7.0 STRL STRAW (GLOVE) ×2 IMPLANT
GLOVE ECLIPSE 8.0 STRL XLNG CF (GLOVE) ×2 IMPLANT
GOWN STRL REUS W/ TWL LRG LVL3 (GOWN DISPOSABLE) ×3 IMPLANT
GOWN STRL REUS W/ TWL XL LVL3 (GOWN DISPOSABLE) ×1 IMPLANT
GOWN STRL REUS W/TWL LRG LVL3 (GOWN DISPOSABLE) ×6
GOWN STRL REUS W/TWL XL LVL3 (GOWN DISPOSABLE) ×2
KIT BASIN OR (CUSTOM PROCEDURE TRAY) ×2 IMPLANT
KIT STR SPEAR 1.8 FBRTK DISP (KITS) ×1 IMPLANT
KIT TURNOVER KIT B (KITS) ×2 IMPLANT
LOOP VESSEL MAXI BLUE (MISCELLANEOUS) IMPLANT
MANIFOLD NEPTUNE II (INSTRUMENTS) ×2 IMPLANT
NDL HYPO 25GX1X1/2 BEV (NEEDLE) IMPLANT
NDL SCORPION MULTI FIRE (NEEDLE) IMPLANT
NDL SPNL 18GX3.5 QUINCKE PK (NEEDLE) ×1 IMPLANT
NDL SUT 6 .5 CRC .975X.05 MAYO (NEEDLE) IMPLANT
NEEDLE HYPO 25GX1X1/2 BEV (NEEDLE) IMPLANT
NEEDLE MAYO TAPER (NEEDLE)
NEEDLE SCORPION MULTI FIRE (NEEDLE) ×2 IMPLANT
NEEDLE SPNL 18GX3.5 QUINCKE PK (NEEDLE) ×2 IMPLANT
NS IRRIG 1000ML POUR BTL (IV SOLUTION) ×2 IMPLANT
PACK ORTHO EXTREMITY (CUSTOM PROCEDURE TRAY) ×2 IMPLANT
PACK SHOULDER (CUSTOM PROCEDURE TRAY) ×2 IMPLANT
PAD ABD 8X10 STRL (GAUZE/BANDAGES/DRESSINGS) ×1 IMPLANT
PAD ARMBOARD 7.5X6 YLW CONV (MISCELLANEOUS) ×4 IMPLANT
PAD CAST 4YDX4 CTTN HI CHSV (CAST SUPPLIES) ×2 IMPLANT
PADDING CAST ABS 4INX4YD NS (CAST SUPPLIES) ×1
PADDING CAST ABS COTTON 4X4 ST (CAST SUPPLIES) IMPLANT
PADDING CAST COTTON 4X4 STRL (CAST SUPPLIES) ×4
PORT APPOLLO RF 90DEGREE MULTI (SURGICAL WAND) ×1 IMPLANT
RESTRAINT HEAD UNIVERSAL NS (MISCELLANEOUS) ×2 IMPLANT
SLING ARM IMMOBILIZER LRG (SOFTGOODS) IMPLANT
SPONGE LAP 4X18 RFD (DISPOSABLE) ×4 IMPLANT
STRIP CLOSURE SKIN 1/2X4 (GAUZE/BANDAGES/DRESSINGS) ×2 IMPLANT
SUCTION FRAZIER HANDLE 10FR (MISCELLANEOUS) ×2
SUCTION TUBE FRAZIER 10FR DISP (MISCELLANEOUS) ×1 IMPLANT
SUT ETHILON 3 0 PS 1 (SUTURE) ×2 IMPLANT
SUT FIBERWIRE #2 38 T-5 BLUE (SUTURE)
SUT MNCRL AB 3-0 PS2 18 (SUTURE) ×1 IMPLANT
SUT VIC AB 0 CT1 27 (SUTURE)
SUT VIC AB 0 CT1 27XBRD ANBCTR (SUTURE) ×1 IMPLANT
SUT VIC AB 1 CT1 27 (SUTURE)
SUT VIC AB 1 CT1 27XBRD ANBCTR (SUTURE) IMPLANT
SUT VIC AB 2-0 CT1 27 (SUTURE)
SUT VIC AB 2-0 CT1 TAPERPNT 27 (SUTURE) ×1 IMPLANT
SUT VIC AB 3-0 FS2 27 (SUTURE) IMPLANT
SUT VICRYL 0 UR6 27IN ABS (SUTURE) ×7 IMPLANT
SUT VICRYL 1 TIES 12X18 (SUTURE) ×1 IMPLANT
SUTURE FIBERWR #2 38 T-5 BLUE (SUTURE) IMPLANT
SUTURE TAPE 1.3 FIBERLOP 20 ST (SUTURE) IMPLANT
SUTURETAPE 1.3 FIBERLOOP 20 ST (SUTURE)
SYR CONTROL 10ML LL (SYRINGE) IMPLANT
SYSTEM CHEST DRAIN TLS 7FR (DRAIN) IMPLANT
TOWEL GREEN STERILE (TOWEL DISPOSABLE) ×2 IMPLANT
TOWEL GREEN STERILE FF (TOWEL DISPOSABLE) ×2 IMPLANT
TUBE CONNECTING 12X1/4 (SUCTIONS) IMPLANT
TUBING ARTHROSCOPY IRRIG 16FT (MISCELLANEOUS) ×2 IMPLANT
UNDERPAD 30X36 HEAVY ABSORB (UNDERPADS AND DIAPERS) ×2 IMPLANT
WATER STERILE IRR 1000ML POUR (IV SOLUTION) ×2 IMPLANT

## 2019-12-16 NOTE — Brief Op Note (Signed)
   12/16/2019  4:04 PM  PATIENT:  Jackelyn Poling Dirks  64 y.o. female  PRE-OPERATIVE DIAGNOSIS:  right shoulder rotator cuff tear, right carpal tunnel syndrome  POST-OPERATIVE DIAGNOSIS:  right shoulder rotator cuff tear, right carpal tunnel syndrome biceps tendonitis  PROCEDURE:  Procedure(s): RIGHT SHOULDER ARTHROSCOPY, BICEPS TENODESIS, MINI OPEN ROTATOR CUFF TEAR REPAIR, DEBRIDEMENT RIGHT CARPAL TUNNEL RELEASE  SURGEON:  Surgeon(s): Marlou Sa, Tonna Corner, MD  ASSISTANT: magnant pa  ANESTHESIA:   general  EBL: 25 ml    Total I/O In: [id number] [I.V.:1000; IV Piggyback:375] Out: -   BLOOD ADMINISTERED: none  DRAINS: none   LOCAL MEDICATIONS USED:  none  SPECIMEN:  No Specimen  COUNTS:  YES  TOURNIQUET:   Total Tourniquet Time Documented: Upper Arm (Right) - 11 minutes Total: Upper Arm (Right) - 11 minutes   DICTATION: .Other Dictation: Dictation Number [id number]  PLAN OF CARE: Discharge to home after PACU  PATIENT DISPOSITION:  PACU - hemodynamically stable

## 2019-12-16 NOTE — Op Note (Signed)
NAME: Jessica [name], Jessica [name] MEDICAL RECORD [id number] ACCOUNT [id number] DATE OF BIRTH:09-Jun-1956 FACILITY: [facility] LOCATION: [facility] PHYSICIAN:Karol Liendo Randel Pigg, MD  OPERATIVE REPORT  DATE OF PROCEDURE:  12/16/2019  PREOPERATIVE DIAGNOSES:   1.  Right shoulder rotator cuff tear.  2.  Biceps tendinitis. 3.  Carpal tunnel syndrome.  POSTOPERATIVE DIAGNOSES:   1.  Right shoulder rotator cuff tear.  2.  Biceps tendinitis. 3.  Carpal tunnel syndrome.  PROCEDURES: 1.  Right shoulder examination under anesthesia with arthroscopy and debridement of the superior labrum and release of the rotator interval. 2.  Mini open rotator cuff tear repair of the supraspinatus and infraspinatus. 3.  Mini open biceps tenodesis. 4.  Open carpal tunnel release.  SURGEON:  Meredith Pel, MD  ASSISTANT:  Annie Main, PA.  INDICATIONS:  This is a 64 year old patient with right shoulder pain following a fall 2 months ago.  He has infraspinatus, supraspinatus tear with retraction.  Also, has biceps tendinitis.  She presents now for operative management after explanation of  risks and benefits.  She also has carpal tunnel syndrome, refractory to nonoperative management.  She also would like to have that fixed at the same time.  PROCEDURE IN DETAIL:  The patient was brought to the operating room where general anesthetic was induced.  Preoperative antibiotics administered.  Timeout was called.  Right shoulder was examined under anesthesia and found to have an early frozen  shoulder, early adhesive capsulitis.  The patient was manipulated from 140 degrees of flexion into full forward flexion.  The patient had isolated glenohumeral abduction to about 90 degrees, manipulated into 110.  Next, after manipulation under  anesthesia, the shoulder was prescrubbed with alcohol and Betadine, allowed to air dry, prepped with DuraPrep solution and draped in sterile manner.  Ioban used to cover the operative field.  Timeout  was called before the manipulation.  The solution of  saline injected into the shoulder joint.  The posterior portal created 2 cm medial and posterior to the posterolateral margin of the acromion.  Diagnostic arthroscopy was performed.  Anterior portal created under direct visualization.  The patient did  have early synovitis within the rotator interval consistent with her preoperative examination under anesthesia.  The rotator interval was released.  Rotator cuff tear involving supraspinatus and infraspinatus was visualized.  The glenohumeral articular  surfaces were generally intact.  Next, the biceps tendon was released using the Arthrocare wand and the rotator interval was also released.  Superior labrum was debrided.  At this time, instruments were removed from the portals.  Portals were closed  using 3-0 nylon.  Next, an anterior incision was made off the anterolateral margin of the acromion.  Deltoid split between the middle and anterior raphae for a distance of 4 cm, marked with #1 Vicryl suture.  A bursectomy performed.  The biceps tendon  was then tenodesed into the bicipital groove using knotless SutureTaks x2.  Next, attention was directed towards the rotator cuff tear.  Four 0 Vicryl sutures were placed in the edge of the rotator cuff tendon, which was then mobilized.  The  coracohumeral ligament was released.  We were able to get this tendon back near to the footprint.  The footprint was then prepared using  a15 blade in transverse fashion.  This gave good bleeding bony surface.  Next, the all SutureTaks were placed at the  intersection of the greater tuberosity and the humeral head.  Bone quality was poor.  Suture tacks did hold.  The 8  limbs of the SutureTaks were then placed equidistant into the infraspinatus and supraspinatus tendon, which was then repaired.  The  suture limbs were tied and then crossed and then incorporated into 2 SwiveLocks using a cross pattern.  This gave a very nice  mesh affect to pull down the rotator cuff back to the tuberosity.  At this time, a very nice watertight repair was achieved.  A  0 Vicryl side-to-side suture was placed at the posterior margin of that rotator cuff tear to  reapproximate a cleft between the supraspinatus and infraspinatus.  Tissue was also reapproximated with the arm in external rotation between the subscap and  supraspinatus.  Next, thorough irrigation was performed.  The arm was taken through range of motion, found to have good range of motion with about 50 degrees of external rotation at 15 degrees of abduction.  Acromioplasty was also performed using a rasp.   Deltoid split was then closed using #1 Vicryl suture, followed by interrupted inverted 0 Vicryl suture, 2-0 Vicryl suture and a 3-0 Monocryl.  Steri-Strips and impervious dressings applied.  Next, attention was directed towards the hand.  A new prep  was performed.  Timeout was called to the hand.  The forearm tourniquet was elevated to 250 mmHg for 11 minutes total.  Incision made at the [facility] cardinal line intersection with the radial border of the 4th finger extending proximally.  Skin and  subcutaneous tissue were sharply divided.  The transverse carpal ligament was visualized and divided along its midsection for 2-3 mm.  Right angle retractor was then placed between the transverse carpal ligament and the median nerve and then under direct  visualization, released distally to the neurovascular bundle and then proximally to the forearm fascia using a saw retractor.  Thorough irrigation was performed.  Tourniquet released.  Bleeding points encountered were controlled using bipolar  electrocautery.  The skin then closed using a 3-0 nylon suture.  Well-padded volar splint applied.  Shoulder sling also applied.  The patient tolerated the procedure well without immediate complications.  She was transferred to the recovery room in  stable condition.  Luke's assistance was required for  the shoulder case for retraction, opening and closing, mobilization of tissue.  His assistance was a medical necessity for the shoulder.  Luke's assistance was also required for the carpal tunnel  release for retraction, opening and closing, tissue manipulation.  His assistance was a medical necessity for that case as well.  VN/NUANCE  D:12/16/2019 T:12/16/2019 JOB:[id number]

## 2019-12-16 NOTE — Anesthesia Preprocedure Evaluation (Addendum)
Anesthesia Evaluation  Patient identified by MRN, date of birth, ID band Patient awake    Reviewed: Allergy & Precautions, NPO status , Patient's Chart, lab work & pertinent test results  History of Anesthesia Complications (+) PONV and history of anesthetic complications  Airway Mallampati: II  TM Distance: >3 FB     Dental  (+) Dental Advisory Given   Pulmonary asthma , pneumonia,    breath sounds clear to auscultation       Cardiovascular hypertension, Pt. on medications  Rhythm:Regular Rate:Normal     Neuro/Psych negative neurological ROS  negative psych ROS   GI/Hepatic Neg liver ROS, hiatal hernia, GERD  ,  Endo/Other  Morbid obesity  Renal/GU negative Renal ROS     Musculoskeletal  (+) Arthritis ,   Abdominal (+) + obese,   Peds  Hematology  (+) Blood dyscrasia, anemia ,   Anesthesia Other Findings   Reproductive/Obstetrics negative OB ROS                          Anesthesia Physical Anesthesia Plan  ASA: III  Anesthesia Plan: General   Post-op Pain Management: GA combined w/ Regional for post-op pain   Induction: Intravenous  PONV Risk Score and Plan: 4 or greater and Ondansetron, Dexamethasone, Treatment may vary due to age or medical condition and Midazolam  Airway Management Planned: Oral ETT  Additional Equipment:   Intra-op Plan:   Post-operative Plan: Extubation in OR  Informed Consent: I have reviewed the patients History and Physical, chart, labs and discussed the procedure including the risks, benefits and alternatives for the proposed anesthesia with the patient or authorized representative who has indicated his/her understanding and acceptance.     Dental advisory given  Plan Discussed with: CRNA  Anesthesia Plan Comments:         Anesthesia Quick Evaluation

## 2019-12-16 NOTE — Anesthesia Procedure Notes (Signed)
Procedure Name: Intubation Date/Time: 12/16/2019 12:30 PM Performed by: Lavell Luster, CRNA Pre-anesthesia Checklist: Patient identified, Emergency Drugs available, Suction available, Patient being monitored and Timeout performed Patient Re-evaluated:Patient Re-evaluated prior to induction Oxygen Delivery Method: Circle system utilized Preoxygenation: Pre-oxygenation with 100% oxygen Induction Type: IV induction Ventilation: Mask ventilation without difficulty Laryngoscope Size: Mac and 3 Grade View: Grade I Tube type: Oral Tube size: 7.0 mm Number of attempts: 1 Airway Equipment and Method: Video-laryngoscopy and Stylet Placement Confirmation: ETT inserted through vocal cords under direct vision,  breath sounds checked- equal and bilateral and positive ETCO2 Secured at: 22 cm Tube secured with: Tape Dental Injury: Teeth and Oropharynx as per pre-operative assessment  Difficulty Due To: Difficulty was anticipated

## 2019-12-16 NOTE — H&P (Signed)
Jessica [name] is an 64 y.o. female.   Chief Complaint: Right hand and right shoulder pain HPI: Jessica [name] is a 64 year old Jessica [name] with right hand and right shoulder pain.  She has known EMG proven carpal tunnel syndrome which has been refractory to nonoperative management.  She reports numbness and tingling as well as loss of dexterity affecting the hand on a daily basis.  Jessica [name] also sustained a fall in March on that right-hand side.  Subsequent MRI scanning shows retracted tears of the central supraspinatus and infraspinatus with some mild atrophy of both muscle bellies.  No prior problems with the right shoulder before the fall.  She does report functional disability and limitation of activities of daily living due to her right shoulder pain and weakness.  Jessica [name] does have diabetes but her hemoglobin A1c has indicated that her blood glucose has been under good control.  Past Medical History:  Diagnosis Date  . Anemia    per Jessica [name], had it years ago before hysterectomy years ago  . Arthritis   . Asthma   . Bronchitis   . Carpal tunnel syndrome of right wrist   . Chronic venous insufficiency   . GERD (gastroesophageal reflux disease)   . History of hiatal hernia   . Hypertension   . Pneumonia   . PONV (postoperative nausea and vomiting)     Past Surgical History:  Procedure Laterality Date  . ABDOMINAL HYSTERECTOMY    . COLONOSCOPY  06/05/2012   Procedure: COLONOSCOPY;  Surgeon: Rogene Houston, MD;  Location: [facility];  Service: Endoscopy;  Laterality: N/A;  125-changed to 1225 Ann to notify pt  . NISSEN FUNDOPLICATION     x2    History reviewed. No pertinent family history. Social History:  reports that she has never smoked. She has never used smokeless tobacco. She reports that she does not drink alcohol or use drugs.  Allergies:  Allergies  Allergen Reactions  . Codeine Other (See Comments)    Severe headaches   . Tetanus Antitoxin Swelling and Other (See Comments)     Fever   . Tetanus Toxoids Swelling and Other (See Comments)    fever  . Penicillins Rash    Medications Prior to Admission  Medication Sig Dispense Refill  . Calcium-Phosphorus-Vitamin D (CITRACAL +D3 PO) Take 1 tablet by mouth daily with supper.    . cetirizine (ZYRTEC) 10 MG tablet Take 10 mg by mouth daily as needed (allergies.).     Marland Kitchen Coenzyme Q10-Vitamin E (QUNOL ULTRA COQ10) 100-150 MG-UNIT CAPS Take 1 capsule by mouth daily with supper.    . esomeprazole (NEXIUM) 20 MG capsule Take 20 mg by mouth daily with supper.    . furosemide (LASIX) 40 MG tablet Take 1 tablet (40 mg total) by mouth daily. (Jessica [name] taking differently: Take 40 mg by mouth daily as needed (fluid retention/swelling.). ) 90 tablet 1  . HYDROcodone-acetaminophen (NORCO) 5-325 MG tablet Take 1 tablet by mouth every 6 (six) hours as needed for moderate pain. 20 tablet 0  . naproxen sodium (ANAPROX) 220 MG tablet Take 220-440 mg by mouth 3 (three) times daily as needed (pain. (Max 3 tablets/24 hrs.)).     Marland Kitchen ramipril (ALTACE) 5 MG capsule Take 1 capsule by mouth once daily (Jessica [name] taking differently: Take 5 mg by mouth daily with supper. ) 90 capsule 0  . albuterol (PROVENTIL HFA;VENTOLIN HFA) 108 (90 Base) MCG/ACT inhaler Inhale 2 puffs into the lungs every 4 (four) hours as needed for wheezing  or shortness of breath. 1 Inhaler 0  . fluticasone (FLONASE) 50 MCG/ACT nasal spray Place 2 sprays into both nostrils daily. (Jessica [name] taking differently: Place 2 sprays into both nostrils daily as needed for allergies. ) 16 g 2  . sodium chloride (MURO 128) 5 % ophthalmic ointment Place 1 drop into the left eye at bedtime as needed (irritated/dry eyes).       No results found for this or any previous visit (from the past 48 hour(s)). No results found.  Review of Systems  Musculoskeletal: Positive for arthralgias.  All other systems reviewed and are negative.   Blood pressure (!) 147/71, pulse 89, temperature 97.9 F  (36.6 C), temperature source Oral, resp. rate 19, height 5\' 4"  (1.626 m), weight 130.6 kg, SpO2 99 %. Physical Exam  Constitutional: She appears well-developed.  HENT:  Head: Normocephalic.  Eyes: Pupils are equal, round, and reactive to light.  Cardiovascular: Normal rate.  Respiratory: Effort normal.  Musculoskeletal:     Cervical back: Normal range of motion.  Neurological: She is alert.  Skin: Skin is warm.  Psychiatric: She has a normal mood and affect.  Ortho exam demonstrates diminished active range of motion of the right shoulder.  Infraspinatus supraspinatus weakness is present.  Subscap strength intact.  Passive range of motion maintained.  Negative Tinel's cubital tunnel.  Motor sensory function of the hand otherwise intact with positive compression testing over the median nerve.  EPL FPL interosseous strength intact.  Assessment/Plan Impression is rotator cuff tear infraspinatus supraspinatus by MRI scanning.  Jessica [name] 64 years old.  She does have functional disability with the shoulder.  Sustained a fall in March.  Plan is arthroscopy with biceps tendon release and debridement with mini open rotator cuff tear repair.  Risk benefits are discussed include not limited to infection nerve vessel damage shoulder stiffness incomplete repair as well as incomplete pain relief and incomplete restoration of full function.  Jessica [name] also would like to have her EMG nerve study proven carpal tunnel syndrome treated.  She is failed conservative management.  Risk benefits of the carpal tunnel surgery also discussed include not limited to infection nerve vessel damage hand weakness and stiffness as well as potential need for revision surgery in the future.  All questions answered  Anderson Malta, MD 12/16/2019, 12:05 PM

## 2019-12-16 NOTE — Anesthesia Procedure Notes (Signed)
Anesthesia Regional Block: Interscalene brachial plexus block   Pre-Anesthetic Checklist: ,, timeout performed, Correct Patient, Correct Site, Correct Laterality, Correct Procedure, Correct Position, site marked, Risks and benefits discussed,  Surgical consent,  Pre-op evaluation,  At surgeon's request and post-op pain management  Laterality: Right  Prep: chloraprep       Needles:  Injection technique: Single-shot  Needle Type: Echogenic Stimulator Needle     Needle Length: 5cm  Needle Gauge: 22     Additional Needles:   Narrative:  Start time: 12/16/2019 12:18 PM End time: 12/16/2019 12:28 PM Injection made incrementally with aspirations every 5 mL.  Performed by: Personally  Anesthesiologist: Duane Boston, MD  Additional Notes: Functioning IV was confirmed and monitors applied.  A 38mm 22ga echogenic arrow stimulator was used. Sterile prep and drape,hand hygiene and sterile gloves were used.Ultrasound guidance: relevant anatomy identified, needle position confirmed, local anesthetic spread visualized around nerve(s)., vascular puncture avoided.  Image printed for medical record.  Negative aspiration and negative test dose prior to incremental administration of local anesthetic. The patient tolerated the procedure well.

## 2019-12-16 NOTE — Transfer of Care (Signed)
Immediate Anesthesia Transfer of Care Note  Patient: Jessica [name]  Procedure(s) Performed: RIGHT SHOULDER ARTHROSCOPY, BICEPS TENODESIS, MINI OPEN ROTATOR CUFF TEAR REPAIR, DEBRIDEMENT (Right ) RIGHT CARPAL TUNNEL RELEASE (Right )  Patient Location: PACU  Anesthesia Type:GA combined with regional for post-op pain  Level of Consciousness: drowsy  Airway & Oxygen Therapy: Patient Spontanous Breathing and Patient connected to face mask oxygen  Post-op Assessment: Report given to RN, Post -op Vital signs reviewed and stable and Patient moving all extremities  Post vital signs: Reviewed and stable  Last Vitals:  Vitals Value Taken Time  BP 105/54 12/16/19 1609  Temp    Pulse 80 12/16/19 1612  Resp 24 12/16/19 1612  SpO2 90 % 12/16/19 1612  Vitals shown include unvalidated device data.  Last Pain:  Vitals:   12/16/19 1225  TempSrc:   PainSc: 0-No pain      Patients Stated Pain Goal: 3 ([id number])  Complications: No apparent anesthesia complications

## 2019-12-17 ENCOUNTER — Encounter: Payer: Self-pay | Admitting: *Deleted

## 2019-12-17 NOTE — Anesthesia Postprocedure Evaluation (Signed)
Anesthesia Post Note  Patient: Jessica [name]  Procedure(s) Performed: RIGHT SHOULDER ARTHROSCOPY, BICEPS TENODESIS, MINI OPEN ROTATOR CUFF TEAR REPAIR, DEBRIDEMENT (Right ) RIGHT CARPAL TUNNEL RELEASE (Right )     Patient location during evaluation: PACU Anesthesia Type: General Level of consciousness: awake and alert Pain management: pain level controlled Vital Signs Assessment: post-procedure vital signs reviewed and stable Respiratory status: spontaneous breathing, nonlabored ventilation, respiratory function stable and patient connected to nasal cannula oxygen Cardiovascular status: blood pressure returned to baseline and stable Postop Assessment: no apparent nausea or vomiting Anesthetic complications: no    Last Vitals:  Vitals:   12/16/19 1702 12/16/19 1703  BP:  108/63  Pulse: 81 81  Resp: 18 16  Temp:  36.7 C  SpO2: 90% 93%    Last Pain:  Vitals:   12/16/19 1703  TempSrc:   PainSc: 0-No pain                 Effie Berkshire

## 2019-12-21 ENCOUNTER — Encounter: Payer: Self-pay | Admitting: Orthopedic Surgery

## 2019-12-21 NOTE — Telephone Encounter (Signed)
Ok to dc splint and cover incision with dry dressing thx

## 2019-12-23 ENCOUNTER — Encounter: Payer: Self-pay | Admitting: Orthopedic Surgery

## 2019-12-23 ENCOUNTER — Other Ambulatory Visit: Payer: Self-pay | Admitting: Surgical

## 2019-12-23 MED ORDER — OXYCODONE-ACETAMINOPHEN 5-325 MG PO TABS: 1.0000 | ORAL_TABLET | Freq: Four times a day (QID) | ORAL | 0 refills | Status: DC | PRN

## 2019-12-23 NOTE — Telephone Encounter (Signed)
SUBMITTED

## 2019-12-24 ENCOUNTER — Other Ambulatory Visit: Payer: Self-pay

## 2019-12-24 ENCOUNTER — Ambulatory Visit (INDEPENDENT_AMBULATORY_CARE_PROVIDER_SITE_OTHER): Payer: Medicare Other | Admitting: Orthopedic Surgery

## 2019-12-24 ENCOUNTER — Encounter: Payer: Self-pay | Admitting: Orthopedic Surgery

## 2019-12-24 DIAGNOSIS — S46811A Strain of other muscles, fascia and tendons at shoulder and upper arm level, right arm, initial encounter: Secondary | ICD-10-CM

## 2019-12-24 NOTE — Progress Notes (Signed)
Post-Op Visit Note   Patient: Jessica [name]           Date of Birth: 11-Aug-1955           MRN: [id number] Visit Date: 12/24/2019 PCP: Susy Frizzle, MD   Assessment & Plan:  Chief Complaint:  Chief Complaint  Patient presents with  . Left Shoulder - Follow-up  . Right Hand - Follow-up   Visit Diagnoses: No diagnosis found.  Plan: Patient is a 64 year old female who presents s/p right shoulder rotator cuff repair and right carpal tunnel release on 12/16/19.  Patient notes that her pain with severe several days after surgery but it is improving now.  She has been taking narcotic medication every 4 hours as needed.  This was refilled yesterday and now she is taking it every 6 hours.  She is able to sleep well with the medication.  Incisions are healing well.  Sutures are removed from hand and shoulder.  No signs of infection including erythema, drainage, dehiscence.  Denies any fevers or chills.  On exam shoulder range of motion demonstrates 80 degrees abduction, 80 degrees forward flexion, 15 to 20 degrees external rotation.  Plan to continue sling for 2 more weeks.  Start right shoulder PT next week to focus on passive range of motion and active assist range of motion with home exercise program 2 times a week for 6 weeks.  Follow-up in 4 weeks for clinical recheck.  Follow-Up Instructions: No follow-ups on file.   Orders:  No orders of the defined types were placed in this encounter.  No orders of the defined types were placed in this encounter.   Imaging: No results found.  PMFS History: Patient Active Problem List   Diagnosis Date Noted  . Traumatic tear of supraspinatus tendon of right shoulder 11/18/2019  . Tear of right infraspinatus tendon 11/18/2019  . Right carpal tunnel syndrome 11/04/2019  . Acute pain of right shoulder 11/04/2019  . Hypertension 04/12/2019  . Glucose intolerance 04/12/2019  . Chronic venous insufficiency 04/06/2019  . Osteopenia 04/06/2019    Past Medical History:  Diagnosis Date  . Anemia    per patient, had it years ago before hysterectomy years ago  . Arthritis   . Asthma   . Bronchitis   . Carpal tunnel syndrome of right wrist   . Chronic venous insufficiency   . GERD (gastroesophageal reflux disease)   . History of hiatal hernia   . Hypertension   . Pneumonia   . PONV (postoperative nausea and vomiting)     No family history on file.  Past Surgical History:  Procedure Laterality Date  . ABDOMINAL HYSTERECTOMY    . CARPAL TUNNEL RELEASE Right 12/16/2019   Procedure: RIGHT CARPAL TUNNEL RELEASE;  Surgeon: Meredith Pel, MD;  Location: [place];  Service: Orthopedics;  Laterality: Right;  . COLONOSCOPY  06/05/2012   Procedure: COLONOSCOPY;  Surgeon: Rogene Houston, MD;  [facility];  Service: Endoscopy;  Laterality: N/A;  125-changed to 1225 Ann to notify pt  . NISSEN FUNDOPLICATION     x2  . SHOULDER ARTHROSCOPY WITH SUBACROMIAL DECOMPRESSION, ROTATOR CUFF REPAIR AND BICEP TENDON REPAIR Right 12/16/2019   Procedure: RIGHT SHOULDER ARTHROSCOPY, BICEPS TENODESIS, MINI OPEN ROTATOR CUFF TEAR REPAIR, DEBRIDEMENT;  Surgeon: Meredith Pel, MD;  Location: [place];  Service: Orthopedics;  Laterality: Right;   Social History   Occupational History  . Not on file  Tobacco Use  . Smoking status: Never  Smoker  . Smokeless tobacco: Never Used  Vaping Use  . Vaping Use: Never used  Substance and Sexual Activity  . Alcohol use: No  . Drug use: No  . Sexual activity: Not on file

## 2020-01-05 ENCOUNTER — Other Ambulatory Visit: Payer: Self-pay

## 2020-01-05 ENCOUNTER — Encounter (HOSPITAL_COMMUNITY): Payer: Self-pay | Admitting: Specialist

## 2020-01-05 ENCOUNTER — Ambulatory Visit (HOSPITAL_COMMUNITY): Payer: Medicare Other | Attending: Orthopedic Surgery | Admitting: Specialist

## 2020-01-05 DIAGNOSIS — M25631 Stiffness of right wrist, not elsewhere classified: Secondary | ICD-10-CM | POA: Diagnosis present

## 2020-01-05 DIAGNOSIS — R29898 Other symptoms and signs involving the musculoskeletal system: Secondary | ICD-10-CM

## 2020-01-05 DIAGNOSIS — M25611 Stiffness of right shoulder, not elsewhere classified: Secondary | ICD-10-CM | POA: Diagnosis not present

## 2020-01-05 DIAGNOSIS — M25531 Pain in right wrist: Secondary | ICD-10-CM

## 2020-01-05 DIAGNOSIS — M25511 Pain in right shoulder: Secondary | ICD-10-CM

## 2020-01-05 NOTE — Patient Instructions (Signed)

## 2020-01-05 NOTE — Therapy (Signed)
[place], [place], [address] Phone: [phone]   Fax:  [phone]  Occupational Therapy Evaluation  Patient Details  Name: Jessica [name] MRN: [id number] Date of Birth: 1955-08-09 Referring Provider (OT): Dr. Cheral Bay   Encounter Date: 01/05/2020   OT End of Session - 01/05/20 1127    Visit Number 1    Number of Visits 16    Date for OT Re-Evaluation 03/01/20   mini reassess on 7/21   Authorization Type Medicare and BCBS    Progress Note Due on Visit 10    OT Start Time 0945    OT Stop Time 1035    OT Time Calculation (min) 50 min    Activity Tolerance Patient tolerated treatment well    Behavior During Therapy [facility] for tasks assessed/performed           Past Medical History:  Diagnosis Date  . Anemia    per patient, had it years ago before hysterectomy years ago  . Arthritis   . Asthma   . Bronchitis   . Carpal tunnel syndrome of right wrist   . Chronic venous insufficiency   . GERD (gastroesophageal reflux disease)   . History of hiatal hernia   . Hypertension   . Pneumonia   . PONV (postoperative nausea and vomiting)     Past Surgical History:  Procedure Laterality Date  . ABDOMINAL HYSTERECTOMY    . CARPAL TUNNEL RELEASE Right 12/16/2019   Procedure: RIGHT CARPAL TUNNEL RELEASE;  Surgeon: Meredith Pel, MD;  Location: [place];  Service: Orthopedics;  Laterality: Right;  . COLONOSCOPY  06/05/2012   Procedure: COLONOSCOPY;  Surgeon: Rogene Houston, MD;  [facility];  Service: Endoscopy;  Laterality: N/A;  125-changed to 1225 Ann to notify pt  . NISSEN FUNDOPLICATION     x2  . SHOULDER ARTHROSCOPY WITH SUBACROMIAL DECOMPRESSION, ROTATOR CUFF REPAIR AND BICEP TENDON REPAIR Right 12/16/2019   Procedure: RIGHT SHOULDER ARTHROSCOPY, BICEPS TENODESIS, MINI OPEN ROTATOR CUFF TEAR REPAIR, DEBRIDEMENT;  Surgeon: Meredith Pel, MD;  Location: [place];  Service: Orthopedics;   Laterality: Right;    There were no vitals filed for this visit.   Subjective Assessment - 01/05/20 1121    Subjective  S:  I fell in March and injured my shoulder.  I didnt have surgery until June, and at the same time I had a carpal tunnel surgery.  I am very active and want to get back to using my arm.    Pertinent History Mrs. Spath reports that she fell going down her garage steps in March, landing on her right outstretched arm, rupturing her supraspinatus muscle.  Mrs. Reta consulted with Dr. Marlou Sa and had R RCR and biceps tenodesis, and carpal tunnel release (previous injury) on 12/16/19.  Patient has been referred to occupational therapy for evaluation and treatment of her right shoulder and wrist/hand s/p surgery.    Special Tests FOTO - complete at next session    Patient Stated Goals I want to be independent and be able to do everything I want to do with my right hand.    Currently in Pain? Yes    Pain Score 4     Pain Location Shoulder    Pain Orientation Right    Pain Descriptors / Indicators Aching    Pain Type Acute pain    Pain Onset More than a month ago    Pain Frequency Intermittent  Aggravating Factors  movement    Pain Relieving Factors rest    Effect of Pain on Daily Activities moderate    Multiple Pain Sites Yes    Pain Score 4    Pain Location Wrist    Pain Orientation Right    Pain Descriptors / Indicators Tender    Pain Type Acute pain    Pain Onset 1 to 4 weeks ago    Pain Frequency Intermittent    Aggravating Factors  incision site    Pain Relieving Factors rest    Effect of Pain on Daily Activities movement, pressure on incision area             [facility] OT Assessment - 01/05/20 0001      Assessment   Medical Diagnosis S/P R RCR, Biceps Tenodesis, and CTR    Referring Provider (OT) Dr. Cheral Bay    Onset Date/Surgical Date 12/16/19    Hand Dominance Right    Next MD Visit 01/06/20      Precautions   Precautions Shoulder    Type of  Shoulder Precautions passive and aa/rom of right shoulder, progress to a/rom on 7/1.  sling until 6/26.  wrist:  progress as tolerated       Restrictions   Weight Bearing Restrictions No      Balance Screen   Has the patient fallen in the past 6 months Yes    How many times? 1    Has the patient had a decrease in activity level because of a fear of falling?  No    Is the patient reluctant to leave their home because of a fear of falling?  No      Home  Environment   Family/patient expects to be discharged to: Private residence    Living Arrangements Spouse/significant other      Prior Function   Level of [place] Retired    Leisure enjoys gardening, swimming, caring for her grandchildren, caring for her mother, active in church, she shed      ADL   ADL comments unable to use her dominant right arm with desired daily tasks due to recent surgeries.  would like to be able to care for her mom, garden, swim, complete craft projects      Written Expression   Dominant Hand Right      Vision - History   Baseline Vision No visual deficits      Observation/Other Assessments   Focus on Therapeutic Outcomes (FOTO)  complete FOTO at next visit       Sensation   Light Touch Appears Intact      Coordination   Gross Motor Movements are Fluid and Coordinated Yes    Coordination able to oppose to SF tip, lacks 1" from opposing SF MCPJ - may need to complete formal [place] assessment      ROM / Strength   AROM / PROM / Strength AROM;PROM;Strength      Palpation   Palpation comment moderate fascial restrictions in right shoulder region.  right wrist healing surgical incision with minimal fascial restricitons       AROM   Overall AROM Comments assessed in seated for wrist, shoulder not assessed    AROM Assessment Site Shoulder;Forearm;Wrist    Right/Left Shoulder Right    Right/Left Forearm Right    Right Forearm Pronation 80 Degrees    Right Forearm Supination  70 Degrees    Right/Left Wrist Right  Right Wrist Extension 64 Degrees    Right Wrist Flexion 64 Degrees    Right Wrist Radial Deviation 50 Degrees    Right Wrist Ulnar Deviation 30 Degrees      PROM   Overall PROM Comments assessed in supine, external and internal with shoulder adducted, wrist P/ROM is [facility]    PROM Assessment Site Shoulder    Right/Left Shoulder Right    Right Shoulder Flexion 110 Degrees    Right Shoulder ABduction 100 Degrees    Right Shoulder Internal Rotation 65 Degrees    Right Shoulder External Rotation 65 Degrees      Strength   Overall Strength Comments not assessed due to recent surgery    Strength Assessment Site Shoulder;Forearm;Wrist    Right/Left Shoulder Right    Right/Left Forearm Right    Right/Left Wrist Right      Hand Function   Right Hand Gross Grasp Functional    Right Hand Grip (lbs) 23    Right Hand Lateral Pinch 10 lbs    Right Hand 3 Point Pinch 8 lbs    Left Hand Grip (lbs) 75    Left Hand Lateral Pinch 18 lbs    Left 3 point pinch 15 lbs                    OT Treatments/Exercises (OP) - 01/05/20 0001      Exercises   Exercises Shoulder      Manual Therapy   Manual Therapy Other (comment)    Other Manual Therapy woundcare:  debrided right carpal tunnel surgical incision, removing dried skin from incision, removing 2 scabbed areas on incision.  applied medihoney gel and gauze.                   OT Education - 01/05/20 1126    Education Details reviewed HEP for table slides and tendon glides also issued medihoney gel for application to carpal tunnel incision area and instructed to continue use to promote healing during day and leave incision open to air at night.    Person(s) Educated Patient    Methods Explanation;Demonstration;Handout    Comprehension Verbalized understanding;Returned demonstration            OT Short Term Goals - 01/05/20 1133      OT SHORT TERM GOAL #1   Title Patient will be  educated and independent with HEP for RUE A/ROM and strength.    Time 4    Period Weeks    Status New    Target Date 02/02/20      OT SHORT TERM GOAL #2   Title Patient will demonstrate WFL P/ROM in right shoulder, forearm, wrist, and hand in order to improve independence with B/ADLs.    Time 4    Period Weeks    Status New      OT SHORT TERM GOAL #3   Title Patient will demonstrate 3+/5 or better strength in her right shoulder, forearm, wrist in order to assist with caring for her mother and complete gardening tasks.    Time 4    Period Weeks    Status New      OT SHORT TERM GOAL #4   Title Patient will improve right grip and pinch strength by 50% for improved ability to open jars and containers.    Time 4    Period Weeks    Status New      OT SHORT TERM GOAL #5   Title Patient  will decrease pain to 3/10 or better in her RUE in order to complete ADLs with less difficulty.    Time 4    Period Weeks    Status New             OT Long Term Goals - 01/05/20 1136      OT LONG TERM GOAL #1   Title Patient will return to prior level of independence using right arm and hand as dominant with all desired B/IADLs, leisure activities.    Time 8    Period Weeks    Status New    Target Date 03/01/20      OT LONG TERM GOAL #2   Title Patient will improve RUE A/ROM to WNL for improved ability to reach overhead, behind back and use hand to push self out of a chair.    Time 8    Period Weeks    Status New      OT LONG TERM GOAL #3   Title Patient will improve right upper extremity strength to 5/5 for improved ability to lift tools and bags of soil when gardening and pick up laundry baskets.    Time 8    Period Weeks    Status New      OT LONG TERM GOAL #4   Title Patient will improve right grip strength to 65 pounds and pinch strength to 15 pounds for improved ability to open containers and complete craft projects.    Time 8    Period Weeks    Status New      OT LONG TERM  GOAL #5   Title Patient will decrease right upper extremity pain to 2/10 or better while completing ADLs, and leisure activities.    Time 8    Period Weeks    Status New                 Plan - 01/05/20 1128    Clinical Impression Statement A:  Patient is a 64 year old female who fell in March, rupturing her supraspinatus muscle.  Patient had right rcr, biceps tenodesis, and carpal tunnel release surgery on 12/16/19.  Patient presents today with right arm in sling and right carpal tunnel incision site covered.  Patient reports she has been using CPM machine for HEP for shoulder and has mild tenderness in carpal tunnel incision area.  Patient is a very active retired adult, and is not able to use her dominant right arm with any of her B/IADLs, leisure, or other daily activities due to recent surgery.    OT Occupational Profile and History Detailed Assessment- Review of Records and additional review of physical, cognitive, psychosocial history related to current functional performance    Occupational performance deficits (Please refer to evaluation for details): ADL's;IADL's;Social Participation;Leisure    Body Structure / Function / Physical Skills ADL;Strength;Pain;UE functional use;ROM;IADL;Fascial restriction;Scar mobility;Wound;Coordination;FMC;Muscle spasms;Skin integrity;Decreased knowledge of precautions    Rehab Potential Good    Clinical Decision Making Several treatment options, min-mod task modification necessary    Comorbidities Affecting Occupational Performance: May have comorbidities impacting occupational performance    Modification or Assistance to Complete Evaluation  No modification of tasks or assist necessary to complete eval    OT Frequency 2x / week    OT Duration 8 weeks    OT Treatment/Interventions Self-care/ADL training;Moist Heat;DME and/or AE instruction;Splinting;Therapeutic activities;Ultrasound;Therapeutic exercise;Scar mobilization;Passive range of  motion;Neuromuscular education;Cryotherapy;Iontophoresis;Electrical Stimulation;Energy conservation;Manual Therapy;Dry needling;Other (comment)   wound care as needed   Plan  P:  Skilled OT intervention to improve p/aa/arom and strength of right shoulder, forearm, wrist and hand to WNL in order to return to PLOF with all desired daily activities.  Next session:  follow up on HEP and status of capal tunnel incision site, begin manual therapy to shoulder, forearm, wrist, hand, p/rom progressing aa/rom of shoulder, and a/rom of wrist and hand.    OT Home Exercise Plan inital evaluation:  table slides, tendon glides    Consulted and Agree with Plan of Care Patient           Patient will benefit from skilled therapeutic intervention in order to improve the following deficits and impairments:   Body Structure / Function / Physical Skills: ADL, Strength, Pain, UE functional use, ROM, IADL, Fascial restriction, Scar mobility, Wound, Coordination, FMC, Muscle spasms, Skin integrity, Decreased knowledge of precautions       Visit Diagnosis: Stiffness of right shoulder, not elsewhere classified - Plan: Ot plan of care cert/re-cert  Stiffness of right wrist, not elsewhere classified - Plan: Ot plan of care cert/re-cert  Acute pain of right shoulder - Plan: Ot plan of care cert/re-cert  Pain in right wrist - Plan: Ot plan of care cert/re-cert  Other symptoms and signs involving the musculoskeletal system - Plan: Ot plan of care cert/re-cert    Problem List Patient Active Problem List   Diagnosis Date Noted  . Traumatic tear of supraspinatus tendon of right shoulder 11/18/2019  . Tear of right infraspinatus tendon 11/18/2019  . Right carpal tunnel syndrome 11/04/2019  . Acute pain of right shoulder 11/04/2019  . Hypertension 04/12/2019  . Glucose intolerance 04/12/2019  . Chronic venous insufficiency 04/06/2019  . Osteopenia 04/06/2019    Vangie Bicker, [place],  OTR/L [phone]  01/05/2020, 12:30 PM  [facility] [address] [place], [place], [address] Phone: [phone]   Fax:  [phone]  Name: MIRTHA JAIN MRN: [id number] Date of Birth: February 12, 1956

## 2020-01-07 ENCOUNTER — Ambulatory Visit (INDEPENDENT_AMBULATORY_CARE_PROVIDER_SITE_OTHER): Payer: Medicare Other | Admitting: Orthopedic Surgery

## 2020-01-07 ENCOUNTER — Ambulatory Visit: Payer: Medicare Other | Admitting: Orthopaedic Surgery

## 2020-01-07 ENCOUNTER — Encounter: Payer: Self-pay | Admitting: Orthopedic Surgery

## 2020-01-07 DIAGNOSIS — S46811A Strain of other muscles, fascia and tendons at shoulder and upper arm level, right arm, initial encounter: Secondary | ICD-10-CM

## 2020-01-07 NOTE — Progress Notes (Signed)
Post-Op Visit Note   Patient: Jessica [name]           Date of Birth: 03-15-1956           MRN: [id number] Visit Date: 01/07/2020 PCP: Susy Frizzle, MD   Assessment & Plan:  Chief Complaint:  Chief Complaint  Patient presents with  . Right Shoulder - Pain   Visit Diagnoses: No diagnosis found.  Plan: Kavitha is now about 2 weeks out right shoulder rotator cuff repair and carpal tunnel release.  She has been in the sling.  She did have a rotator cuff tear but also early adhesive capsulitis.  She is up to 131 on the CPM machine.  I think that is very good progress.  She is going to therapy 2 times a week.  On exam she has pretty good passive range of motion of the shoulder.  The hand looks good also in terms of grip strength.  Incision is intact with slight amount of scar hypertrophy at the proximal aspect.  Plan at this time is no lifting with that right arm but I do want her to continue with CPM machine for another week and then transition to therapy for continued active assisted range of motion and passive range of motion to achieve full range of motion of that shoulder.  Come back in 4 weeks at which time we will start doing some cuff strengthening.  Follow-Up Instructions: Return in about 4 weeks (around 02/04/2020).   Orders:  No orders of the defined types were placed in this encounter.  No orders of the defined types were placed in this encounter.   Imaging: No results found.  PMFS History: Patient Active Problem List   Diagnosis Date Noted  . Traumatic tear of supraspinatus tendon of right shoulder 11/18/2019  . Tear of right infraspinatus tendon 11/18/2019  . Right carpal tunnel syndrome 11/04/2019  . Acute pain of right shoulder 11/04/2019  . Hypertension 04/12/2019  . Glucose intolerance 04/12/2019  . Chronic venous insufficiency 04/06/2019  . Osteopenia 04/06/2019   Past Medical History:  Diagnosis Date  . Anemia    per patient, had it years ago before  hysterectomy years ago  . Arthritis   . Asthma   . Bronchitis   . Carpal tunnel syndrome of right wrist   . Chronic venous insufficiency   . GERD (gastroesophageal reflux disease)   . History of hiatal hernia   . Hypertension   . Pneumonia   . PONV (postoperative nausea and vomiting)     History reviewed. No pertinent family history.  Past Surgical History:  Procedure Laterality Date  . ABDOMINAL HYSTERECTOMY    . CARPAL TUNNEL RELEASE Right 12/16/2019   Procedure: RIGHT CARPAL TUNNEL RELEASE;  Surgeon: Meredith Pel, MD;  Location: [place];  Service: Orthopedics;  Laterality: Right;  . COLONOSCOPY  06/05/2012   Procedure: COLONOSCOPY;  Surgeon: Rogene Houston, MD;  [facility];  Service: Endoscopy;  Laterality: N/A;  125-changed to 1225 Ann to notify pt  . NISSEN FUNDOPLICATION     x2  . SHOULDER ARTHROSCOPY WITH SUBACROMIAL DECOMPRESSION, ROTATOR CUFF REPAIR AND BICEP TENDON REPAIR Right 12/16/2019   Procedure: RIGHT SHOULDER ARTHROSCOPY, BICEPS TENODESIS, MINI OPEN ROTATOR CUFF TEAR REPAIR, DEBRIDEMENT;  Surgeon: Meredith Pel, MD;  Location: [place];  Service: Orthopedics;  Laterality: Right;   Social History   Occupational History  . Not on file  Tobacco Use  . Smoking status: Never Smoker  .  Smokeless tobacco: Never Used  Vaping Use  . Vaping Use: Never used  Substance and Sexual Activity  . Alcohol use: No  . Drug use: No  . Sexual activity: Not on file

## 2020-01-10 ENCOUNTER — Encounter: Payer: Self-pay | Admitting: Orthopedic Surgery

## 2020-01-10 NOTE — Telephone Encounter (Signed)
Okay to go to rehab 3 times a week plus a home exercise program.  That can be at any pain or here in [place].  [place] I would keep that appointment or keep that vacation time just because I think by then this will be improving.

## 2020-01-12 ENCOUNTER — Ambulatory Visit (HOSPITAL_COMMUNITY): Payer: Medicare Other | Admitting: Specialist

## 2020-01-12 ENCOUNTER — Other Ambulatory Visit: Payer: Self-pay

## 2020-01-12 ENCOUNTER — Encounter (HOSPITAL_COMMUNITY): Payer: Self-pay | Admitting: Specialist

## 2020-01-12 DIAGNOSIS — M25611 Stiffness of right shoulder, not elsewhere classified: Secondary | ICD-10-CM | POA: Diagnosis not present

## 2020-01-12 DIAGNOSIS — M25511 Pain in right shoulder: Secondary | ICD-10-CM

## 2020-01-12 DIAGNOSIS — M25631 Stiffness of right wrist, not elsewhere classified: Secondary | ICD-10-CM

## 2020-01-12 DIAGNOSIS — M25531 Pain in right wrist: Secondary | ICD-10-CM

## 2020-01-12 DIAGNOSIS — R29898 Other symptoms and signs involving the musculoskeletal system: Secondary | ICD-10-CM

## 2020-01-12 NOTE — Therapy (Signed)
[place], [place], [address] Phone: [phone]   Fax:  [phone]  Occupational Therapy Treatment  Patient Details  Name: Jessica [name] MRN: [id number] Date of Birth: [birth year] Referring Provider (OT): Dr. Cheral Bay   Encounter Date: 01/12/2020   OT End of Session - 01/12/20 2114    Visit Number 2    Number of Visits 24    Date for OT Re-Evaluation 03/01/20   mini reassess on 7/21   Authorization Type Medicare and BCBS    Progress Note Due on Visit 10    OT Start Time 1350    OT Stop Time 1430    OT Time Calculation (min) 40 min    Activity Tolerance Patient tolerated treatment well    Behavior During Therapy [facility] for tasks assessed/performed           Past Medical History:  Diagnosis Date  . Anemia    per patient, had it years ago before hysterectomy years ago  . Arthritis   . Asthma   . Bronchitis   . Carpal tunnel syndrome of right wrist   . Chronic venous insufficiency   . GERD (gastroesophageal reflux disease)   . History of hiatal hernia   . Hypertension   . Pneumonia   . PONV (postoperative nausea and vomiting)     Past Surgical History:  Procedure Laterality Date  . ABDOMINAL HYSTERECTOMY    . CARPAL TUNNEL RELEASE Right 12/16/2019   Procedure: RIGHT CARPAL TUNNEL RELEASE;  Surgeon: Meredith Pel, MD;  Location: [place];  Service: Orthopedics;  Laterality: Right;  . COLONOSCOPY  06/05/2012   Procedure: COLONOSCOPY;  Surgeon: Rogene Houston, MD;  [facility];  Service: Endoscopy;  Laterality: N/A;  125-changed to 1225 Ann to notify pt  . NISSEN FUNDOPLICATION     x2  . SHOULDER ARTHROSCOPY WITH SUBACROMIAL DECOMPRESSION, ROTATOR CUFF REPAIR AND BICEP TENDON REPAIR Right 12/16/2019   Procedure: RIGHT SHOULDER ARTHROSCOPY, BICEPS TENODESIS, MINI OPEN ROTATOR CUFF TEAR REPAIR, DEBRIDEMENT;  Surgeon: Meredith Pel, MD;  Location: [place];  Service: Orthopedics;   Laterality: Right;    There were no vitals filed for this visit.   Subjective Assessment - 01/12/20 2112    Subjective  S:  I am not sure I am doing the exercises correctly.  The MD wants me to come to therapy 3 times per week.    Pertinent History note in chart from Dr. Marlou Sa stating 3  times a week is fine.  OT will send updated cert indicating increase in frequency    Special Tests FOTO - complete at next session    Currently in Pain? Yes    Pain Score 3     Pain Location Shoulder    Pain Orientation Right    Pain Descriptors / Indicators Aching              [facility] OT Assessment - 01/12/20 0001      Assessment   Medical Diagnosis S/P R RCR, Biceps Tenodesis, and CTR    Referring Provider (OT) Dr. Cheral Bay    Onset Date/Surgical Date 12/16/19    Hand Dominance Right    Next MD Visit 01/06/20      Precautions   Precautions Shoulder    Type of Shoulder Precautions passive and aa/rom of right shoulder, progress to a/rom on 7/1.  sling until 6/26.  wrist:  progress as tolerated  OT Treatments/Exercises (OP) - 01/12/20 0001      Shoulder Exercises: Supine   External Rotation PROM;5 reps    Internal Rotation PROM;5 reps    Flexion PROM;5 reps    ABduction PROM;5 reps      Shoulder Exercises: Seated   Elevation AROM;10 reps    Extension AROM;10 reps    Row AROM;10 reps      Shoulder Exercises: Therapy Ball   Flexion 15 reps    ABduction 15 reps      Shoulder Exercises: Isometric Strengthening   Flexion Supine;3X3"    Extension Supine;3X3"    External Rotation Supine;3X3"    Internal Rotation Supine;3X3"    ABduction Supine;3X3"    ADduction Supine;3X3"    Other Isometric Exercises elbow flexion and extension 3 X3"      Shoulder Exercises: Stretch   Table Stretch - Flexion 5 reps   standing at mat   Table Stretch - Abduction 5 reps   standing at mat     Manual Therapy   Manual Therapy Myofascial release    Manual therapy  comments manual therapy completed seperately from all other interventions this date    Myofascial Release myofascial release and manual stretching to decrease pain and fascial restrictions and improve pain free mobility in right shoulder, upper arm, scapular, and shoulder region, and wrist region.                    OT Education - 01/12/20 2114    Education Details reviewed table slides and completed in both seated and standing. OT recommended that standing at counter may be more beneficial and more comfortable to complete and patient agreed.    Person(s) Educated Patient    Methods Explanation;Demonstration;Handout    Comprehension Verbalized understanding;Returned demonstration            OT Short Term Goals - 01/12/20 2118      OT SHORT TERM GOAL #1   Title Patient will be educated and independent with HEP for RUE A/ROM and strength.    Time 4    Period Weeks    Status On-going    Target Date 02/02/20      OT SHORT TERM GOAL #2   Title Patient will demonstrate WFL P/ROM in right shoulder, forearm, wrist, and hand in order to improve independence with B/ADLs.    Time 4    Period Weeks    Status On-going      OT SHORT TERM GOAL #3   Title Patient will demonstrate 3+/5 or better strength in her right shoulder, forearm, wrist in order to assist with caring for her mother and complete gardening tasks.    Time 4    Period Weeks    Status On-going      OT SHORT TERM GOAL #4   Title Patient will improve right grip and pinch strength by 50% for improved ability to open jars and containers.    Time 4    Period Weeks    Status On-going      OT SHORT TERM GOAL #5   Title Patient will decrease pain to 3/10 or better in her RUE in order to complete ADLs with less difficulty.    Time 4    Period Weeks    Status On-going             OT Long Term Goals - 01/12/20 2119      OT LONG TERM GOAL #1   Title Patient will  return to prior level of independence using right arm  and hand as dominant with all desired B/IADLs, leisure activities.    Time 8    Period Weeks    Status On-going      OT LONG TERM GOAL #2   Title Patient will improve RUE A/ROM to WNL for improved ability to reach overhead, behind back and use hand to push self out of a chair.    Time 8    Period Weeks    Status On-going      OT LONG TERM GOAL #3   Title Patient will improve right upper extremity strength to 5/5 for improved ability to lift tools and bags of soil when gardening and pick up laundry baskets.    Time 8    Period Weeks    Status On-going      OT LONG TERM GOAL #4   Title Patient will improve right grip strength to 65 pounds and pinch strength to 15 pounds for improved ability to open containers and complete craft projects.    Time 8    Period Weeks    Status On-going      OT LONG TERM GOAL #5   Title Patient will decrease right upper extremity pain to 2/10 or better while completing ADLs, and leisure activities.    Time 8    Period Weeks    Status On-going      Long Term Additional Goals   Additional Long Term Goals Yes                 Plan - 01/12/20 2115    Clinical Impression Statement A:  Patient's carpal tunnel surgical scar is healing well.  Wrist P/ROM is WFL.  Patient reports soreness in right shoulder, patricularly in posterior shoulder with moderate fascial restrictions noted.  Per MD direction, increasing frequency to 3 times per week.  Patient completed towel slides in seated and standing with increased comfort in standing.    Body Structure / Function / Physical Skills ADL;Strength;Pain;UE functional use;ROM;IADL;Fascial restriction;Scar mobility;Wound;Coordination;FMC;Muscle spasms;Skin integrity;Decreased knowledge of precautions    OT Frequency 3x / week    OT Duration 8 weeks    OT Treatment/Interventions Self-care/ADL training;Moist Heat;DME and/or AE instruction;Splinting;Therapeutic activities;Ultrasound;Therapeutic exercise;Scar  mobilization;Passive range of motion;Neuromuscular education;Cryotherapy;Iontophoresis;Electrical Stimulation;Energy conservation;Manual Therapy;Dry needling;Other (comment)    Plan P:  Continue skilled OT intervention to improve P/ROM to [facility] in order to prepare to return to full use of RUE as dominant with daily tasks.  Initiated isometric strengthening with good form.           Patient will benefit from skilled therapeutic intervention in order to improve the following deficits and impairments:   Body Structure / Function / Physical Skills: ADL, Strength, Pain, UE functional use, ROM, IADL, Fascial restriction, Scar mobility, Wound, Coordination, FMC, Muscle spasms, Skin integrity, Decreased knowledge of precautions       Visit Diagnosis: Stiffness of right shoulder, not elsewhere classified - Plan: Ot plan of care cert/re-cert  Stiffness of right wrist, not elsewhere classified - Plan: Ot plan of care cert/re-cert  Acute pain of right shoulder - Plan: Ot plan of care cert/re-cert  Other symptoms and signs involving the musculoskeletal system - Plan: Ot plan of care cert/re-cert  Pain in right wrist - Plan: Ot plan of care cert/re-cert    Problem List Patient Active Problem List   Diagnosis Date Noted  . Traumatic tear of supraspinatus tendon of right shoulder 11/18/2019  . Tear of  right infraspinatus tendon 11/18/2019  . Right carpal tunnel syndrome 11/04/2019  . Acute pain of right shoulder 11/04/2019  . Hypertension 04/12/2019  . Glucose intolerance 04/12/2019  . Chronic venous insufficiency 04/06/2019  . Osteopenia 04/06/2019    Vangie Bicker, [place], OTR/L [phone]  01/12/2020, 9:30 PM  [place] [address] [place], [place], [address] Phone: [phone]   Fax:  [phone]  Name: Jessica [name] MRN: [id number] Date of Birth: Oct 20, 1955

## 2020-01-13 ENCOUNTER — Ambulatory Visit (HOSPITAL_COMMUNITY): Payer: Medicare Other | Attending: Orthopedic Surgery | Admitting: Specialist

## 2020-01-13 ENCOUNTER — Encounter (HOSPITAL_COMMUNITY): Payer: Self-pay | Admitting: Specialist

## 2020-01-13 DIAGNOSIS — M25611 Stiffness of right shoulder, not elsewhere classified: Secondary | ICD-10-CM | POA: Diagnosis present

## 2020-01-13 DIAGNOSIS — M25511 Pain in right shoulder: Secondary | ICD-10-CM | POA: Diagnosis present

## 2020-01-13 DIAGNOSIS — M25531 Pain in right wrist: Secondary | ICD-10-CM | POA: Diagnosis present

## 2020-01-13 DIAGNOSIS — M25631 Stiffness of right wrist, not elsewhere classified: Secondary | ICD-10-CM | POA: Diagnosis present

## 2020-01-13 DIAGNOSIS — R29898 Other symptoms and signs involving the musculoskeletal system: Secondary | ICD-10-CM | POA: Diagnosis present

## 2020-01-13 NOTE — Therapy (Signed)
[place], [place], [address] Phone: [phone]   Fax:  [phone]  Occupational Therapy Treatment  Patient Details  Name: Jessica [name] MRN: [id number] Date of Birth: [birth year] Referring Provider (OT): Dr. Cheral Bay   Encounter Date: 01/13/2020   OT End of Session - 01/13/20 1314    Visit Number 3    Number of Visits 24    Date for OT Re-Evaluation 03/01/20   mini reassess on 7/21   Authorization Type Medicare and BCBS    Progress Note Due on Visit 10    OT Start Time 0905    OT Stop Time 0945    OT Time Calculation (min) 40 min    Activity Tolerance Patient tolerated treatment well           Past Medical History:  Diagnosis Date  . Anemia    per patient, had it years ago before hysterectomy years ago  . Arthritis   . Asthma   . Bronchitis   . Carpal tunnel syndrome of right wrist   . Chronic venous insufficiency   . GERD (gastroesophageal reflux disease)   . History of hiatal hernia   . Hypertension   . Pneumonia   . PONV (postoperative nausea and vomiting)     Past Surgical History:  Procedure Laterality Date  . ABDOMINAL HYSTERECTOMY    . CARPAL TUNNEL RELEASE Right 12/16/2019   Procedure: RIGHT CARPAL TUNNEL RELEASE;  Surgeon: Meredith Pel, MD;  Location: [place];  Service: Orthopedics;  Laterality: Right;  . COLONOSCOPY  06/05/2012   Procedure: COLONOSCOPY;  Surgeon: Rogene Houston, MD;  [facility];  Service: Endoscopy;  Laterality: N/A;  125-changed to 1225 Ann to notify pt  . NISSEN FUNDOPLICATION     x2  . SHOULDER ARTHROSCOPY WITH SUBACROMIAL DECOMPRESSION, ROTATOR CUFF REPAIR AND BICEP TENDON REPAIR Right 12/16/2019   Procedure: RIGHT SHOULDER ARTHROSCOPY, BICEPS TENODESIS, MINI OPEN ROTATOR CUFF TEAR REPAIR, DEBRIDEMENT;  Surgeon: Meredith Pel, MD;  Location: [place];  Service: Orthopedics;  Laterality: Right;    There were no vitals filed for this  visit.   Subjective Assessment - 01/13/20 1252    Subjective  S:  I found a big ball I can do my stretches on.    Currently in Pain? No/denies    Pain Score 3     Pain Location Shoulder              [facility] OT Assessment - 01/13/20 0001      Assessment   Medical Diagnosis S/P R RCR, Biceps Tenodesis, and CTR    Referring Provider (OT) Dr. Cheral Bay      Precautions   Precautions Shoulder    Type of Shoulder Precautions passive and aa/rom of right shoulder, progress to a/rom on 7/1.  sling until 6/26.  wrist:  progress as tolerated                     OT Treatments/Exercises (OP) - 01/13/20 0001      Exercises   Exercises Shoulder;Wrist      Shoulder Exercises: Supine   External Rotation PROM;10 reps    Internal Rotation PROM;10 reps    Flexion PROM;10 reps    ABduction PROM;10 reps    Other Supine Exercises P/ROM elbow flexion and extension 10 times      Shoulder Exercises: Seated   Elevation AROM;12 reps    Extension  AROM;12 reps    Row AROM;12 reps      Shoulder Exercises: Therapy Ball   Flexion 15 reps    ABduction 15 reps      Shoulder Exercises: ROM/Strengthening   Anterior Glide 3 X 5"    Caudal Glide 3 X 5"       Shoulder Exercises: Isometric Strengthening   Flexion Supine;3X5"    Extension Supine;3X5"    External Rotation Supine;3X5"    Internal Rotation Supine;3X5"    ABduction Supine;3X5"    ADduction Supine;3X5"    Other Isometric Exercises elbow flexion and extension 3 X5"       Manual Therapy   Manual Therapy Myofascial release    Manual therapy comments manual therapy completed seperately from all other interventions this date    Myofascial Release myofascial release and manual stretching to decrease pain and fascial restrictions and improve pain free mobility in right shoulder, upper arm, scapular, and shoulder region, and wrist region.                    OT Education - 01/13/20 1314    Education Details  educated on grip and pinch strengthening with red tputty for right hand s/p CTR    Person(s) Educated Patient    Methods Explanation;Demonstration;Handout    Comprehension Verbalized understanding;Returned demonstration            OT Short Term Goals - 01/12/20 2118      OT SHORT TERM GOAL #1   Title Patient will be educated and independent with HEP for RUE A/ROM and strength.    Time 4    Period Weeks    Status On-going    Target Date 02/02/20      OT SHORT TERM GOAL #2   Title Patient will demonstrate WFL P/ROM in right shoulder, forearm, wrist, and hand in order to improve independence with B/ADLs.    Time 4    Period Weeks    Status On-going      OT SHORT TERM GOAL #3   Title Patient will demonstrate 3+/5 or better strength in her right shoulder, forearm, wrist in order to assist with caring for her mother and complete gardening tasks.    Time 4    Period Weeks    Status On-going      OT SHORT TERM GOAL #4   Title Patient will improve right grip and pinch strength by 50% for improved ability to open jars and containers.    Time 4    Period Weeks    Status On-going      OT SHORT TERM GOAL #5   Title Patient will decrease pain to 3/10 or better in her RUE in order to complete ADLs with less difficulty.    Time 4    Period Weeks    Status On-going             OT Long Term Goals - 01/12/20 2119      OT LONG TERM GOAL #1   Title Patient will return to prior level of independence using right arm and hand as dominant with all desired B/IADLs, leisure activities.    Time 8    Period Weeks    Status On-going      OT LONG TERM GOAL #2   Title Patient will improve RUE A/ROM to WNL for improved ability to reach overhead, behind back and use hand to push self out of a chair.    Time 8  Period Weeks    Status On-going      OT LONG TERM GOAL #3   Title Patient will improve right upper extremity strength to 5/5 for improved ability to lift tools and bags of soil  when gardening and pick up laundry baskets.    Time 8    Period Weeks    Status On-going      OT LONG TERM GOAL #4   Title Patient will improve right grip strength to 65 pounds and pinch strength to 15 pounds for improved ability to open containers and complete craft projects.    Time 8    Period Weeks    Status On-going      OT LONG TERM GOAL #5   Title Patient will decrease right upper extremity pain to 2/10 or better while completing ADLs, and leisure activities.    Time 8    Period Weeks    Status On-going      Long Term Additional Goals   Additional Long Term Goals Yes                 Plan - 01/13/20 1316    Clinical Impression Statement A:  improved P/ROM in flexion and abduction this date, increaesed isometric contraction to 5".  added glide exerices with good form. less cuing needed to depress shoulder during therapy ball stretches.    Body Structure / Function / Physical Skills ADL;Strength;Pain;UE functional use;ROM;IADL;Fascial restriction;Scar mobility;Wound;Coordination;FMC;Muscle spasms;Skin integrity;Decreased knowledge of precautions    Plan P:  Continue skilled OT intervention to improve P/ROM to [facility] in order to prepare to return to full use of RUE as dominant with daily tasks.           Patient will benefit from skilled therapeutic intervention in order to improve the following deficits and impairments:   Body Structure / Function / Physical Skills: ADL, Strength, Pain, UE functional use, ROM, IADL, Fascial restriction, Scar mobility, Wound, Coordination, FMC, Muscle spasms, Skin integrity, Decreased knowledge of precautions       Visit Diagnosis: Acute pain of right shoulder    Problem List Patient Active Problem List   Diagnosis Date Noted  . Traumatic tear of supraspinatus tendon of right shoulder 11/18/2019  . Tear of right infraspinatus tendon 11/18/2019  . Right carpal tunnel syndrome 11/04/2019  . Acute pain of right shoulder 11/04/2019    . Hypertension 04/12/2019  . Glucose intolerance 04/12/2019  . Chronic venous insufficiency 04/06/2019  . Osteopenia 04/06/2019    Vangie Bicker, [place], OTR/L [phone]  01/13/2020, 1:22 PM  [place] [address] [place], [place], [address] Phone: [phone]   Fax:  [phone]  Name: Jessica [name] MRN: [id number] Date of Birth: 1955/12/04

## 2020-01-13 NOTE — Patient Instructions (Signed)
Home Exercises Program Theraputty Exercises  Do the following exercises 2 times a day using your affected hand.  1. Roll putty into a ball.  2. Make into a pancake.  3. Roll putty into a roll.  4. Pinch along log with first finger and thumb.   5. Make into a ball.  6. Roll it back into a log.   7. Pinch using thumb and side of first finger.  8. Roll into a ball, then flatten into a pancake.  9. Using your fingers, make putty into a mountain.   

## 2020-01-14 ENCOUNTER — Other Ambulatory Visit: Payer: Self-pay

## 2020-01-14 ENCOUNTER — Ambulatory Visit (HOSPITAL_COMMUNITY): Payer: Medicare Other | Admitting: Specialist

## 2020-01-14 ENCOUNTER — Encounter (HOSPITAL_COMMUNITY): Payer: Self-pay | Admitting: Specialist

## 2020-01-14 DIAGNOSIS — M25511 Pain in right shoulder: Secondary | ICD-10-CM

## 2020-01-14 DIAGNOSIS — M25611 Stiffness of right shoulder, not elsewhere classified: Secondary | ICD-10-CM

## 2020-01-14 NOTE — Therapy (Signed)
[facility] [address] [place], [place], [address] Phone: [phone]   Fax:  [phone]  Occupational Therapy Treatment  Patient Details  Name: Jessica [name] MRN: [id number] Date of Birth: 1955-10-27 Referring Provider (OT): Dr. [place] Grice   Encounter Date: 01/14/2020   OT End of Session - 01/14/20 0954    Visit Number 4    Number of Visits 24    Date for OT Re-Evaluation 03/01/20   mini reassess on 7/21   Authorization Type Medicare and BCBS    OT Start Time 0910    OT Stop Time 0945    OT Time Calculation (min) 35 min    Activity Tolerance Patient tolerated treatment well    Behavior During Therapy [facility] for tasks assessed/performed           Past Medical History:  Diagnosis Date  . Anemia    per patient, had it years ago before hysterectomy years ago  . Arthritis   . Asthma   . Bronchitis   . Carpal tunnel syndrome of right wrist   . Chronic venous insufficiency   . GERD (gastroesophageal reflux disease)   . History of hiatal hernia   . Hypertension   . Pneumonia   . PONV (postoperative nausea and vomiting)     Past Surgical History:  Procedure Laterality Date  . ABDOMINAL HYSTERECTOMY    . CARPAL TUNNEL RELEASE Right 12/16/2019   Procedure: RIGHT CARPAL TUNNEL RELEASE;  Surgeon: Cammy Copa, MD;  Location: [facility] OR;  Service: Orthopedics;  Laterality: Right;  . COLONOSCOPY  06/05/2012   Procedure: COLONOSCOPY;  Surgeon: Malissa Hippo, MD;  Location: [facility];  Service: Endoscopy;  Laterality: N/A;  125-changed to 1225 Ann to notify pt  . NISSEN FUNDOPLICATION     x2  . SHOULDER ARTHROSCOPY WITH SUBACROMIAL DECOMPRESSION, ROTATOR CUFF REPAIR AND BICEP TENDON REPAIR Right 12/16/2019   Procedure: RIGHT SHOULDER ARTHROSCOPY, BICEPS TENODESIS, MINI OPEN ROTATOR CUFF TEAR REPAIR, DEBRIDEMENT;  Surgeon: Cammy Copa, MD;  Location: [facility] OR;  Service: Orthopedics;  Laterality: Right;    There were  no vitals filed for this visit.   Subjective Assessment - 01/14/20 0952    Subjective  S:  The putty irritated my incision a bit.    Currently in Pain? Yes    Pain Score 3     Pain Location Shoulder    Pain Orientation Right    Pain Descriptors / Indicators Aching              [facility] OT Assessment - 01/14/20 0001      Assessment   Medical Diagnosis S/P R RCR, Biceps Tenodesis, and CTR    Referring Provider (OT) Dr. [place] Grice      Precautions   Precautions Shoulder    Type of Shoulder Precautions passive and aa/rom of right shoulder, progress to a/rom on 7/1.  sling until 6/26.  wrist:  progress as tolerated                     OT Treatments/Exercises (OP) - 01/14/20 0001      Exercises   Exercises Shoulder;Wrist      Shoulder Exercises: Supine   External Rotation PROM;10 reps    Internal Rotation PROM;10 reps    Flexion PROM;10 reps    ABduction PROM;10 reps    Other Supine Exercises P/ROM elbow flexion and extension 10 times    Other Supine Exercises  bridging 15 times      Shoulder Exercises: Seated   Elevation AROM;12 reps    Extension AROM;12 reps    Row AROM;12 reps      Shoulder Exercises: Therapy Ball   Flexion 20 reps    ABduction 20 reps      Shoulder Exercises: ROM/Strengthening   Anterior Glide 5X5"    Caudal Glide 5X5"      Shoulder Exercises: Isometric Strengthening   Flexion Supine;3X5"    Extension Supine;3X5"    External Rotation Supine;3X5"    Internal Rotation Supine;3X5"    ABduction Supine;3X5"    ADduction Supine;3X5"    Other Isometric Exercises elbow flexion and extension 3 X5"       Manual Therapy   Manual Therapy Myofascial release    Manual therapy comments manual therapy completed seperately from all other interventions this date    Myofascial Release myofascial release and manual stretching to decrease pain and fascial restrictions and improve pain free mobility in right shoulder, upper arm, scapular, and  shoulder region, and wrist region.                    OT Education - 01/14/20 0953    Education Details issued light resist yellow tputty, as red is too difficult for patient.  also recommended covering in saran wrap or wearing glove to avoid irritating incision area.    Person(s) Educated Patient    Methods Explanation    Comprehension Verbalized understanding;Returned demonstration            OT Short Term Goals - 01/12/20 2118      OT SHORT TERM GOAL #1   Title Patient will be educated and independent with HEP for RUE A/ROM and strength.    Time 4    Period Weeks    Status On-going    Target Date 02/02/20      OT SHORT TERM GOAL #2   Title Patient will demonstrate WFL P/ROM in right shoulder, forearm, wrist, and hand in order to improve independence with B/ADLs.    Time 4    Period Weeks    Status On-going      OT SHORT TERM GOAL #3   Title Patient will demonstrate 3+/5 or better strength in her right shoulder, forearm, wrist in order to assist with caring for her mother and complete gardening tasks.    Time 4    Period Weeks    Status On-going      OT SHORT TERM GOAL #4   Title Patient will improve right grip and pinch strength by 50% for improved ability to open jars and containers.    Time 4    Period Weeks    Status On-going      OT SHORT TERM GOAL #5   Title Patient will decrease pain to 3/10 or better in her RUE in order to complete ADLs with less difficulty.    Time 4    Period Weeks    Status On-going             OT Long Term Goals - 01/12/20 2119      OT LONG TERM GOAL #1   Title Patient will return to prior level of independence using right arm and hand as dominant with all desired B/IADLs, leisure activities.    Time 8    Period Weeks    Status On-going      OT LONG TERM GOAL #2   Title Patient will improve  RUE A/ROM to WNL for improved ability to reach overhead, behind back and use hand to push self out of a chair.    Time 8     Period Weeks    Status On-going      OT LONG TERM GOAL #3   Title Patient will improve right upper extremity strength to 5/5 for improved ability to lift tools and bags of soil when gardening and pick up laundry baskets.    Time 8    Period Weeks    Status On-going      OT LONG TERM GOAL #4   Title Patient will improve right grip strength to 65 pounds and pinch strength to 15 pounds for improved ability to open containers and complete craft projects.    Time 8    Period Weeks    Status On-going      OT LONG TERM GOAL #5   Title Patient will decrease right upper extremity pain to 2/10 or better while completing ADLs, and leisure activities.    Time 8    Period Weeks    Status On-going      Long Term Additional Goals   Additional Long Term Goals Yes                 Plan - 01/14/20 0955    Clinical Impression Statement A:  continued improvement in PROM in supine and flow of movement with other therapeutic exercises.  noted increase in isometric strength in all ranges this date.    Body Structure / Function / Physical Skills ADL;Strength;Pain;UE functional use;ROM;IADL;Fascial restriction;Scar mobility;Wound;Coordination;FMC;Muscle spasms;Skin integrity;Decreased knowledge of precautions    Plan P:  Continue skilled OT intervention, increasing P/ROM to [facility].  add low thumb tacks and prot/ret/elev/dep next session.           Patient will benefit from skilled therapeutic intervention in order to improve the following deficits and impairments:   Body Structure / Function / Physical Skills: ADL, Strength, Pain, UE functional use, ROM, IADL, Fascial restriction, Scar mobility, Wound, Coordination, FMC, Muscle spasms, Skin integrity, Decreased knowledge of precautions       Visit Diagnosis: Acute pain of right shoulder  Stiffness of right shoulder, not elsewhere classified    Problem List Patient Active Problem List   Diagnosis Date Noted  . Traumatic tear of  supraspinatus tendon of right shoulder 11/18/2019  . Tear of right infraspinatus tendon 11/18/2019  . Right carpal tunnel syndrome 11/04/2019  . Acute pain of right shoulder 11/04/2019  . Hypertension 04/12/2019  . Glucose intolerance 04/12/2019  . Chronic venous insufficiency 04/06/2019  . Osteopenia 04/06/2019    Vangie Bicker, [place], OTR/L [phone]  01/14/2020, 11:32 AM  [place], [place], [address] Phone: [phone]   Fax:  [phone]  Name: Jessica [name] MRN: [id number] Date of Birth: 1955/09/15

## 2020-01-19 ENCOUNTER — Encounter (HOSPITAL_COMMUNITY): Payer: Self-pay | Admitting: Specialist

## 2020-01-19 ENCOUNTER — Other Ambulatory Visit: Payer: Self-pay

## 2020-01-19 ENCOUNTER — Ambulatory Visit (HOSPITAL_COMMUNITY): Payer: Medicare Other | Admitting: Specialist

## 2020-01-19 DIAGNOSIS — M25511 Pain in right shoulder: Secondary | ICD-10-CM | POA: Diagnosis not present

## 2020-01-19 DIAGNOSIS — M25611 Stiffness of right shoulder, not elsewhere classified: Secondary | ICD-10-CM

## 2020-01-19 NOTE — Therapy (Signed)
[facility], [place], [address] Phone: [phone]   Fax:  [phone]  Occupational Therapy Treatment  Patient Details  Name: Jessica [name] MRN: [id number] Date of Birth: 01-16-1956 Referring Provider (OT): Dr. Cheral Bay   Encounter Date: 01/19/2020   OT End of Session - 01/19/20 1450    Visit Number 5    Number of Visits 24    Date for OT Re-Evaluation 03/01/20   mini reassess on 7/21   Authorization Type Medicare and BCBS    Progress Note Due on Visit 10    OT Start Time 1352    OT Stop Time 1430    OT Time Calculation (min) 38 min    Activity Tolerance Patient tolerated treatment well    Behavior During Therapy [facility] for tasks assessed/performed           Past Medical History:  Diagnosis Date  . Anemia    per patient, had it years ago before hysterectomy years ago  . Arthritis   . Asthma   . Bronchitis   . Carpal tunnel syndrome of right wrist   . Chronic venous insufficiency   . GERD (gastroesophageal reflux disease)   . History of hiatal hernia   . Hypertension   . Pneumonia   . PONV (postoperative nausea and vomiting)     Past Surgical History:  Procedure Laterality Date  . ABDOMINAL HYSTERECTOMY    . CARPAL TUNNEL RELEASE Right 12/16/2019   Procedure: RIGHT CARPAL TUNNEL RELEASE;  Surgeon: Meredith Pel, MD;  Location: [facility];  Service: Orthopedics;  Laterality: Right;  . COLONOSCOPY  06/05/2012   Procedure: COLONOSCOPY;  Surgeon: Rogene Houston, MD;  Location: [facility];  Service: Endoscopy;  Laterality: N/A;  125-changed to 1225 Ann to notify pt  . NISSEN FUNDOPLICATION     x2  . SHOULDER ARTHROSCOPY WITH SUBACROMIAL DECOMPRESSION, ROTATOR CUFF REPAIR AND BICEP TENDON REPAIR Right 12/16/2019   Procedure: RIGHT SHOULDER ARTHROSCOPY, BICEPS TENODESIS, MINI OPEN ROTATOR CUFF TEAR REPAIR, DEBRIDEMENT;  Surgeon: Meredith Pel, MD;  Location: [facility];  Service: Orthopedics;   Laterality: Right;    There were no vitals filed for this visit.   Subjective Assessment - 01/19/20 1449    Subjective  S:  I have been doing my exercises, I have the most discomfort in my bicep region.    Currently in Pain? Yes    Pain Score 2     Pain Location Arm    Pain Orientation Upper;Right;Medial    Pain Descriptors / Indicators Aching;Burning              [facility] OT Assessment - 01/19/20 0001      Assessment   Medical Diagnosis S/P R RCR, Biceps Tenodesis, and CTR    Referring Provider (OT) Dr. Cheral Bay      Precautions   Precautions Shoulder    Type of Shoulder Precautions passive and aa/rom of right shoulder, progress to a/rom on 7/1.  sling until 6/26.  wrist:  progress as tolerated                     OT Treatments/Exercises (OP) - 01/19/20 0001      Shoulder Exercises: Supine   External Rotation PROM;5 reps;AAROM;10 reps    Internal Rotation PROM;5 reps;AAROM;10 reps    Flexion PROM;5 reps;AAROM;10 reps    ABduction PROM;5 reps;AAROM;10 reps      Shoulder Exercises: Seated  Elevation AROM;15 reps    Extension AROM;15 reps    Row AROM;15 reps    External Rotation AAROM;10 reps    Internal Rotation AAROM;10 reps    Abduction AAROM;10 reps   short arc, elbow flexed to 90 and abd to 90 during exercise     Shoulder Exercises: Therapy Ball   Flexion 20 reps    ABduction 20 reps      Shoulder Exercises: ROM/Strengthening   Thumb Tacks 1 min low range     Prot/Ret//Elev/Dep 1 min low range       Shoulder Exercises: Isometric Strengthening   Flexion Supine;5X5"    Extension Supine;5X5"    External Rotation Supine;5X5"    Internal Rotation Supine;5X5"    ABduction Supine;5X5"    ADduction Supine;5X5"    Other Isometric Exercises elbow flexion and extension 5 X5"       Manual Therapy   Manual Therapy Myofascial release    Manual therapy comments manual therapy completed seperately from all other interventions this date     Myofascial Release myofascial release and manual stretching to decrease pain and fascial restrictions and improve pain free mobility in right shoulder, upper arm, scapular, and shoulder region,                      OT Short Term Goals - 01/12/20 2118      OT SHORT TERM GOAL #1   Title Patient will be educated and independent with HEP for RUE A/ROM and strength.    Time 4    Period Weeks    Status On-going    Target Date 02/02/20      OT SHORT TERM GOAL #2   Title Patient will demonstrate WFL P/ROM in right shoulder, forearm, wrist, and hand in order to improve independence with B/ADLs.    Time 4    Period Weeks    Status On-going      OT SHORT TERM GOAL #3   Title Patient will demonstrate 3+/5 or better strength in her right shoulder, forearm, wrist in order to assist with caring for her mother and complete gardening tasks.    Time 4    Period Weeks    Status On-going      OT SHORT TERM GOAL #4   Title Patient will improve right grip and pinch strength by 50% for improved ability to open jars and containers.    Time 4    Period Weeks    Status On-going      OT SHORT TERM GOAL #5   Title Patient will decrease pain to 3/10 or better in her RUE in order to complete ADLs with less difficulty.    Time 4    Period Weeks    Status On-going             OT Long Term Goals - 01/12/20 2119      OT LONG TERM GOAL #1   Title Patient will return to prior level of independence using right arm and hand as dominant with all desired B/IADLs, leisure activities.    Time 8    Period Weeks    Status On-going      OT LONG TERM GOAL #2   Title Patient will improve RUE A/ROM to WNL for improved ability to reach overhead, behind back and use hand to push self out of a chair.    Time 8    Period Weeks    Status On-going  OT LONG TERM GOAL #3   Title Patient will improve right upper extremity strength to 5/5 for improved ability to lift tools and bags of soil when  gardening and pick up laundry baskets.    Time 8    Period Weeks    Status On-going      OT LONG TERM GOAL #4   Title Patient will improve right grip strength to 65 pounds and pinch strength to 15 pounds for improved ability to open containers and complete craft projects.    Time 8    Period Weeks    Status On-going      OT LONG TERM GOAL #5   Title Patient will decrease right upper extremity pain to 2/10 or better while completing ADLs, and leisure activities.    Time 8    Period Weeks    Status On-going      Long Term Additional Goals   Additional Long Term Goals Yes                 Plan - 01/19/20 1451    Clinical Impression Statement A: Patient demonstrating improved fluidity of movment and less guarding with most exercises this date. As P/ROM is progressing well, initiated aa/rom in supine with dowel rod and therapist assistance.  patient required mod vg and min tactile cues with these new exercises.    Body Structure / Function / Physical Skills ADL;Strength;Pain;UE functional use;ROM;IADL;Fascial restriction;Scar mobility;Wound;Coordination;FMC;Muscle spasms;Skin integrity;Decreased knowledge of precautions    Plan P:  increase to 10 repetitions of aa/rom in supine with less therapist facilitation.  resume manual therapy to carpal tunnel incision.           Patient will benefit from skilled therapeutic intervention in order to improve the following deficits and impairments:   Body Structure / Function / Physical Skills: ADL, Strength, Pain, UE functional use, ROM, IADL, Fascial restriction, Scar mobility, Wound, Coordination, FMC, Muscle spasms, Skin integrity, Decreased knowledge of precautions       Visit Diagnosis: Acute pain of right shoulder  Stiffness of right shoulder, not elsewhere classified    Problem List Patient Active Problem List   Diagnosis Date Noted  . Traumatic tear of supraspinatus tendon of right shoulder 11/18/2019  . Tear of right  infraspinatus tendon 11/18/2019  . Right carpal tunnel syndrome 11/04/2019  . Acute pain of right shoulder 11/04/2019  . Hypertension 04/12/2019  . Glucose intolerance 04/12/2019  . Chronic venous insufficiency 04/06/2019  . Osteopenia 04/06/2019    Vangie Bicker, [place], OTR/L [phone]  01/19/2020, 3:00 PM  [facility] [address] [place], [place], [address] Phone: [phone]   Fax:  [phone]  Name: Jessica [name] MRN: [id number] Date of Birth: April 14, 1956

## 2020-01-20 ENCOUNTER — Ambulatory Visit (HOSPITAL_COMMUNITY): Payer: Medicare Other | Admitting: Occupational Therapy

## 2020-01-20 ENCOUNTER — Encounter (HOSPITAL_COMMUNITY): Payer: Self-pay | Admitting: Occupational Therapy

## 2020-01-20 DIAGNOSIS — M25511 Pain in right shoulder: Secondary | ICD-10-CM | POA: Diagnosis not present

## 2020-01-20 DIAGNOSIS — M25631 Stiffness of right wrist, not elsewhere classified: Secondary | ICD-10-CM

## 2020-01-20 DIAGNOSIS — R29898 Other symptoms and signs involving the musculoskeletal system: Secondary | ICD-10-CM

## 2020-01-20 DIAGNOSIS — M25531 Pain in right wrist: Secondary | ICD-10-CM

## 2020-01-20 DIAGNOSIS — M25611 Stiffness of right shoulder, not elsewhere classified: Secondary | ICD-10-CM

## 2020-01-20 NOTE — Therapy (Signed)
[place], [place], [address] Phone: [phone]   Fax:  [phone]  Occupational Therapy Treatment  Patient Details  Name: Jessica [name] MRN: [id number] Date of Birth: 18-Jan-1956 Referring Provider (OT): Dr. Cheral Bay   Encounter Date: 01/20/2020   OT End of Session - 01/20/20 1106    Visit Number 6    Number of Visits 24    Date for OT Re-Evaluation 03/01/20   mini reassess on 7/21   Authorization Type Medicare and BCBS    Progress Note Due on Visit 10    OT Start Time [phone]    OT Stop Time 0900    OT Time Calculation (min) 39 min    Activity Tolerance Patient tolerated treatment well    Behavior During Therapy [facility] for tasks assessed/performed           Past Medical History:  Diagnosis Date   Anemia    per patient, had it years ago before hysterectomy years ago   Arthritis    Asthma    Bronchitis    Carpal tunnel syndrome of right wrist    Chronic venous insufficiency    GERD (gastroesophageal reflux disease)    History of hiatal hernia    Hypertension    Pneumonia    PONV (postoperative nausea and vomiting)     Past Surgical History:  Procedure Laterality Date   ABDOMINAL HYSTERECTOMY     CARPAL TUNNEL RELEASE Right 12/16/2019   Procedure: RIGHT CARPAL TUNNEL RELEASE;  Surgeon: Meredith Pel, MD;  Location: [place];  Service: Orthopedics;  Laterality: Right;   COLONOSCOPY  06/05/2012   Procedure: COLONOSCOPY;  Surgeon: Rogene Houston, MD;  [facility];  Service: Endoscopy;  Laterality: N/A;  125-changed to 1225 Ann to notify pt   NISSEN FUNDOPLICATION     x2   SHOULDER ARTHROSCOPY WITH SUBACROMIAL DECOMPRESSION, ROTATOR CUFF REPAIR AND BICEP TENDON REPAIR Right 12/16/2019   Procedure: RIGHT SHOULDER ARTHROSCOPY, BICEPS TENODESIS, MINI OPEN ROTATOR CUFF TEAR REPAIR, DEBRIDEMENT;  Surgeon: Meredith Pel, MD;  Location: [place];  Service: Orthopedics;   Laterality: Right;    There were no vitals filed for this visit.   Subjective Assessment - 01/20/20 0823    Subjective  S: I've been having some tenderness in my hand at the scar for the carpal tunnel.    Pertinent History note in chart from Dr. Marlou Sa stating 3  times a week is fine.  OT will send updated cert indicating increase in frequency    Currently in Pain? Yes    Pain Score 3     Pain Location Arm    Pain Orientation Right                        OT Treatments/Exercises (OP) - 01/20/20 0824      Exercises   Exercises Shoulder;Wrist      Shoulder Exercises: Supine   External Rotation PROM;5 reps;AAROM;10 reps   Support at elbow for [facility]   Internal Rotation PROM;5 reps;AAROM;10 reps   Support at elbow for AAROM   Flexion PROM;5 reps;AAROM;10 reps   Support at elbow for AAROM   ABduction PROM;5 reps;AAROM;10 reps   Support at elbow for AAROM     Shoulder Exercises: Seated   Elevation AROM;10 reps    Extension AROM;10 reps    Retraction AROM;10 reps      Shoulder Exercises: Therapy Diona Foley  Flexion 20 reps    ABduction 20 reps      Shoulder Exercises: ROM/Strengthening   Thumb Tacks 15x low level      Shoulder Exercises: Isometric Strengthening   Flexion Supine;5X5"    Extension Supine;5X5"    External Rotation Supine;5X5"    Internal Rotation Supine;5X5"    ABduction Supine;5X5"    ADduction Supine;5X5"    Other Isometric Exercises elbow flexion and extension 5X5"       Manual Therapy   Manual Therapy Myofascial release    Manual therapy comments manual therapy completed seperately from all other interventions this date    Myofascial Release myofascial release and manual stretching to decrease pain and fascial restrictions and improve pain free mobility in right shoulder, upper arm, scapular, and shoulder region, and wrist region.                      OT Short Term Goals - 01/12/20 2118      OT SHORT TERM GOAL #1   Title Patient will  be educated and independent with HEP for RUE A/ROM and strength.    Time 4    Period Weeks    Status On-going    Target Date 02/02/20      OT SHORT TERM GOAL #2   Title Patient will demonstrate WFL P/ROM in right shoulder, forearm, wrist, and hand in order to improve independence with B/ADLs.    Time 4    Period Weeks    Status On-going      OT SHORT TERM GOAL #3   Title Patient will demonstrate 3+/5 or better strength in her right shoulder, forearm, wrist in order to assist with caring for her mother and complete gardening tasks.    Time 4    Period Weeks    Status On-going      OT SHORT TERM GOAL #4   Title Patient will improve right grip and pinch strength by 50% for improved ability to open jars and containers.    Time 4    Period Weeks    Status On-going      OT SHORT TERM GOAL #5   Title Patient will decrease pain to 3/10 or better in her RUE in order to complete ADLs with less difficulty.    Time 4    Period Weeks    Status On-going             OT Long Term Goals - 01/12/20 2119      OT LONG TERM GOAL #1   Title Patient will return to prior level of independence using right arm and hand as dominant with all desired B/IADLs, leisure activities.    Time 8    Period Weeks    Status On-going      OT LONG TERM GOAL #2   Title Patient will improve RUE A/ROM to WNL for improved ability to reach overhead, behind back and use hand to push self out of a chair.    Time 8    Period Weeks    Status On-going      OT LONG TERM GOAL #3   Title Patient will improve right upper extremity strength to 5/5 for improved ability to lift tools and bags of soil when gardening and pick up laundry baskets.    Time 8    Period Weeks    Status On-going      OT LONG TERM GOAL #4   Title Patient will improve right  grip strength to 65 pounds and pinch strength to 15 pounds for improved ability to open containers and complete craft projects.    Time 8    Period Weeks    Status  On-going      OT LONG TERM GOAL #5   Title Patient will decrease right upper extremity pain to 2/10 or better while completing ADLs, and leisure activities.    Time 8    Period Weeks    Status On-going      Long Term Additional Goals   Additional Long Term Goals Yes                 Plan - 01/20/20 1106    Clinical Impression Statement A: Starting session with myofascial release and PROM. Patient demonstrating less guarding with most exercises this date. Contiued aa/rom in supine with dowel rod and therapist assistance. Continued proximal strengthening with isometrics in supine and thumb tacks. Stretching with blue therapy ball. Ending session with manual therapy at wrist to decrease pain and scare tissue. providing cues for form and tehcnique throughout.    Body Structure / Function / Physical Skills ADL;Strength;Pain;UE functional use;ROM;IADL;Fascial restriction;Scar mobility;Wound;Coordination;FMC;Muscle spasms;Skin integrity;Decreased knowledge of precautions    Plan P: Continue myofascial release, PROM, and AAROM with dowel. Progress to isometrics in standing and provide hand out. Continue manual thearpy at carpal tunnel incision.           Patient will benefit from skilled therapeutic intervention in order to improve the following deficits and impairments:   Body Structure / Function / Physical Skills: ADL, Strength, Pain, UE functional use, ROM, IADL, Fascial restriction, Scar mobility, Wound, Coordination, FMC, Muscle spasms, Skin integrity, Decreased knowledge of precautions       Visit Diagnosis: Acute pain of right shoulder  Stiffness of right shoulder, not elsewhere classified  Stiffness of right wrist, not elsewhere classified  Other symptoms and signs involving the musculoskeletal system  Pain in right wrist    Problem List Patient Active Problem List   Diagnosis Date Noted   Traumatic tear of supraspinatus tendon of right shoulder 11/18/2019   Tear  of right infraspinatus tendon 11/18/2019   Right carpal tunnel syndrome 11/04/2019   Acute pain of right shoulder 11/04/2019   Hypertension 04/12/2019   Glucose intolerance 04/12/2019   Chronic venous insufficiency 04/06/2019   Osteopenia 04/06/2019    Neal Dy, MSOT, OTR/L 01/20/2020, 11:10 AM  [place], [place], [address] Phone: [phone]   Fax:  [phone]  Name: MAHREEN SCHEWE MRN: [id number] Date of Birth: 04-27-1956

## 2020-01-21 ENCOUNTER — Ambulatory Visit (HOSPITAL_COMMUNITY): Payer: Medicare Other | Admitting: Occupational Therapy

## 2020-01-21 ENCOUNTER — Other Ambulatory Visit: Payer: Self-pay

## 2020-01-21 DIAGNOSIS — M25531 Pain in right wrist: Secondary | ICD-10-CM

## 2020-01-21 DIAGNOSIS — R29898 Other symptoms and signs involving the musculoskeletal system: Secondary | ICD-10-CM

## 2020-01-21 DIAGNOSIS — M25511 Pain in right shoulder: Secondary | ICD-10-CM | POA: Diagnosis not present

## 2020-01-21 DIAGNOSIS — M25631 Stiffness of right wrist, not elsewhere classified: Secondary | ICD-10-CM

## 2020-01-21 DIAGNOSIS — M25611 Stiffness of right shoulder, not elsewhere classified: Secondary | ICD-10-CM

## 2020-01-21 NOTE — Patient Instructions (Signed)
Strengthening: Isometric Flexion  Using wall for resistance, press right fist into ball using light pressure. Hold __5__ seconds. Repeat __5__ times per set. Do __1__ sets per session. Do ____ sessions per day.  SHOULDER: Abduction (Isometric)  Use wall as resistance. Press arm against pillow. Keep elbow straight. Hold _5__ seconds. _5__ reps per set, _1__ sets per day, __2_ days per week  Extension (Isometric)  Place left bent elbow and back of arm against wall. Press elbow against wall. Hold __5__ seconds. Repeat _5___ times. Do __2_ sessions per day.  Internal Rotation (Isometric)  Place palm of right fist against door frame, with elbow bent. Press fist against door frame. Hold ____ seconds. Repeat __5__ times. Do __2__ sessions per day.  External Rotation (Isometric)  Place back of left fist against door frame, with elbow bent. Press fist against door frame. Hold __5__ seconds. Repeat __5__ times. Do __2__ sessions per day.  Copyright  VHI. All rights reserved.

## 2020-01-21 NOTE — Therapy (Signed)
[place], [place], [address] Phone: [phone]   Fax:  [phone]  Occupational Therapy Treatment  Patient Details  Name: Jessica [name] MRN: [id number] Date of Birth: [birth year] Referring Provider (OT): Dr. Cheral Bay   Encounter Date: 01/21/2020   OT End of Session - 01/21/20 1641    Visit Number 7    Number of Visits 24    Date for OT Re-Evaluation 03/01/20   mini reassess on 7/21   Authorization Type Medicare and BCBS    Progress Note Due on Visit 10    OT Start Time 1435    OT Stop Time 1514    OT Time Calculation (min) 39 min    Activity Tolerance Patient tolerated treatment well    Behavior During Therapy [facility] for tasks assessed/performed           Past Medical History:  Diagnosis Date  . Anemia    per patient, had it years ago before hysterectomy years ago  . Arthritis   . Asthma   . Bronchitis   . Carpal tunnel syndrome of right wrist   . Chronic venous insufficiency   . GERD (gastroesophageal reflux disease)   . History of hiatal hernia   . Hypertension   . Pneumonia   . PONV (postoperative nausea and vomiting)     Past Surgical History:  Procedure Laterality Date  . ABDOMINAL HYSTERECTOMY    . CARPAL TUNNEL RELEASE Right 12/16/2019   Procedure: RIGHT CARPAL TUNNEL RELEASE;  Surgeon: Meredith Pel, MD;  Location: [place];  Service: Orthopedics;  Laterality: Right;  . COLONOSCOPY  06/05/2012   Procedure: COLONOSCOPY;  Surgeon: Rogene Houston, MD;  [facility];  Service: Endoscopy;  Laterality: N/A;  125-changed to 1225 Ann to notify pt  . NISSEN FUNDOPLICATION     x2  . SHOULDER ARTHROSCOPY WITH SUBACROMIAL DECOMPRESSION, ROTATOR CUFF REPAIR AND BICEP TENDON REPAIR Right 12/16/2019   Procedure: RIGHT SHOULDER ARTHROSCOPY, BICEPS TENODESIS, MINI OPEN ROTATOR CUFF TEAR REPAIR, DEBRIDEMENT;  Surgeon: Meredith Pel, MD;  Location: [place];  Service: Orthopedics;   Laterality: Right;    There were no vitals filed for this visit.   Subjective Assessment - 01/21/20 1437    Subjective  S: I slept the longest I have slept since surgery last night    Currently in Pain? Yes    Pain Score 4     Pain Location Arm              [facility] OT Assessment - 01/21/20 1440      Assessment   Medical Diagnosis S/P R RCR, Biceps Tenodesis, and CTR    Referring Provider (OT) Dr. Cheral Bay      Precautions   Precautions Shoulder    Type of Shoulder Precautions passive and aa/rom of right shoulder, progress to a/rom on 7/1.  sling until 6/26.  wrist:  progress as tolerated                     OT Treatments/Exercises (OP) - 01/21/20 1450      Exercises   Exercises Shoulder;Wrist      Shoulder Exercises: Supine   External Rotation PROM;5 reps;AAROM;10 reps   dowel rod   External Rotation Limitations Support at elbow    Internal Rotation PROM;5 reps;AAROM;10 reps   dowel rod   Internal Rotation Limitations Support at elbow    Flexion PROM;5 reps;AAROM;10 reps  dowel rod   ABduction PROM;5 reps;AAROM;10 reps   dowel rod   Other Supine Exercises AAROM chest press; dowel rod. 10x.      Shoulder Exercises: Pulleys   Flexion Other (comment)   10x   ABduction Other (comment)   10x     Shoulder Exercises: Therapy Ball   Flexion 20 reps    ABduction 20 reps      Shoulder Exercises: Isometric Strengthening   Flexion 5X5"    Extension 5X5"    External Rotation 5X5"    Internal Rotation 5X5"    ABduction 5X5"      Manual Therapy   Manual Therapy Myofascial release    Manual therapy comments manual therapy completed seperately from all other interventions this date    Myofascial Release myofascial release and manual stretching to decrease pain and fascial restrictions and improve pain free mobility in right shoulder, upper arm, scapular, and shoulder region, and wrist region.                    OT Education - 01/21/20 1640     Education Details Providing education and handout for isometrics    Person(s) Educated Patient    Methods Explanation;Handout;Demonstration    Comprehension Verbalized understanding;Returned demonstration            OT Short Term Goals - 01/12/20 2118      OT SHORT TERM GOAL #1   Title Patient will be educated and independent with HEP for RUE A/ROM and strength.    Time 4    Period Weeks    Status On-going    Target Date 02/02/20      OT SHORT TERM GOAL #2   Title Patient will demonstrate WFL P/ROM in right shoulder, forearm, wrist, and hand in order to improve independence with B/ADLs.    Time 4    Period Weeks    Status On-going      OT SHORT TERM GOAL #3   Title Patient will demonstrate 3+/5 or better strength in her right shoulder, forearm, wrist in order to assist with caring for her mother and complete gardening tasks.    Time 4    Period Weeks    Status On-going      OT SHORT TERM GOAL #4   Title Patient will improve right grip and pinch strength by 50% for improved ability to open jars and containers.    Time 4    Period Weeks    Status On-going      OT SHORT TERM GOAL #5   Title Patient will decrease pain to 3/10 or better in her RUE in order to complete ADLs with less difficulty.    Time 4    Period Weeks    Status On-going             OT Long Term Goals - 01/12/20 2119      OT LONG TERM GOAL #1   Title Patient will return to prior level of independence using right arm and hand as dominant with all desired B/IADLs, leisure activities.    Time 8    Period Weeks    Status On-going      OT LONG TERM GOAL #2   Title Patient will improve RUE A/ROM to WNL for improved ability to reach overhead, behind back and use hand to push self out of a chair.    Time 8    Period Weeks    Status On-going  OT LONG TERM GOAL #3   Title Patient will improve right upper extremity strength to 5/5 for improved ability to lift tools and bags of soil when gardening  and pick up laundry baskets.    Time 8    Period Weeks    Status On-going      OT LONG TERM GOAL #4   Title Patient will improve right grip strength to 65 pounds and pinch strength to 15 pounds for improved ability to open containers and complete craft projects.    Time 8    Period Weeks    Status On-going      OT LONG TERM GOAL #5   Title Patient will decrease right upper extremity pain to 2/10 or better while completing ADLs, and leisure activities.    Time 8    Period Weeks    Status On-going      Long Term Additional Goals   Additional Long Term Goals Yes                 Plan - 01/21/20 1641    Clinical Impression Statement A: Starting session with myofascial release and PROM. Patient demonstrating increased tolerance of AAROM with dowel rod requiring less support from therapist. Initated proximal strengthening with isometrics in standing; provided handout for adding to HEP. Stretching with blue therapy ball and pulleys. Providing cues for form and technique throughout.    Body Structure / Function / Physical Skills ADL;Strength;Pain;UE functional use;ROM;IADL;Fascial restriction;Scar mobility;Wound;Coordination;FMC;Muscle spasms;Skin integrity;Decreased knowledge of precautions    Plan P: Continue myofascial release, PROM, and AAROM with dowel. Progress to isometrics in standing and provide hand out. Continue manual thearpy at carpal tunnel incision. Theraputty for hand?    OT Home Exercise Plan inital evaluation:  table slides, tendon glides. 7/9 isometrics.           Patient will benefit from skilled therapeutic intervention in order to improve the following deficits and impairments:   Body Structure / Function / Physical Skills: ADL, Strength, Pain, UE functional use, ROM, IADL, Fascial restriction, Scar mobility, Wound, Coordination, FMC, Muscle spasms, Skin integrity, Decreased knowledge of precautions       Visit Diagnosis: Acute pain of right  shoulder  Stiffness of right shoulder, not elsewhere classified  Stiffness of right wrist, not elsewhere classified  Other symptoms and signs involving the musculoskeletal system  Pain in right wrist    Problem List Patient Active Problem List   Diagnosis Date Noted  . Traumatic tear of supraspinatus tendon of right shoulder 11/18/2019  . Tear of right infraspinatus tendon 11/18/2019  . Right carpal tunnel syndrome 11/04/2019  . Acute pain of right shoulder 11/04/2019  . Hypertension 04/12/2019  . Glucose intolerance 04/12/2019  . Chronic venous insufficiency 04/06/2019  . Osteopenia 04/06/2019    Neal Dy, MSOT, OTR/L 01/21/2020, 4:45 PM  [place] [address] [place], [place], [address] Phone: [phone]   Fax:  [phone]  Name: ALLESANDRA HUEBSCH MRN: [id number] Date of Birth: 11-02-1955

## 2020-01-24 ENCOUNTER — Other Ambulatory Visit: Payer: Self-pay

## 2020-01-24 ENCOUNTER — Ambulatory Visit (HOSPITAL_COMMUNITY): Payer: Medicare Other | Admitting: Specialist

## 2020-01-24 ENCOUNTER — Encounter (HOSPITAL_COMMUNITY): Payer: Self-pay | Admitting: Specialist

## 2020-01-24 DIAGNOSIS — M25511 Pain in right shoulder: Secondary | ICD-10-CM | POA: Diagnosis not present

## 2020-01-24 DIAGNOSIS — M25611 Stiffness of right shoulder, not elsewhere classified: Secondary | ICD-10-CM

## 2020-01-24 DIAGNOSIS — M25631 Stiffness of right wrist, not elsewhere classified: Secondary | ICD-10-CM

## 2020-01-24 NOTE — Therapy (Signed)
Jessica [name], [place], [address] Phone: [phone]   Fax:  [phone]  Occupational Therapy Treatment  Patient Details  Name: Jessica [name] MRN: [id number] Date of Birth: [birth year] Referring Provider (OT): Dr. Cheral Bay   Encounter Date: 01/24/2020   OT End of Session - 01/24/20 1224    Visit Number 8    Number of Visits 24    Date for OT Re-Evaluation 03/01/20   mini reassess on 7/21   Authorization Type Medicare and BCBS    Progress Note Due on Visit 10    OT Start Time 1120    OT Stop Time 1205    OT Time Calculation (min) 45 min    Activity Tolerance Patient tolerated treatment well    Behavior During Therapy [facility] for tasks assessed/performed           Past Medical History:  Diagnosis Date  . Anemia    per patient, had it years ago before hysterectomy years ago  . Arthritis   . Asthma   . Bronchitis   . Carpal tunnel syndrome of right wrist   . Chronic venous insufficiency   . GERD (gastroesophageal reflux disease)   . History of hiatal hernia   . Hypertension   . Pneumonia   . PONV (postoperative nausea and vomiting)     Past Surgical History:  Procedure Laterality Date  . ABDOMINAL HYSTERECTOMY    . CARPAL TUNNEL RELEASE Right 12/16/2019   Procedure: RIGHT CARPAL TUNNEL RELEASE;  Surgeon: Meredith Pel, MD;  Location: [place];  Service: Orthopedics;  Laterality: Right;  . COLONOSCOPY  06/05/2012   Procedure: COLONOSCOPY;  Surgeon: Rogene Houston, MD;  [facility];  Service: Endoscopy;  Laterality: N/A;  125-changed to 1225 [name] to notify pt  . NISSEN FUNDOPLICATION     x2  . SHOULDER ARTHROSCOPY WITH SUBACROMIAL DECOMPRESSION, ROTATOR CUFF REPAIR AND BICEP TENDON REPAIR Right 12/16/2019   Procedure: RIGHT SHOULDER ARTHROSCOPY, BICEPS TENODESIS, MINI OPEN ROTATOR CUFF TEAR REPAIR, DEBRIDEMENT;  Surgeon: Meredith Pel, MD;  Location: [place];  Service: Orthopedics;   Laterality: Right;    There were no vitals filed for this visit.   Subjective Assessment - 01/24/20 1222    Subjective  S:  I did my exercises over the weekend, and some piddling around the house.    Currently in Pain? Yes    Pain Score 3     Pain Location Arm    Pain Orientation Right    Pain Descriptors / Indicators Aching;Burning              [facility] OT Assessment - 01/24/20 0001      Assessment   Medical Diagnosis S/P R RCR, Biceps Tenodesis, and CTR    Referring Provider (OT) Dr. Cheral Bay      Precautions   Precautions Shoulder    Type of Shoulder Precautions passive and aa/rom of right shoulder, progress to a/rom on 7/1.  sling until 6/26.  wrist:  progress as tolerated                     OT Treatments/Exercises (OP) - 01/24/20 0001      Exercises   Exercises Shoulder;Wrist      Shoulder Exercises: Supine   Protraction PROM;AAROM;10 reps    Horizontal ABduction PROM;AAROM;10 reps    External Rotation PROM;AAROM;10 reps    Internal Rotation PROM;AAROM;10 reps  Flexion PROM;AAROM;10 reps    ABduction PROM;AAROM;10 reps      Shoulder Exercises: Seated   Elevation AROM;15 reps    Extension AROM;15 reps    Row AROM;15 reps      Shoulder Exercises: Standing   Protraction AAROM;10 reps    Protraction Limitations max tactile cues from therapist to depress shoulder    External Rotation AAROM;10 reps    Internal Rotation AAROM;10 reps    Flexion AAROM;10 reps    Flexion Limitations max tactile cues to depress shoulder    ABduction AAROM;10 reps      Shoulder Exercises: Pulleys   Flexion 1 minute    ABduction 1 minute      Shoulder Exercises: Therapy Ball   Flexion 10 reps    ABduction 10 reps      Shoulder Exercises: ROM/Strengthening   Thumb Tacks 1 min low range as unable to reach to shoulder height    Prot/Ret//Elev/Dep 1 min low range       Wrist Exercises   Wrist Flexion PROM;10 reps    Wrist Extension PROM;10 reps    Wrist  Radial Deviation PROM;10 reps    Wrist Ulnar Deviation PROM;10 reps      Manual Therapy   Manual Therapy Myofascial release    Manual therapy comments manual therapy completed seperately from all other interventions this date    Myofascial Release myofascial release and manual stretching to decrease pain and fascial restrictions and improve pain free mobility in right shoulder, upper arm, scapular, and shoulder region, and wrist region.                    OT Education - 01/24/20 1223    Education Details encouraged patient to complete HEP in front of mirror to allow her to see if she is elevating her shoulder during exercises.    Person(s) Educated Patient    Methods Explanation    Comprehension Verbalized understanding            OT Short Term Goals - 01/12/20 2118      OT SHORT TERM GOAL #1   Title Patient will be educated and independent with HEP for RUE A/ROM and strength.    Time 4    Period Weeks    Status On-going    Target Date 02/02/20      OT SHORT TERM GOAL #2   Title Patient will demonstrate WFL P/ROM in right shoulder, forearm, wrist, and hand in order to improve independence with B/ADLs.    Time 4    Period Weeks    Status On-going      OT SHORT TERM GOAL #3   Title Patient will demonstrate 3+/5 or better strength in her right shoulder, forearm, wrist in order to assist with caring for her mother and complete gardening tasks.    Time 4    Period Weeks    Status On-going      OT SHORT TERM GOAL #4   Title Patient will improve right grip and pinch strength by 50% for improved ability to open jars and containers.    Time 4    Period Weeks    Status On-going      OT SHORT TERM GOAL #5   Title Patient will decrease pain to 3/10 or better in her RUE in order to complete ADLs with less difficulty.    Time 4    Period Weeks    Status On-going  OT Long Term Goals - 01/12/20 2119      OT LONG TERM GOAL #1   Title Patient will return  to prior level of independence using right arm and hand as dominant with all desired B/IADLs, leisure activities.    Time 8    Period Weeks    Status On-going      OT LONG TERM GOAL #2   Title Patient will improve RUE A/ROM to WNL for improved ability to reach overhead, behind back and use hand to push self out of a chair.    Time 8    Period Weeks    Status On-going      OT LONG TERM GOAL #3   Title Patient will improve right upper extremity strength to 5/5 for improved ability to lift tools and bags of soil when gardening and pick up laundry baskets.    Time 8    Period Weeks    Status On-going      OT LONG TERM GOAL #4   Title Patient will improve right grip strength to 65 pounds and pinch strength to 15 pounds for improved ability to open containers and complete craft projects.    Time 8    Period Weeks    Status On-going      OT LONG TERM GOAL #5   Title Patient will decrease right upper extremity pain to 2/10 or better while completing ADLs, and leisure activities.    Time 8    Period Weeks    Status On-going      Long Term Additional Goals   Additional Long Term Goals Yes                 Plan - 01/24/20 1224    Clinical Impression Statement A:  Patient with less fascial restrictions in carpal tunnel and shoulder region this date, good response from [place].  Completed standing aa/rom with patient elevating shoulder during exercises.  therapist providing max vg and tactile cues to depress shoulder, encouraged patient to be cognizant of depressing shoulder during exercises and functional tasks at home.    Body Structure / Function / Physical Skills ADL;Strength;Pain;UE functional use;ROM;IADL;Fascial restriction;Scar mobility;Wound;Coordination;FMC;Muscle spasms;Skin integrity;Decreased knowledge of precautions    Plan P:  continue to decrease elevation of shoulder during functional activities and therapeutic exercises.  add isometrics in standing., work on proximal  shoulder strength in prep for functional reaching and other ther ex.           Patient will benefit from skilled therapeutic intervention in order to improve the following deficits and impairments:   Body Structure / Function / Physical Skills: ADL, Strength, Pain, UE functional use, ROM, IADL, Fascial restriction, Scar mobility, Wound, Coordination, FMC, Muscle spasms, Skin integrity, Decreased knowledge of precautions       Visit Diagnosis: Acute pain of right shoulder  Stiffness of right shoulder, not elsewhere classified  Stiffness of right wrist, not elsewhere classified    Problem List Patient Active Problem List   Diagnosis Date Noted  . Traumatic tear of supraspinatus tendon of right shoulder 11/18/2019  . Tear of right infraspinatus tendon 11/18/2019  . Right carpal tunnel syndrome 11/04/2019  . Acute pain of right shoulder 11/04/2019  . Hypertension 04/12/2019  . Glucose intolerance 04/12/2019  . Chronic venous insufficiency 04/06/2019  . Osteopenia 04/06/2019    Vangie Bicker, [place], OTR/L [phone]  01/24/2020, 12:32 PM  [place] [address] [place], [place], [address] Phone:  [phone]   Fax:  [phone]  Name: Jessica [name] MRN: [id number] Date of Birth: 06-Apr-1956

## 2020-01-26 ENCOUNTER — Ambulatory Visit (HOSPITAL_COMMUNITY): Payer: Medicare Other | Admitting: Specialist

## 2020-01-26 ENCOUNTER — Other Ambulatory Visit: Payer: Self-pay

## 2020-01-26 DIAGNOSIS — M25511 Pain in right shoulder: Secondary | ICD-10-CM | POA: Diagnosis not present

## 2020-01-26 DIAGNOSIS — M25631 Stiffness of right wrist, not elsewhere classified: Secondary | ICD-10-CM

## 2020-01-26 DIAGNOSIS — M25611 Stiffness of right shoulder, not elsewhere classified: Secondary | ICD-10-CM

## 2020-01-26 NOTE — Therapy (Signed)
[place], [place], [address] Phone: [phone]   Fax:  [phone]  Occupational Therapy Treatment  Patient Details  Name: Jessica [name] MRN: [id number] Date of Birth: 17-Dec-1955 Referring Provider (OT): Dr. Cheral Bay   Encounter Date: 01/26/2020   OT End of Session - 01/26/20 2244    Visit Number 9    Number of Visits 24    Date for OT Re-Evaluation 03/01/20   mini reassess on 7/21   Authorization Type Medicare and BCBS    OT Start Time 1120    OT Stop Time 1205    OT Time Calculation (min) 45 min    Activity Tolerance Patient tolerated treatment well    Behavior During Therapy [facility] for tasks assessed/performed           Past Medical History:  Diagnosis Date  . Anemia    per patient, had it years ago before hysterectomy years ago  . Arthritis   . Asthma   . Bronchitis   . Carpal tunnel syndrome of right wrist   . Chronic venous insufficiency   . GERD (gastroesophageal reflux disease)   . History of hiatal hernia   . Hypertension   . Pneumonia   . PONV (postoperative nausea and vomiting)     Past Surgical History:  Procedure Laterality Date  . ABDOMINAL HYSTERECTOMY    . CARPAL TUNNEL RELEASE Right 12/16/2019   Procedure: RIGHT CARPAL TUNNEL RELEASE;  Surgeon: Meredith Pel, MD;  Location: [place];  Service: Orthopedics;  Laterality: Right;  . COLONOSCOPY  06/05/2012   Procedure: COLONOSCOPY;  Surgeon: Rogene Houston, MD;  [facility];  Service: Endoscopy;  Laterality: N/A;  125-changed to 1225 Ann to notify pt  . NISSEN FUNDOPLICATION     x2  . SHOULDER ARTHROSCOPY WITH SUBACROMIAL DECOMPRESSION, ROTATOR CUFF REPAIR AND BICEP TENDON REPAIR Right 12/16/2019   Procedure: RIGHT SHOULDER ARTHROSCOPY, BICEPS TENODESIS, MINI OPEN ROTATOR CUFF TEAR REPAIR, DEBRIDEMENT;  Surgeon: Meredith Pel, MD;  Location: [place];  Service: Orthopedics;  Laterality: Right;    There  were no vitals filed for this visit.   Subjective Assessment - 01/26/20 2242    Subjective  S: the putty still seems to irritate my incision    Currently in Pain? Yes    Pain Score 3     Pain Location Shoulder    Pain Orientation Right              [facility] OT Assessment - 01/26/20 0001      Assessment   Medical Diagnosis S/P R RCR, Biceps Tenodesis, and CTR    Referring Provider (OT) Dr. Cheral Bay      Precautions   Precautions Shoulder    Type of Shoulder Precautions passive and aa/rom of right shoulder, progress to a/rom on 7/1.  sling until 6/26.  wrist:  progress as tolerated                     OT Treatments/Exercises (OP) - 01/26/20 0001      Exercises   Exercises Shoulder      Shoulder Exercises: Supine   Protraction PROM;AAROM;10 reps    Horizontal ABduction PROM;AAROM;10 reps    External Rotation PROM;AAROM;10 reps    Internal Rotation PROM;AAROM;10 reps    Flexion PROM;AAROM;10 reps      Shoulder Exercises: Seated   Elevation AROM;15 reps    Extension AROM;15 reps  Row AROM;15 reps      Shoulder Exercises: Pulleys   Flexion 2 minutes    ABduction 2 minutes      Manual Therapy   Manual Therapy Myofascial release    Manual therapy comments manual therapy completed seperately from all other interventions this date    Myofascial Release myofascial release and manual stretching to decrease pain and fascial restrictions and improve pain free mobility in right shoulder, upper arm, scapular, and shoulder region, and wrist region.                    OT Education - 01/26/20 2243    Education Details recommended patient not flatten putty to avoid increased pressure in carpal tunnel region.  patient agreeable to this switch.    Person(s) Educated Patient    Methods Explanation    Comprehension Verbalized understanding            OT Short Term Goals - 01/12/20 2118      OT SHORT TERM GOAL #1   Title Patient will be educated  and independent with HEP for RUE A/ROM and strength.    Time 4    Period Weeks    Status On-going    Target Date 02/02/20      OT SHORT TERM GOAL #2   Title Patient will demonstrate WFL P/ROM in right shoulder, forearm, wrist, and hand in order to improve independence with B/ADLs.    Time 4    Period Weeks    Status On-going      OT SHORT TERM GOAL #3   Title Patient will demonstrate 3+/5 or better strength in her right shoulder, forearm, wrist in order to assist with caring for her mother and complete gardening tasks.    Time 4    Period Weeks    Status On-going      OT SHORT TERM GOAL #4   Title Patient will improve right grip and pinch strength by 50% for improved ability to open jars and containers.    Time 4    Period Weeks    Status On-going      OT SHORT TERM GOAL #5   Title Patient will decrease pain to 3/10 or better in her RUE in order to complete ADLs with less difficulty.    Time 4    Period Weeks    Status On-going             OT Long Term Goals - 01/12/20 2119      OT LONG TERM GOAL #1   Title Patient will return to prior level of independence using right arm and hand as dominant with all desired B/IADLs, leisure activities.    Time 8    Period Weeks    Status On-going      OT LONG TERM GOAL #2   Title Patient will improve RUE A/ROM to WNL for improved ability to reach overhead, behind back and use hand to push self out of a chair.    Time 8    Period Weeks    Status On-going      OT LONG TERM GOAL #3   Title Patient will improve right upper extremity strength to 5/5 for improved ability to lift tools and bags of soil when gardening and pick up laundry baskets.    Time 8    Period Weeks    Status On-going      OT LONG TERM GOAL #4   Title Patient will improve  right grip strength to 65 pounds and pinch strength to 15 pounds for improved ability to open containers and complete craft projects.    Time 8    Period Weeks    Status On-going      OT  LONG TERM GOAL #5   Title Patient will decrease right upper extremity pain to 2/10 or better while completing ADLs, and leisure activities.    Time 8    Period Weeks    Status On-going      Long Term Additional Goals   Additional Long Term Goals Yes                 Plan - 01/26/20 2245    Clinical Impression Statement A:  patient with marked improvement in positioning iwth HEP, now shoulder elevation with ther ex present this date.  Able to complete aa/rom in supine and added to HEP this date    Plan P:  aa/rom in seated, ball circles with less facilitation as range of motion improves.           Patient will benefit from skilled therapeutic intervention in order to improve the following deficits and impairments:           Visit Diagnosis: Acute pain of right shoulder  Stiffness of right shoulder, not elsewhere classified  Stiffness of right wrist, not elsewhere classified    Problem List Patient Active Problem List   Diagnosis Date Noted  . Traumatic tear of supraspinatus tendon of right shoulder 11/18/2019  . Tear of right infraspinatus tendon 11/18/2019  . Right carpal tunnel syndrome 11/04/2019  . Acute pain of right shoulder 11/04/2019  . Hypertension 04/12/2019  . Glucose intolerance 04/12/2019  . Chronic venous insufficiency 04/06/2019  . Osteopenia 04/06/2019    [facility] 01/26/2020,  Vangie Bicker, [place], OTR/L [facility] [address] [place], [place], [address] Phone: [phone]   Fax:  [phone]  Name: Jessica [name] MRN: [id number] Date of Birth: [birth year]

## 2020-01-26 NOTE — Patient Instructions (Signed)
  Complete the following 1-2 a day. Hold for 10-15 seconds. Complete 3 times each.   1) SHOULDER - ISOMETRIC FLEXION  Gently push your fist forward into a wall with your elbow bent.    2) SHOULDER - ISOMETRIC EXTENSION  Gently push your a bent elbow back into a wall.    3) SHOULDER - ISOMETRIC INTERNAL ROTATION   Gently press your hand into a wall using the palm side of your hand.  Maintain a bent elbow the entire time.        4) SHOULDER - ISOMETRIC ADDUCTION  Gently push your elbow into the side of your body.   5) SHOULDER - ISOMETRIC ABDUCTION  Gently push your elbow out to the side into a wall with your elbow bent.     Perform each exercise ____10-15____ reps. 2-3x days.   1) Protraction   Start by holding a wand or cane at chest height.  Next, slowly push the wand outwards in front of your body so that your elbows become fully straightened. Then, return to the original position.     2) Shoulder FLEXION   In the standing position, hold a wand/cane with both arms, palms down on both sides. Raise up the wand/cane allowing your unaffected arm to perform most of the effort. Your affected arm should be partially relaxed.      3) Internal/External ROTATION   In the standing position, hold a wand/cane with both hands keeping your elbows bent. Move your arms and wand/cane to one side.  Your affected arm should be partially relaxed while your unaffected arm performs most of the effort.       4) Shoulder ABDUCTION   While holding a wand/cane palm face up on the injured side and palm face down on the uninjured side, slowly raise up your injured arm to the side.        5) Horizontal Abduction/Adduction      Straight arms holding cane at shoulder height, bring cane to right, center, left. Repeat starting to left.   Copyright  VHI. All rights reserved.

## 2020-01-28 ENCOUNTER — Ambulatory Visit (HOSPITAL_COMMUNITY): Payer: Medicare Other | Admitting: Occupational Therapy

## 2020-01-28 ENCOUNTER — Other Ambulatory Visit: Payer: Self-pay

## 2020-01-28 DIAGNOSIS — M25631 Stiffness of right wrist, not elsewhere classified: Secondary | ICD-10-CM

## 2020-01-28 DIAGNOSIS — M25531 Pain in right wrist: Secondary | ICD-10-CM

## 2020-01-28 DIAGNOSIS — R29898 Other symptoms and signs involving the musculoskeletal system: Secondary | ICD-10-CM

## 2020-01-28 DIAGNOSIS — M25511 Pain in right shoulder: Secondary | ICD-10-CM | POA: Diagnosis not present

## 2020-01-28 DIAGNOSIS — M25611 Stiffness of right shoulder, not elsewhere classified: Secondary | ICD-10-CM

## 2020-01-28 NOTE — Therapy (Signed)
[facility] [place], [place], [address] Phone: [phone]   Fax:  [phone]  Occupational Therapy Treatment  Patient Details  Name: Jessica [name] MRN: [id number] Date of Birth: [birth year] Referring Provider (OT): Dr. Cheral Bay    Progress Note Reporting Period 01/05/20 to 01/29/20  See note below for Objective Data and Assessment of Progress/Goals.    Encounter Date: 01/28/2020   OT End of Session - 01/28/20 1217    Visit Number 10    Number of Visits 24    Date for OT Re-Evaluation 03/01/20   mini reassess on 7/21   Authorization Type Medicare and BCBS    OT Start Time 1116    OT Stop Time 1154    OT Time Calculation (min) 38 min    Activity Tolerance Patient tolerated treatment well    Behavior During Therapy WFL for tasks assessed/performed           Past Medical History:  Diagnosis Date  . Anemia    per patient, had it years ago before hysterectomy years ago  . Arthritis   . Asthma   . Bronchitis   . Carpal tunnel syndrome of right wrist   . Chronic venous insufficiency   . GERD (gastroesophageal reflux disease)   . History of hiatal hernia   . Hypertension   . Pneumonia   . PONV (postoperative nausea and vomiting)     Past Surgical History:  Procedure Laterality Date  . ABDOMINAL HYSTERECTOMY    . CARPAL TUNNEL RELEASE Right 12/16/2019   Procedure: RIGHT CARPAL TUNNEL RELEASE;  Surgeon: Meredith Pel, MD;  Location: [facility];  Service: Orthopedics;  Laterality: Right;  . COLONOSCOPY  06/05/2012   Procedure: COLONOSCOPY;  Surgeon: Rogene Houston, MD;  [facility];  Service: Endoscopy;  Laterality: N/A;  125-changed to 1225 Ann to notify pt  . NISSEN FUNDOPLICATION     x2  . SHOULDER ARTHROSCOPY WITH SUBACROMIAL DECOMPRESSION, ROTATOR CUFF REPAIR AND BICEP TENDON REPAIR Right 12/16/2019   Procedure: RIGHT SHOULDER ARTHROSCOPY, BICEPS TENODESIS, MINI OPEN ROTATOR CUFF TEAR  REPAIR, DEBRIDEMENT;  Surgeon: Meredith Pel, MD;  Location: [facility];  Service: Orthopedics;  Laterality: Right;    There were no vitals filed for this visit.   Subjective Assessment - 01/28/20 1122    Currently in Pain? Yes    Pain Score 3               [facility] OT Assessment - 01/28/20 1149      Assessment   Medical Diagnosis S/P R RCR, Biceps Tenodesis, and CTR    Referring Provider (OT) Dr. Cheral Bay      Precautions   Precautions Shoulder    Type of Shoulder Precautions passive and aa/rom of right shoulder, progress to a/rom on 7/1.  sling until 6/26.  wrist:  progress as tolerated                     OT Treatments/Exercises (OP) - 01/28/20 1132      Exercises   Exercises Shoulder      Shoulder Exercises: Seated   Elevation AROM;15 reps    Extension AROM;15 reps    Retraction AROM;15 reps    Row AAROM;10 reps    External Rotation AAROM;10 reps    Internal Rotation AAROM;10 reps    Flexion AAROM;10 reps    Abduction AAROM;10 reps      Shoulder Exercises:  Therapy Ball   Flexion Other (comment)   2'   ABduction Other (comment)   2'     Shoulder Exercises: ROM/Strengthening   Other ROM/Strengthening Exercises ballcircles around chair for 2'. big blue therapy ball      Shoulder Exercises: Isometric Strengthening   Flexion 5X5"    Extension 5X5"    External Rotation 5X5"    Internal Rotation 5X5"    ABduction 5X5"      Wrist Exercises   Other wrist exercises AROM wrist flexion/extension 20x; radial and ulnar deviation 20x      Manual Therapy   Manual Therapy Myofascial release    Manual therapy comments manual therapy completed seperately from all other interventions this date    Myofascial Release myofascial release and manual stretching to decrease pain and fascial restrictions and improve pain free mobility in right shoulder, upper arm, scapular, and shoulder region, and wrist region.                      OT Short Term  Goals - 01/12/20 2118      OT SHORT TERM GOAL #1   Title Patient will be educated and independent with HEP for RUE A/ROM and strength.    Time 4    Period Weeks    Status On-going    Target Date 02/02/20      OT SHORT TERM GOAL #2   Title Patient will demonstrate WFL P/ROM in right shoulder, forearm, wrist, and hand in order to improve independence with B/ADLs.    Time 4    Period Weeks    Status On-going      OT SHORT TERM GOAL #3   Title Patient will demonstrate 3+/5 or better strength in her right shoulder, forearm, wrist in order to assist with caring for her mother and complete gardening tasks.    Time 4    Period Weeks    Status On-going      OT SHORT TERM GOAL #4   Title Patient will improve right grip and pinch strength by 50% for improved ability to open jars and containers.    Time 4    Period Weeks    Status On-going      OT SHORT TERM GOAL #5   Title Patient will decrease pain to 3/10 or better in her RUE in order to complete ADLs with less difficulty.    Time 4    Period Weeks    Status On-going             OT Long Term Goals - 01/12/20 2119      OT LONG TERM GOAL #1   Title Patient will return to prior level of independence using right arm and hand as dominant with all desired B/IADLs, leisure activities.    Time 8    Period Weeks    Status On-going      OT LONG TERM GOAL #2   Title Patient will improve RUE A/ROM to WNL for improved ability to reach overhead, behind back and use hand to push self out of a chair.    Time 8    Period Weeks    Status On-going      OT LONG TERM GOAL #3   Title Patient will improve right upper extremity strength to 5/5 for improved ability to lift tools and bags of soil when gardening and pick up laundry baskets.    Time 8    Period Weeks  Status On-going      OT LONG TERM GOAL #4   Title Patient will improve right grip strength to 65 pounds and pinch strength to 15 pounds for improved ability to open containers  and complete craft projects.    Time 8    Period Weeks    Status On-going      OT LONG TERM GOAL #5   Title Patient will decrease right upper extremity pain to 2/10 or better while completing ADLs, and leisure activities.    Time 8    Period Weeks    Status On-going      Long Term Additional Goals   Additional Long Term Goals Yes                 Plan - 01/28/20 1218    Clinical Impression Statement A: Continued myofascial release and PROM. Pt performing PROM with therapy ball stretches demonstrating increased tolerance and ROM. AAROM with dowel rod in sitting wiht tactile cues for shoulder hiking. Continued proximal strengthening with isometrics and ball circle. Cues throughout for form and technique. Pt very motivated.    Plan P:  aa/rom in seated, ball circles with less facilitation as range of motion improves.    OT Home Exercise Plan inital evaluation:  table slides, tendon glides. 7/9 isometrics.           Patient will benefit from skilled therapeutic intervention in order to improve the following deficits and impairments:           Visit Diagnosis: Stiffness of right shoulder, not elsewhere classified  Stiffness of right wrist, not elsewhere classified  Other symptoms and signs involving the musculoskeletal system  Pain in right wrist  Acute pain of right shoulder    Problem List Patient Active Problem List   Diagnosis Date Noted  . Traumatic tear of supraspinatus tendon of right shoulder 11/18/2019  . Tear of right infraspinatus tendon 11/18/2019  . Right carpal tunnel syndrome 11/04/2019  . Acute pain of right shoulder 11/04/2019  . Hypertension 04/12/2019  . Glucose intolerance 04/12/2019  . Chronic venous insufficiency 04/06/2019  . Osteopenia 04/06/2019    Neal Dy, MSOT, OTR/L 01/28/2020, 12:20 PM  [facility] [address] [place], [place], [address] Phone: [phone]   Fax:   [phone]  Name: Jessica [name] MRN: [id number] Date of Birth: 1956/02/09

## 2020-01-31 ENCOUNTER — Other Ambulatory Visit: Payer: Self-pay

## 2020-01-31 ENCOUNTER — Ambulatory Visit (HOSPITAL_COMMUNITY): Payer: Medicare Other | Admitting: Occupational Therapy

## 2020-01-31 ENCOUNTER — Encounter (HOSPITAL_COMMUNITY): Payer: Self-pay | Admitting: Occupational Therapy

## 2020-01-31 DIAGNOSIS — R29898 Other symptoms and signs involving the musculoskeletal system: Secondary | ICD-10-CM

## 2020-01-31 DIAGNOSIS — M25631 Stiffness of right wrist, not elsewhere classified: Secondary | ICD-10-CM

## 2020-01-31 DIAGNOSIS — M25511 Pain in right shoulder: Secondary | ICD-10-CM | POA: Diagnosis not present

## 2020-01-31 DIAGNOSIS — M25611 Stiffness of right shoulder, not elsewhere classified: Secondary | ICD-10-CM

## 2020-01-31 DIAGNOSIS — M25531 Pain in right wrist: Secondary | ICD-10-CM

## 2020-01-31 NOTE — Therapy (Signed)
[facility] [place], [place], [address] Phone: [phone]   Fax:  [phone]  Occupational Therapy Treatment  Patient Details  Name: Jessica [name] MRN: [id number] Date of Birth: 10-10-1955 Referring Provider (OT): Dr. Cheral Bay   Encounter Date: 01/31/2020   OT End of Session - 01/31/20 1814    Visit Number 11    Number of Visits 24    Date for OT Re-Evaluation 03/01/20   mini reassess on 7/21   Authorization Type Medicare and BCBS    Progress Note Due on Visit 22    OT Start Time 1732    OT Stop Time 1815    OT Time Calculation (min) 43 min    Activity Tolerance Patient tolerated treatment well    Behavior During Therapy [facility] for tasks assessed/performed           Past Medical History:  Diagnosis Date   Anemia    per patient, had it years ago before hysterectomy years ago   Arthritis    Asthma    Bronchitis    Carpal tunnel syndrome of right wrist    Chronic venous insufficiency    GERD (gastroesophageal reflux disease)    History of hiatal hernia    Hypertension    Pneumonia    PONV (postoperative nausea and vomiting)     Past Surgical History:  Procedure Laterality Date   ABDOMINAL HYSTERECTOMY     CARPAL TUNNEL RELEASE Right 12/16/2019   Procedure: RIGHT CARPAL TUNNEL RELEASE;  Surgeon: Meredith Pel, MD;  Location: [facility];  Service: Orthopedics;  Laterality: Right;   COLONOSCOPY  06/05/2012   Procedure: COLONOSCOPY;  Surgeon: Rogene Houston, MD;  [facility];  Service: Endoscopy;  Laterality: N/A;  125-changed to 1225 Ann to notify pt   NISSEN FUNDOPLICATION     x2   SHOULDER ARTHROSCOPY WITH SUBACROMIAL DECOMPRESSION, ROTATOR CUFF REPAIR AND BICEP TENDON REPAIR Right 12/16/2019   Procedure: RIGHT SHOULDER ARTHROSCOPY, BICEPS TENODESIS, MINI OPEN ROTATOR CUFF TEAR REPAIR, DEBRIDEMENT;  Surgeon: Meredith Pel, MD;  Location: [facility];  Service: Orthopedics;   Laterality: Right;    There were no vitals filed for this visit.   Subjective Assessment - 01/31/20 1812    Subjective  S: I did a lot of packing of boxes this weekend, so I am a little sore.    Currently in Pain? Yes    Pain Score 3     Pain Location Shoulder    Pain Orientation Right              [facility] OT Assessment - 01/31/20 1744      Assessment   Medical Diagnosis S/P R RCR, Biceps Tenodesis, and CTR    Referring Provider (OT) Dr. Cheral Bay      Precautions   Precautions Shoulder    Type of Shoulder Precautions passive and aa/rom of right shoulder, progress to a/rom on 7/1.  sling until 6/26.  wrist:  progress as tolerated                     OT Treatments/Exercises (OP) - 01/31/20 1743      Exercises   Exercises Shoulder      Shoulder Exercises: Supine   Protraction AAROM;10 reps    Horizontal ABduction PROM;AAROM;10 reps    External Rotation PROM;AAROM;10 reps    Internal Rotation PROM;AAROM;10 reps    Flexion PROM;AAROM;10 reps  ABduction PROM;AAROM;10 reps      Shoulder Exercises: Seated   Row AAROM;10 reps    External Rotation AAROM;10 reps    Internal Rotation AAROM;10 reps    Flexion AAROM;10 reps    Abduction AAROM;10 reps      Shoulder Exercises: Pulleys   Flexion 2 minutes    ABduction 2 minutes      Shoulder Exercises: Therapy Ball   Flexion Other (comment)   2'   ABduction Other (comment)   2'     Shoulder Exercises: ROM/Strengthening   Thumb Tacks 10x. low level      Modalities   Modalities Moist Heat      Moist Heat Therapy   Number Minutes Moist Heat 3 Minutes    Moist Heat Location Shoulder      Manual Therapy   Manual Therapy Myofascial release    Manual therapy comments manual therapy completed seperately from all other interventions this date    Myofascial Release myofascial release and manual stretching to decrease pain and fascial restrictions and improve pain free mobility in right shoulder, upper  arm, scapular, and shoulder region, and wrist region.                      OT Short Term Goals - 01/12/20 2118      OT SHORT TERM GOAL #1   Title Patient will be educated and independent with HEP for RUE A/ROM and strength.    Time 4    Period Weeks    Status On-going    Target Date 02/02/20      OT SHORT TERM GOAL #2   Title Patient will demonstrate WFL P/ROM in right shoulder, forearm, wrist, and hand in order to improve independence with B/ADLs.    Time 4    Period Weeks    Status On-going      OT SHORT TERM GOAL #3   Title Patient will demonstrate 3+/5 or better strength in her right shoulder, forearm, wrist in order to assist with caring for her mother and complete gardening tasks.    Time 4    Period Weeks    Status On-going      OT SHORT TERM GOAL #4   Title Patient will improve right grip and pinch strength by 50% for improved ability to open jars and containers.    Time 4    Period Weeks    Status On-going      OT SHORT TERM GOAL #5   Title Patient will decrease pain to 3/10 or better in her RUE in order to complete ADLs with less difficulty.    Time 4    Period Weeks    Status On-going             OT Long Term Goals - 01/12/20 2119      OT LONG TERM GOAL #1   Title Patient will return to prior level of independence using right arm and hand as dominant with all desired B/IADLs, leisure activities.    Time 8    Period Weeks    Status On-going      OT LONG TERM GOAL #2   Title Patient will improve RUE A/ROM to WNL for improved ability to reach overhead, behind back and use hand to push self out of a chair.    Time 8    Period Weeks    Status On-going      OT LONG TERM GOAL #3   Title  Patient will improve right upper extremity strength to 5/5 for improved ability to lift tools and bags of soil when gardening and pick up laundry baskets.    Time 8    Period Weeks    Status On-going      OT LONG TERM GOAL #4   Title Patient will improve  right grip strength to 65 pounds and pinch strength to 15 pounds for improved ability to open containers and complete craft projects.    Time 8    Period Weeks    Status On-going      OT LONG TERM GOAL #5   Title Patient will decrease right upper extremity pain to 2/10 or better while completing ADLs, and leisure activities.    Time 8    Period Weeks    Status On-going      Long Term Additional Goals   Additional Long Term Goals Yes                 Plan - 01/31/20 1819    Clinical Impression Statement A: Continued myofascial release and PROM. Pt performing PROM with therapy ball stretches and pulleys demonstrating increased tolerance and ROM. AAROM with dowel rod in supine and sitting; Requiring tactile cues for shoulder hiking in sitting. Continued proximal strengthening with thumb tacks. Adding UB bike and pt reporting good tolerance. Cues throughout for form and technique. Pt very motivated.    Body Structure / Function / Physical Skills ADL;Strength;Pain;UE functional use;ROM;IADL;Fascial restriction;Scar mobility;Wound;Coordination;FMC;Muscle spasms;Skin integrity;Decreased knowledge of precautions    Plan P: Continue myofascial release, PROM, therapy ball stretch, pulleys, and transitioning to AAROM more smoothly with dowel    OT Home Exercise Plan inital evaluation:  table slides, tendon glides. 7/9 isometrics.           Patient will benefit from skilled therapeutic intervention in order to improve the following deficits and impairments:   Body Structure / Function / Physical Skills: ADL, Strength, Pain, UE functional use, ROM, IADL, Fascial restriction, Scar mobility, Wound, Coordination, FMC, Muscle spasms, Skin integrity, Decreased knowledge of precautions       Visit Diagnosis: Stiffness of right shoulder, not elsewhere classified  Stiffness of right wrist, not elsewhere classified  Other symptoms and signs involving the musculoskeletal system  Pain in right  wrist  Acute pain of right shoulder    Problem List Patient Active Problem List   Diagnosis Date Noted   Traumatic tear of supraspinatus tendon of right shoulder 11/18/2019   Tear of right infraspinatus tendon 11/18/2019   Right carpal tunnel syndrome 11/04/2019   Acute pain of right shoulder 11/04/2019   Hypertension 04/12/2019   Glucose intolerance 04/12/2019   Chronic venous insufficiency 04/06/2019   Osteopenia 04/06/2019    Neal Dy, MSOT, OTR/L 01/31/2020, 6:25 PM  [facility] [place], [place], [address] Phone: [phone]   Fax:  [phone]  Name: Jessica [name] MRN: [id number] Date of Birth: 04-20-1956

## 2020-02-02 ENCOUNTER — Ambulatory Visit (HOSPITAL_COMMUNITY): Payer: Medicare Other | Admitting: Occupational Therapy

## 2020-02-02 ENCOUNTER — Other Ambulatory Visit: Payer: Self-pay

## 2020-02-02 ENCOUNTER — Encounter (HOSPITAL_COMMUNITY): Payer: Self-pay | Admitting: Occupational Therapy

## 2020-02-02 DIAGNOSIS — M25611 Stiffness of right shoulder, not elsewhere classified: Secondary | ICD-10-CM

## 2020-02-02 DIAGNOSIS — M25511 Pain in right shoulder: Secondary | ICD-10-CM | POA: Diagnosis not present

## 2020-02-02 DIAGNOSIS — R29898 Other symptoms and signs involving the musculoskeletal system: Secondary | ICD-10-CM

## 2020-02-02 NOTE — Therapy (Signed)
[place], [place], [address] Phone: [phone]   Fax:  [phone]  Occupational Therapy Reassessment & Treatment  Patient Details  Name: Jessica [name] MRN: [id number] Date of Birth: [birth year] Referring Provider (OT): Dr. Cheral Bay   Encounter Date: 02/02/2020   OT End of Session - 02/02/20 1052    Visit Number 12    Number of Visits 24    Date for OT Re-Evaluation 03/01/20    Authorization Type Medicare and BCBS    Progress Note Due on Visit 20    OT Start Time 0904    OT Stop Time [phone]    OT Time Calculation (min) 38 min    Activity Tolerance Patient tolerated treatment well    Behavior During Therapy [facility] for tasks assessed/performed           Past Medical History:  Diagnosis Date  . Anemia    per patient, had it years ago before hysterectomy years ago  . Arthritis   . Asthma   . Bronchitis   . Carpal tunnel syndrome of right wrist   . Chronic venous insufficiency   . GERD (gastroesophageal reflux disease)   . History of hiatal hernia   . Hypertension   . Pneumonia   . PONV (postoperative nausea and vomiting)     Past Surgical History:  Procedure Laterality Date  . ABDOMINAL HYSTERECTOMY    . CARPAL TUNNEL RELEASE Right 12/16/2019   Procedure: RIGHT CARPAL TUNNEL RELEASE;  Surgeon: Meredith Pel, MD;  Location: [place];  Service: Orthopedics;  Laterality: Right;  . COLONOSCOPY  06/05/2012   Procedure: COLONOSCOPY;  Surgeon: Rogene Houston, MD;  [facility];  Service: Endoscopy;  Laterality: N/A;  125-changed to 1225 Ann to notify pt  . NISSEN FUNDOPLICATION     x2  . SHOULDER ARTHROSCOPY WITH SUBACROMIAL DECOMPRESSION, ROTATOR CUFF REPAIR AND BICEP TENDON REPAIR Right 12/16/2019   Procedure: RIGHT SHOULDER ARTHROSCOPY, BICEPS TENODESIS, MINI OPEN ROTATOR CUFF TEAR REPAIR, DEBRIDEMENT;  Surgeon: Meredith Pel, MD;  Location: [place];  Service: Orthopedics;   Laterality: Right;    There were no vitals filed for this visit.   Subjective Assessment - 02/02/20 0907    Subjective  S: I can put deodorant on now.    Currently in Pain? Yes    Pain Score 3     Pain Location Shoulder    Pain Orientation Right    Pain Descriptors / Indicators Aching;Sore    Pain Type Acute pain    Pain Radiating Towards N/A    Pain Onset More than a month ago    Pain Frequency Intermittent    Aggravating Factors  movement    Pain Relieving Factors rest    Effect of Pain on Daily Activities moderate effect on ADLs    Multiple Pain Sites No              [facility] OT Assessment - 02/02/20 0907      Assessment   Medical Diagnosis S/P R RCR, Biceps Tenodesis, and CTR      Precautions   Precautions Shoulder    Type of Shoulder Precautions passive and aa/rom of right shoulder, progress to a/rom on 7/1.  sling until 6/26.  wrist:  progress as tolerated       AROM   Overall AROM Comments Assessed seated, er/IR adducted    AROM Assessment Site Shoulder    Right/Left Shoulder Right  Right Shoulder Flexion 54 Degrees   not previously assessed   Right Shoulder ABduction 87 Degrees   not previously assessed   Right Shoulder Internal Rotation 70 Degrees   not previously assessed-baseline   Right Shoulder External Rotation 45 Degrees   not previously assessed     PROM   Overall PROM Comments assessed in supine, external and internal with shoulder adducted, wrist P/ROM is [facility]    PROM Assessment Site Shoulder    Right/Left Shoulder Right    Right Shoulder Flexion 140 Degrees   110 previous   Right Shoulder ABduction 150 Degrees   100 previous   Right Shoulder Internal Rotation 65 Degrees   same as previous   Right Shoulder External Rotation 70 Degrees   65 previous     Strength   Overall Strength Comments Assessed seated, er/IR adducted    Strength Assessment Site Shoulder    Right/Left Shoulder Right    Right Shoulder Flexion 2+/5   not previously assessed    Right Shoulder ABduction 3-/5   not previously assessed   Right Shoulder Internal Rotation 3/5   not previously assessed   Right Shoulder External Rotation 3/5   not previously assessed                   OT Treatments/Exercises (OP) - 02/02/20 0908      Exercises   Exercises Shoulder      Shoulder Exercises: Supine   Protraction PROM;5 reps    Horizontal ABduction PROM;5 reps    External Rotation PROM;5 reps    Internal Rotation PROM;5 reps    Flexion PROM;5 reps    ABduction PROM;5 reps      Shoulder Exercises: Seated   Protraction AAROM;10 reps    External Rotation AAROM;10 reps    Internal Rotation AAROM;10 reps    Flexion AAROM;10 reps    Abduction AAROM;10 reps      Shoulder Exercises: Standing   Row Theraband;10 reps    Theraband Level (Shoulder Row) Level 2 (Red)      Shoulder Exercises: Pulleys   Flexion 1 minute    ABduction 1 minute      Shoulder Exercises: ROM/Strengthening   Wall Wash 1' flexion    Other ROM/Strengthening Exercises PVC pipe slide, flexion, 10X      Shoulder Exercises: Isometric Strengthening   Flexion --   standing 3x15"     Manual Therapy   Manual Therapy Myofascial release    Manual therapy comments manual therapy completed seperately from all other interventions this date    Myofascial Release myofascial release and manual stretching to decrease pain and fascial restrictions and improve pain free mobility in right shoulder, upper arm, scapular, and shoulder region, and wrist region.                      OT Short Term Goals - 01/12/20 2118      OT SHORT TERM GOAL #1   Title Patient will be educated and independent with HEP for RUE A/ROM and strength.    Time 4    Period Weeks    Status On-going    Target Date 02/02/20      OT SHORT TERM GOAL #2   Title Patient will demonstrate WFL P/ROM in right shoulder, forearm, wrist, and hand in order to improve independence with B/ADLs.    Time 4    Period Weeks     Status On-going      OT  SHORT TERM GOAL #3   Title Patient will demonstrate 3+/5 or better strength in her right shoulder, forearm, wrist in order to assist with caring for her mother and complete gardening tasks.    Time 4    Period Weeks    Status On-going      OT SHORT TERM GOAL #4   Title Patient will improve right grip and pinch strength by 50% for improved ability to open jars and containers.    Time 4    Period Weeks    Status On-going      OT SHORT TERM GOAL #5   Title Patient will decrease pain to 3/10 or better in her RUE in order to complete ADLs with less difficulty.    Time 4    Period Weeks    Status On-going             OT Long Term Goals - 01/12/20 2119      OT LONG TERM GOAL #1   Title Patient will return to prior level of independence using right arm and hand as dominant with all desired B/IADLs, leisure activities.    Time 8    Period Weeks    Status On-going      OT LONG TERM GOAL #2   Title Patient will improve RUE A/ROM to WNL for improved ability to reach overhead, behind back and use hand to push self out of a chair.    Time 8    Period Weeks    Status On-going      OT LONG TERM GOAL #3   Title Patient will improve right upper extremity strength to 5/5 for improved ability to lift tools and bags of soil when gardening and pick up laundry baskets.    Time 8    Period Weeks    Status On-going      OT LONG TERM GOAL #4   Title Patient will improve right grip strength to 65 pounds and pinch strength to 15 pounds for improved ability to open containers and complete craft projects.    Time 8    Period Weeks    Status On-going      OT LONG TERM GOAL #5   Title Patient will decrease right upper extremity pain to 2/10 or better while completing ADLs, and leisure activities.    Time 8    Period Weeks    Status On-going      Long Term Additional Goals   Additional Long Term Goals Yes                 Plan - 02/02/20 1053    Clinical  Impression Statement A: Mini-reassessment completed this date, passive and active measurements obtained, strength measured as well. Pt has made good progress with P/ROM, A/ROM is improving in all ranges however flexion continues to be limited due to poor muscle activation in this plane. Pt reports pain/tightness limiting HEP completion sometimes. Continued with manual techniques and passive stretching today, as well as AA/ROM in sitting. Added pvc pipe slide and row with theraband. Verbal cuing for form and technique.    Body Structure / Function / Physical Skills ADL;Strength;Pain;UE functional use;ROM;IADL;Fascial restriction;Scar mobility;Wound;Coordination;FMC;Muscle spasms;Skin integrity;Decreased knowledge of precautions    Plan P: Take wrist measurements. Continue working on activation required for flexion, AA/ROM with minimal trapezius activation    OT Home Exercise Plan inital evaluation:  table slides, tendon glides. 7/9 isometrics.    Consulted and Agree with Plan of Care Patient  Patient will benefit from skilled therapeutic intervention in order to improve the following deficits and impairments:   Body Structure / Function / Physical Skills: ADL, Strength, Pain, UE functional use, ROM, IADL, Fascial restriction, Scar mobility, Wound, Coordination, FMC, Muscle spasms, Skin integrity, Decreased knowledge of precautions       Visit Diagnosis: Stiffness of right shoulder, not elsewhere classified  Other symptoms and signs involving the musculoskeletal system  Acute pain of right shoulder    Problem List Patient Active Problem List   Diagnosis Date Noted  . Traumatic tear of supraspinatus tendon of right shoulder 11/18/2019  . Tear of right infraspinatus tendon 11/18/2019  . Right carpal tunnel syndrome 11/04/2019  . Acute pain of right shoulder 11/04/2019  . Hypertension 04/12/2019  . Glucose intolerance 04/12/2019  . Chronic venous insufficiency 04/06/2019  .  Osteopenia 04/06/2019   Guadelupe Sabin, OTR/L  [phone] 02/02/2020, 10:58 AM  [facility] [address] [place], [place], [address] Phone: [phone]   Fax:  [phone]  Name: Jessica [name] MRN: [id number] Date of Birth: 1955/10/15

## 2020-02-04 ENCOUNTER — Other Ambulatory Visit: Payer: Self-pay

## 2020-02-04 ENCOUNTER — Encounter (HOSPITAL_COMMUNITY): Payer: Self-pay | Admitting: Occupational Therapy

## 2020-02-04 ENCOUNTER — Ambulatory Visit (HOSPITAL_COMMUNITY): Payer: Medicare Other | Admitting: Occupational Therapy

## 2020-02-04 DIAGNOSIS — R29898 Other symptoms and signs involving the musculoskeletal system: Secondary | ICD-10-CM

## 2020-02-04 DIAGNOSIS — M25631 Stiffness of right wrist, not elsewhere classified: Secondary | ICD-10-CM

## 2020-02-04 DIAGNOSIS — M25531 Pain in right wrist: Secondary | ICD-10-CM

## 2020-02-04 DIAGNOSIS — M25511 Pain in right shoulder: Secondary | ICD-10-CM | POA: Diagnosis not present

## 2020-02-04 DIAGNOSIS — M25611 Stiffness of right shoulder, not elsewhere classified: Secondary | ICD-10-CM

## 2020-02-04 NOTE — Therapy (Signed)
[place], [place], [address] Phone: [phone]   Fax:  [phone]  Occupational Therapy Treatment  Patient Details  Name: Jessica [name] MRN: [id number] Date of Birth: 04/10/1956 Referring Provider (OT): Dr. Cheral Bay   Encounter Date: 02/04/2020   OT End of Session - 02/04/20 1329    Visit Number 13    Number of Visits 24    Date for OT Re-Evaluation 03/01/20    Authorization Type Medicare and BCBS    Progress Note Due on Visit 20    OT Start Time 1115    OT Stop Time 1200    OT Time Calculation (min) 45 min    Activity Tolerance Patient tolerated treatment well    Behavior During Therapy [facility] for tasks assessed/performed           Past Medical History:  Diagnosis Date  . Anemia    per patient, had it years ago before hysterectomy years ago  . Arthritis   . Asthma   . Bronchitis   . Carpal tunnel syndrome of right wrist   . Chronic venous insufficiency   . GERD (gastroesophageal reflux disease)   . History of hiatal hernia   . Hypertension   . Pneumonia   . PONV (postoperative nausea and vomiting)     Past Surgical History:  Procedure Laterality Date  . ABDOMINAL HYSTERECTOMY    . CARPAL TUNNEL RELEASE Right 12/16/2019   Procedure: RIGHT CARPAL TUNNEL RELEASE;  Surgeon: Meredith Pel, MD;  Location: [place];  Service: Orthopedics;  Laterality: Right;  . COLONOSCOPY  06/05/2012   Procedure: COLONOSCOPY;  Surgeon: Rogene Houston, MD;  [facility];  Service: Endoscopy;  Laterality: N/A;  125-changed to 1225 Ann to notify pt  . NISSEN FUNDOPLICATION     x2  . SHOULDER ARTHROSCOPY WITH SUBACROMIAL DECOMPRESSION, ROTATOR CUFF REPAIR AND BICEP TENDON REPAIR Right 12/16/2019   Procedure: RIGHT SHOULDER ARTHROSCOPY, BICEPS TENODESIS, MINI OPEN ROTATOR CUFF TEAR REPAIR, DEBRIDEMENT;  Surgeon: Meredith Pel, MD;  Location: [place];  Service: Orthopedics;  Laterality: Right;     There were no vitals filed for this visit.   Subjective Assessment - 02/04/20 1116    Subjective  S: I have having difficulty with that forward movement    Currently in Pain? Yes    Pain Score 3     Pain Location Shoulder    Pain Orientation Right    Pain Descriptors / Indicators Tightness    Pain Onset More than a month ago              [facility] OT Assessment - 02/04/20 1144      Assessment   Medical Diagnosis S/P R RCR, Biceps Tenodesis, and CTR    Referring Provider (OT) Dr. Cheral Bay      Precautions   Precautions Shoulder    Type of Shoulder Precautions passive and aa/rom of right shoulder, progress to a/rom on 7/1.  sling until 6/26.  wrist:  progress as tolerated                     OT Treatments/Exercises (OP) - 02/04/20 1127      Exercises   Exercises Shoulder      Shoulder Exercises: Supine   Horizontal ABduction PROM;5 reps    External Rotation PROM;5 reps    Internal Rotation PROM;5 reps    Flexion PROM;5 reps    ABduction  PROM;5 reps      Shoulder Exercises: Seated   Protraction AAROM;15 reps    External Rotation AAROM;15 reps    External Rotation Limitations Support at elbow     Internal Rotation AAROM;15 reps    Internal Rotation Limitations Support at elbow     Flexion AAROM;15 reps    Abduction AAROM;15 reps    Other Seated Exercises Overhead presses with dowel rod. AAROm. 15x      Shoulder Exercises: Standing   Extension Theraband;15 reps    Theraband Level (Shoulder Extension) Level 2 (Red)    Row Theraband;15 reps    Theraband Level (Shoulder Row) Level 2 (Red)      Shoulder Exercises: Pulleys   Flexion 1 minute    ABduction 1 minute      Shoulder Exercises: ROM/Strengthening   UBE (Upper Arm Bike) 2' forward. 2' backwards. level 1. pace 4-5. elevating position for higher flexion    Wall Wash 15x forward flexion; support at elbow for movement pattern and shoulder to prevent hiking    Other ROM/Strengthening  Exercises PVC pipe slide, flexion, 10X      Shoulder Exercises: Stretch   Wall Stretch - Flexion 10 seconds;5 reps      Manual Therapy   Manual Therapy Myofascial release    Manual therapy comments manual therapy completed seperately from all other interventions this date    Myofascial Release myofascial release and manual stretching to decrease pain and fascial restrictions and improve pain free mobility in right shoulder, upper arm, scapular, and shoulder region, and wrist region.                    OT Education - 02/04/20 1503    Education Details Providing pt with handout on shoulder stretches. Focus on forward flexion only. Practice with pt.    Person(s) Educated Patient    Methods Explanation;Demonstration;Handout    Comprehension Verbalized understanding;Returned demonstration            OT Short Term Goals - 01/12/20 2118      OT SHORT TERM GOAL #1   Title Patient will be educated and independent with HEP for RUE A/ROM and strength.    Time 4    Period Weeks    Status On-going    Target Date 02/02/20      OT SHORT TERM GOAL #2   Title Patient will demonstrate WFL P/ROM in right shoulder, forearm, wrist, and hand in order to improve independence with B/ADLs.    Time 4    Period Weeks    Status On-going      OT SHORT TERM GOAL #3   Title Patient will demonstrate 3+/5 or better strength in her right shoulder, forearm, wrist in order to assist with caring for her mother and complete gardening tasks.    Time 4    Period Weeks    Status On-going      OT SHORT TERM GOAL #4   Title Patient will improve right grip and pinch strength by 50% for improved ability to open jars and containers.    Time 4    Period Weeks    Status On-going      OT SHORT TERM GOAL #5   Title Patient will decrease pain to 3/10 or better in her RUE in order to complete ADLs with less difficulty.    Time 4    Period Weeks    Status On-going  OT Long Term Goals -  01/12/20 2119      OT LONG TERM GOAL #1   Title Patient will return to prior level of independence using right arm and hand as dominant with all desired B/IADLs, leisure activities.    Time 8    Period Weeks    Status On-going      OT LONG TERM GOAL #2   Title Patient will improve RUE A/ROM to WNL for improved ability to reach overhead, behind back and use hand to push self out of a chair.    Time 8    Period Weeks    Status On-going      OT LONG TERM GOAL #3   Title Patient will improve right upper extremity strength to 5/5 for improved ability to lift tools and bags of soil when gardening and pick up laundry baskets.    Time 8    Period Weeks    Status On-going      OT LONG TERM GOAL #4   Title Patient will improve right grip strength to 65 pounds and pinch strength to 15 pounds for improved ability to open containers and complete craft projects.    Time 8    Period Weeks    Status On-going      OT LONG TERM GOAL #5   Title Patient will decrease right upper extremity pain to 2/10 or better while completing ADLs, and leisure activities.    Time 8    Period Weeks    Status On-going      Long Term Additional Goals   Additional Long Term Goals Yes                 Plan - 02/04/20 1344    Clinical Impression Statement A: Starting session with myofascial release and PROM in supine; noting continued fasical restriction in anterior portion of shoulder. Pt performing AAROM with dowel rod while sitting; tactile and verbal cues for decreased shoulder hiking. Adding wall washes in forward flexion, wall stretches in forward flexion, UB bike, and pulleys to work on normalized movement patterns and strength.    Body Structure / Function / Physical Skills ADL;Strength;Pain;UE functional use;ROM;IADL;Fascial restriction;Scar mobility;Wound;Coordination;FMC;Muscle spasms;Skin integrity;Decreased knowledge of precautions    Plan P: Continue working on activation required for flexion,  AA/ROM with minimal trapezius activation    OT Home Exercise Plan inital evaluation:  table slides, tendon glides. 7/9 isometrics.    Consulted and Agree with Plan of Care Patient           Patient will benefit from skilled therapeutic intervention in order to improve the following deficits and impairments:   Body Structure / Function / Physical Skills: ADL, Strength, Pain, UE functional use, ROM, IADL, Fascial restriction, Scar mobility, Wound, Coordination, FMC, Muscle spasms, Skin integrity, Decreased knowledge of precautions       Visit Diagnosis: Stiffness of right shoulder, not elsewhere classified  Other symptoms and signs involving the musculoskeletal system  Acute pain of right shoulder  Stiffness of right wrist, not elsewhere classified  Pain in right wrist    Problem List Patient Active Problem List   Diagnosis Date Noted  . Traumatic tear of supraspinatus tendon of right shoulder 11/18/2019  . Tear of right infraspinatus tendon 11/18/2019  . Right carpal tunnel syndrome 11/04/2019  . Acute pain of right shoulder 11/04/2019  . Hypertension 04/12/2019  . Glucose intolerance 04/12/2019  . Chronic venous insufficiency 04/06/2019  . Osteopenia 04/06/2019    Heywood Footman  Aizik Reh, [place], OTR/L 02/04/2020, 3:05 PM  [facility] [address] [place], [place], [address] Phone: [phone]   Fax:  [phone]  Name: LUCIANN GOSSETT MRN: [id number] Date of Birth: 09-04-1955

## 2020-02-04 NOTE — Patient Instructions (Signed)

## 2020-02-07 ENCOUNTER — Ambulatory Visit (INDEPENDENT_AMBULATORY_CARE_PROVIDER_SITE_OTHER): Payer: Medicare Other | Admitting: Orthopedic Surgery

## 2020-02-07 ENCOUNTER — Encounter: Payer: Self-pay | Admitting: Orthopedic Surgery

## 2020-02-07 ENCOUNTER — Encounter (HOSPITAL_COMMUNITY): Payer: Self-pay | Admitting: Occupational Therapy

## 2020-02-07 ENCOUNTER — Other Ambulatory Visit: Payer: Self-pay

## 2020-02-07 ENCOUNTER — Ambulatory Visit (HOSPITAL_COMMUNITY): Payer: Medicare Other | Admitting: Occupational Therapy

## 2020-02-07 VITALS — Ht 64.0 in | Wt 288.0 lb

## 2020-02-07 DIAGNOSIS — S46811A Strain of other muscles, fascia and tendons at shoulder and upper arm level, right arm, initial encounter: Secondary | ICD-10-CM

## 2020-02-07 DIAGNOSIS — M25631 Stiffness of right wrist, not elsewhere classified: Secondary | ICD-10-CM

## 2020-02-07 DIAGNOSIS — M25511 Pain in right shoulder: Secondary | ICD-10-CM | POA: Diagnosis not present

## 2020-02-07 DIAGNOSIS — M25611 Stiffness of right shoulder, not elsewhere classified: Secondary | ICD-10-CM

## 2020-02-07 DIAGNOSIS — M25531 Pain in right wrist: Secondary | ICD-10-CM

## 2020-02-07 DIAGNOSIS — R29898 Other symptoms and signs involving the musculoskeletal system: Secondary | ICD-10-CM

## 2020-02-07 NOTE — Therapy (Signed)
[place], [place], [address] Phone: [phone]   Fax:  [phone]  Occupational Therapy Treatment  Patient Details  Name: JEANENE MENA MRN: [id number] Date of Birth: 02-19-1956 Referring Provider (OT): Dr. Cheral Bay   Encounter Date: 02/07/2020   OT End of Session - 02/07/20 1428    Visit Number 14    Number of Visits 24    Date for OT Re-Evaluation 03/01/20    Authorization Type Medicare and BCBS    Progress Note Due on Visit 20    OT Start Time 1352    OT Stop Time 1430    OT Time Calculation (min) 38 min    Activity Tolerance Patient tolerated treatment well    Behavior During Therapy [facility] for tasks assessed/performed           Past Medical History:  Diagnosis Date  . Anemia    per patient, had it years ago before hysterectomy years ago  . Arthritis   . Asthma   . Bronchitis   . Carpal tunnel syndrome of right wrist   . Chronic venous insufficiency   . GERD (gastroesophageal reflux disease)   . History of hiatal hernia   . Hypertension   . Pneumonia   . PONV (postoperative nausea and vomiting)     Past Surgical History:  Procedure Laterality Date  . ABDOMINAL HYSTERECTOMY    . CARPAL TUNNEL RELEASE Right 12/16/2019   Procedure: RIGHT CARPAL TUNNEL RELEASE;  Surgeon: Meredith Pel, MD;  Location: [place];  Service: Orthopedics;  Laterality: Right;  . COLONOSCOPY  06/05/2012   Procedure: COLONOSCOPY;  Surgeon: Rogene Houston, MD;  [facility];  Service: Endoscopy;  Laterality: N/A;  125-changed to 1225 Ann to notify pt  . NISSEN FUNDOPLICATION     x2  . SHOULDER ARTHROSCOPY WITH SUBACROMIAL DECOMPRESSION, ROTATOR CUFF REPAIR AND BICEP TENDON REPAIR Right 12/16/2019   Procedure: RIGHT SHOULDER ARTHROSCOPY, BICEPS TENODESIS, MINI OPEN ROTATOR CUFF TEAR REPAIR, DEBRIDEMENT;  Surgeon: Meredith Pel, MD;  Location: [place];  Service: Orthopedics;  Laterality: Right;     There were no vitals filed for this visit.   Subjective Assessment - 02/07/20 1429    Subjective  S: Those wall stretches didn't dso well this weekend. I tried really hard, but my arm really hurts.    Currently in Pain? Yes    Pain Score 6     Pain Location Shoulder    Pain Orientation Right    Pain Descriptors / Indicators Sore    Pain Onset More than a month ago                        OT Treatments/Exercises (OP) - 02/07/20 1417      Exercises   Exercises Shoulder      Shoulder Exercises: Supine   Horizontal ABduction PROM;5 reps;AAROM;10 reps    External Rotation PROM;5 reps;AAROM;10 reps    Internal Rotation PROM;5 reps;AAROM;10 reps    Flexion PROM;5 reps;AAROM;10 reps    ABduction PROM;5 reps;AAROM;10 reps    Other Supine Exercises AAROM chest press; dowel rod. 10x.      Shoulder Exercises: Therapy Ball   Flexion 15 reps    ABduction 15 reps      Shoulder Exercises: ROM/Strengthening   Other ROM/Strengthening Exercises PVC pipe slide, flexion, 6x      Modalities   Modalities Moist Heat  Moist Heat Therapy   Number Minutes Moist Heat 5 Minutes    Moist Heat Location Shoulder      Manual Therapy   Manual Therapy Myofascial release    Manual therapy comments manual therapy completed seperately from all other interventions this date    Myofascial Release myofascial release and manual stretching to decrease pain and fascial restrictions and improve pain free mobility in right shoulder, upper arm, scapular, and shoulder region, and wrist region.                      OT Short Term Goals - 01/12/20 2118      OT SHORT TERM GOAL #1   Title Patient will be educated and independent with HEP for RUE A/ROM and strength.    Time 4    Period Weeks    Status On-going    Target Date 02/02/20      OT SHORT TERM GOAL #2   Title Patient will demonstrate WFL P/ROM in right shoulder, forearm, wrist, and hand in order to improve independence  with B/ADLs.    Time 4    Period Weeks    Status On-going      OT SHORT TERM GOAL #3   Title Patient will demonstrate 3+/5 or better strength in her right shoulder, forearm, wrist in order to assist with caring for her mother and complete gardening tasks.    Time 4    Period Weeks    Status On-going      OT SHORT TERM GOAL #4   Title Patient will improve right grip and pinch strength by 50% for improved ability to open jars and containers.    Time 4    Period Weeks    Status On-going      OT SHORT TERM GOAL #5   Title Patient will decrease pain to 3/10 or better in her RUE in order to complete ADLs with less difficulty.    Time 4    Period Weeks    Status On-going             OT Long Term Goals - 01/12/20 2119      OT LONG TERM GOAL #1   Title Patient will return to prior level of independence using right arm and hand as dominant with all desired B/IADLs, leisure activities.    Time 8    Period Weeks    Status On-going      OT LONG TERM GOAL #2   Title Patient will improve RUE A/ROM to WNL for improved ability to reach overhead, behind back and use hand to push self out of a chair.    Time 8    Period Weeks    Status On-going      OT LONG TERM GOAL #3   Title Patient will improve right upper extremity strength to 5/5 for improved ability to lift tools and bags of soil when gardening and pick up laundry baskets.    Time 8    Period Weeks    Status On-going      OT LONG TERM GOAL #4   Title Patient will improve right grip strength to 65 pounds and pinch strength to 15 pounds for improved ability to open containers and complete craft projects.    Time 8    Period Weeks    Status On-going      OT LONG TERM GOAL #5   Title Patient will decrease right upper extremity pain to 2/10  or better while completing ADLs, and leisure activities.    Time 8    Period Weeks    Status On-going      Long Term Additional Goals   Additional Long Term Goals Yes                  Plan - 02/07/20 1630    Clinical Impression Statement A: Pt reporting increased pain today and stating wall stretches were very difficulty. Recommend stopping wall stretches and continue with AAROM stretches. Continued manual therapy and pt reporting decreased soreness after manual therapy. Pt participating in stretches with therapy ball and AAROM using PVC for forward flexion at a diagnol. Providing moist heat for pain and muscle relaxation. Cues for form and technique throughout.    Body Structure / Function / Physical Skills ADL;Strength;Pain;UE functional use;ROM;IADL;Fascial restriction;Scar mobility;Wound;Coordination;FMC;Muscle spasms;Skin integrity;Decreased knowledge of precautions    Plan P: Continue working on activation required for flexion, AA/ROM with minimal trapezius activation    OT Home Exercise Plan inital evaluation:  table slides, tendon glides. 7/9 isometrics.    Consulted and Agree with Plan of Care Patient           Patient will benefit from skilled therapeutic intervention in order to improve the following deficits and impairments:   Body Structure / Function / Physical Skills: ADL, Strength, Pain, UE functional use, ROM, IADL, Fascial restriction, Scar mobility, Wound, Coordination, FMC, Muscle spasms, Skin integrity, Decreased knowledge of precautions       Visit Diagnosis: Stiffness of right shoulder, not elsewhere classified  Other symptoms and signs involving the musculoskeletal system  Acute pain of right shoulder  Stiffness of right wrist, not elsewhere classified  Pain in right wrist    Problem List Patient Active Problem List   Diagnosis Date Noted  . Traumatic tear of supraspinatus tendon of right shoulder 11/18/2019  . Tear of right infraspinatus tendon 11/18/2019  . Right carpal tunnel syndrome 11/04/2019  . Acute pain of right shoulder 11/04/2019  . Hypertension 04/12/2019  . Glucose intolerance 04/12/2019  . Chronic venous  insufficiency 04/06/2019  . Osteopenia 04/06/2019    Neal Dy, MSOT, OTR/L 02/07/2020, 4:34 PM  [place] [address] [place], [place], [address] Phone: [phone]   Fax:  [phone]  Name: RENITA BROCKS MRN: [id number] Date of Birth: 03-27-1956

## 2020-02-09 ENCOUNTER — Encounter (HOSPITAL_COMMUNITY): Payer: Self-pay | Admitting: Occupational Therapy

## 2020-02-09 ENCOUNTER — Ambulatory Visit (HOSPITAL_COMMUNITY): Payer: Medicare Other | Admitting: Occupational Therapy

## 2020-02-09 ENCOUNTER — Other Ambulatory Visit: Payer: Self-pay

## 2020-02-09 DIAGNOSIS — M25611 Stiffness of right shoulder, not elsewhere classified: Secondary | ICD-10-CM

## 2020-02-09 DIAGNOSIS — R29898 Other symptoms and signs involving the musculoskeletal system: Secondary | ICD-10-CM

## 2020-02-09 DIAGNOSIS — M25511 Pain in right shoulder: Secondary | ICD-10-CM

## 2020-02-09 DIAGNOSIS — M25531 Pain in right wrist: Secondary | ICD-10-CM

## 2020-02-09 DIAGNOSIS — M25631 Stiffness of right wrist, not elsewhere classified: Secondary | ICD-10-CM

## 2020-02-09 NOTE — Therapy (Signed)
[place], [place], [address] Phone: [phone]   Fax:  [phone]  Occupational Therapy Treatment  Patient Details  Name: Jessica [name] MRN: [id number] Date of Birth: 1956-02-21 Referring Provider (OT): Dr. Cheral Bay   Encounter Date: 02/09/2020   OT End of Session - 02/09/20 1112    Visit Number 15    Number of Visits 24    Date for OT Re-Evaluation 03/01/20    Authorization Type Medicare and BCBS    Progress Note Due on Visit 20    OT Start Time 1033    OT Stop Time 1112    OT Time Calculation (min) 39 min    Activity Tolerance Patient tolerated treatment well    Behavior During Therapy [facility] for tasks assessed/performed           Past Medical History:  Diagnosis Date  . Anemia    per patient, had it years ago before hysterectomy years ago  . Arthritis   . Asthma   . Bronchitis   . Carpal tunnel syndrome of right wrist   . Chronic venous insufficiency   . GERD (gastroesophageal reflux disease)   . History of hiatal hernia   . Hypertension   . Pneumonia   . PONV (postoperative nausea and vomiting)     Past Surgical History:  Procedure Laterality Date  . ABDOMINAL HYSTERECTOMY    . CARPAL TUNNEL RELEASE Right 12/16/2019   Procedure: RIGHT CARPAL TUNNEL RELEASE;  Surgeon: Meredith Pel, MD;  Location: [facility];  Service: Orthopedics;  Laterality: Right;  . COLONOSCOPY  06/05/2012   Procedure: COLONOSCOPY;  Surgeon: Rogene Houston, MD;  [facility];  Service: Endoscopy;  Laterality: N/A;  125-changed to 1225 Ann to notify pt  . NISSEN FUNDOPLICATION     x2  . SHOULDER ARTHROSCOPY WITH SUBACROMIAL DECOMPRESSION, ROTATOR CUFF REPAIR AND BICEP TENDON REPAIR Right 12/16/2019   Procedure: RIGHT SHOULDER ARTHROSCOPY, BICEPS TENODESIS, MINI OPEN ROTATOR CUFF TEAR REPAIR, DEBRIDEMENT;  Surgeon: Meredith Pel, MD;  Location: [facility];  Service: Orthopedics;  Laterality: Right;     There were no vitals filed for this visit.   Subjective Assessment - 02/09/20 1034    Subjective  S: My wrist isn't as aggravated as it was on Monday.    Currently in Pain? Yes    Pain Score 6     Pain Location Shoulder    Pain Orientation Right    Pain Descriptors / Indicators Aching;Sore    Pain Type Acute pain    Pain Radiating Towards N/A    Pain Onset More than a month ago    Pain Frequency Intermittent    Aggravating Factors  movement    Pain Relieving Factors rest    Effect of Pain on Daily Activities moderate effect on ADLs    Multiple Pain Sites No              [facility] OT Assessment - 02/09/20 1034      Assessment   Medical Diagnosis S/P R RCR, Biceps Tenodesis, and CTR      Precautions   Precautions Shoulder    Type of Shoulder Precautions passive and aa/rom of right shoulder, progress to a/rom on 7/1.  sling until 6/26.  wrist:  progress as tolerated       AROM   Right/Left Forearm Right    Right Forearm Pronation 90 Degrees   80 previous   Right Forearm  Supination 70 Degrees   same as previous-baseline   Right/Left Wrist Right    Right Wrist Extension 70 Degrees   64 previous   Right Wrist Flexion 75 Degrees   64 previous     Strength   Right Wrist Flexion 5/5    Right Wrist Extension 5/5    Right Wrist Radial Deviation 5/5    Right Wrist Ulnar Deviation 5/5      Hand Function   Right Hand Grip (lbs) 44   23 previous   Right Hand Lateral Pinch 15 lbs   10 previous   Right Hand 3 Point Pinch 8 lbs   same as previous                   OT Treatments/Exercises (OP) - 02/09/20 1035      Exercises   Exercises Shoulder;Hand      Shoulder Exercises: Seated   Protraction AAROM;15 reps    Horizontal ABduction AAROM;15 reps    External Rotation AAROM;15 reps    Internal Rotation AAROM;15 reps    Flexion AAROM;15 reps    Abduction AAROM;15 reps      Hand Exercises   Hand Gripper with Large Beads all beads gripper at 29#    Hand Gripper  with Medium Beads all beads gripper at 29#    Sponges pt using green clothespin to stack high resistance sponges using 3 point pinch. Pt stacking 4 towers of 5 sponges. Pt then placing all 20 sponges into bucket using lateral pinch and green clothespin.      Manual Therapy   Manual Therapy Myofascial release    Manual therapy comments manual therapy completed seperately from all other interventions this date    Myofascial Release myofascial release and manual stretching to decrease pain and fascial restrictions and improve pain free mobility in right shoulder, upper arm, scapular, and shoulder region, and wrist region.                      OT Short Term Goals - 02/09/20 1100      OT SHORT TERM GOAL #1   Title Patient will be educated and independent with HEP for RUE A/ROM and strength.    Time 4    Period Weeks    Status On-going    Target Date 02/02/20      OT SHORT TERM GOAL #2   Title Patient will demonstrate WFL P/ROM in right shoulder, forearm, wrist, and hand in order to improve independence with B/ADLs.    Time 4    Period Weeks    Status On-going      OT SHORT TERM GOAL #3   Title Patient will demonstrate 3+/5 or better strength in her right shoulder, forearm, wrist in order to assist with caring for her mother and complete gardening tasks.    Time 4    Period Weeks    Status On-going      OT SHORT TERM GOAL #4   Title Patient will improve right grip and pinch strength by 50% for improved ability to open jars and containers.    Time 4    Period Weeks    Status Achieved      OT SHORT TERM GOAL #5   Title Patient will decrease pain to 3/10 or better in her RUE in order to complete ADLs with less difficulty.    Time 4    Period Weeks    Status On-going  OT Long Term Goals - 02/09/20 1100      OT LONG TERM GOAL #1   Title Patient will return to prior level of independence using right arm and hand as dominant with all desired B/IADLs, leisure  activities.    Time 8    Period Weeks    Status On-going      OT LONG TERM GOAL #2   Title Patient will improve RUE A/ROM to WNL for improved ability to reach overhead, behind back and use hand to push self out of a chair.    Time 8    Period Weeks    Status On-going      OT LONG TERM GOAL #3   Title Patient will improve right upper extremity strength to 5/5 for improved ability to lift tools and bags of soil when gardening and pick up laundry baskets.    Time 8    Period Weeks    Status On-going      OT LONG TERM GOAL #4   Title Patient will improve right grip strength to 65 pounds and pinch strength to 15 pounds for improved ability to open containers and complete craft projects.    Time 8    Period Weeks    Status On-going      OT LONG TERM GOAL #5   Title Patient will decrease right upper extremity pain to 2/10 or better while completing ADLs, and leisure activities.    Time 8    Period Weeks    Status On-going                 Plan - 02/09/20 1047    Clinical Impression Statement A: Pt reports reaching forward is still the most difficult motion. Continued with manual techniques to left shoulder region to address fascial restrictions. Completed passive stretching and AA/ROM today. Wrist measurements taken for mini-reassessment and goal review. Pt has met STG for grip and pinch strength. Added hand gripper task and pinch activities today, pt with mod difficulty reaching forward during tasks due to bicep discomfort. Verbal cuing during session for form and technique.    Body Structure / Function / Physical Skills ADL;Strength;Pain;UE functional use;ROM;IADL;Fascial restriction;Scar mobility;Wound;Coordination;FMC;Muscle spasms;Skin integrity;Decreased knowledge of precautions    Plan P: Continue working on activation required for flexion, AA/ROM with minimal trapezius activation, grip and pinch tasks    OT Home Exercise Plan inital evaluation:  table slides, tendon glides.  7/9 isometrics.    Consulted and Agree with Plan of Care Patient           Patient will benefit from skilled therapeutic intervention in order to improve the following deficits and impairments:   Body Structure / Function / Physical Skills: ADL, Strength, Pain, UE functional use, ROM, IADL, Fascial restriction, Scar mobility, Wound, Coordination, FMC, Muscle spasms, Skin integrity, Decreased knowledge of precautions       Visit Diagnosis: Stiffness of right shoulder, not elsewhere classified  Other symptoms and signs involving the musculoskeletal system  Acute pain of right shoulder  Stiffness of right wrist, not elsewhere classified  Pain in right wrist    Problem List Patient Active Problem List   Diagnosis Date Noted  . Traumatic tear of supraspinatus tendon of right shoulder 11/18/2019  . Tear of right infraspinatus tendon 11/18/2019  . Right carpal tunnel syndrome 11/04/2019  . Acute pain of right shoulder 11/04/2019  . Hypertension 04/12/2019  . Glucose intolerance 04/12/2019  . Chronic venous insufficiency 04/06/2019  . Osteopenia 04/06/2019  Guadelupe Sabin, OTR/L  [id number] 02/09/2020, 11:13 AM  [place], [place], [address] Phone: [phone]   Fax:  [phone]  Name: Jessica [name] MRN: [id number] Date of Birth: September 04, 1955

## 2020-02-11 ENCOUNTER — Encounter (HOSPITAL_COMMUNITY): Payer: Self-pay | Admitting: Occupational Therapy

## 2020-02-11 ENCOUNTER — Other Ambulatory Visit: Payer: Self-pay

## 2020-02-11 ENCOUNTER — Encounter: Payer: Self-pay | Admitting: Orthopedic Surgery

## 2020-02-11 ENCOUNTER — Ambulatory Visit (HOSPITAL_COMMUNITY): Payer: Medicare Other | Admitting: Occupational Therapy

## 2020-02-11 DIAGNOSIS — R29898 Other symptoms and signs involving the musculoskeletal system: Secondary | ICD-10-CM

## 2020-02-11 DIAGNOSIS — M25511 Pain in right shoulder: Secondary | ICD-10-CM

## 2020-02-11 DIAGNOSIS — M25631 Stiffness of right wrist, not elsewhere classified: Secondary | ICD-10-CM

## 2020-02-11 DIAGNOSIS — M25611 Stiffness of right shoulder, not elsewhere classified: Secondary | ICD-10-CM

## 2020-02-11 NOTE — Progress Notes (Signed)
   Post-Op Visit Note   Patient: Jessica [name]           Date of Birth: [birth year]           MRN: [id number] Visit Date: 02/07/2020 PCP: Susy Frizzle, MD   Assessment & Plan:  Chief Complaint:  Chief Complaint  Patient presents with  . Right Shoulder - Follow-up    12/16/2019 Right shoulder arthroscopy, mini open rotator cuff repair, biceps tenodesis   Visit Diagnoses:  1. Traumatic tear of supraspinatus tendon of right shoulder, initial encounter     Plan: Jessica [name] is a patient is now about 2 months out right shoulder arthroscopy with biceps tenodesis mini open rotator cuff repair and debridement.  She is making progress.  On exam she does have a little bit of stiffness.  Plan is continue with therapy for range of motion and some strengthening.  4-week return for clinical recheck.  I do want her to focus more on stretching at this time.  No coarse grinding with passive range of motion at this time.  Follow-Up Instructions: Return in about 4 weeks (around 03/06/2020).   Orders:  No orders of the defined types were placed in this encounter.  No orders of the defined types were placed in this encounter.   Imaging: No results found.  PMFS History: Patient Active Problem List   Diagnosis Date Noted  . Traumatic tear of supraspinatus tendon of right shoulder 11/18/2019  . Tear of right infraspinatus tendon 11/18/2019  . Right carpal tunnel syndrome 11/04/2019  . Acute pain of right shoulder 11/04/2019  . Hypertension 04/12/2019  . Glucose intolerance 04/12/2019  . Chronic venous insufficiency 04/06/2019  . Osteopenia 04/06/2019   Past Medical History:  Diagnosis Date  . Anemia    per patient, had it years ago before hysterectomy years ago  . Arthritis   . Asthma   . Bronchitis   . Carpal tunnel syndrome of right wrist   . Chronic venous insufficiency   . GERD (gastroesophageal reflux disease)   . History of hiatal hernia   . Hypertension   . Pneumonia   . PONV  (postoperative nausea and vomiting)     History reviewed. No pertinent family history.  Past Surgical History:  Procedure Laterality Date  . ABDOMINAL HYSTERECTOMY    . CARPAL TUNNEL RELEASE Right 12/16/2019   Procedure: RIGHT CARPAL TUNNEL RELEASE;  Surgeon: Meredith Pel, MD;  Location: [facility];  Service: Orthopedics;  Laterality: Right;  . COLONOSCOPY  06/05/2012   Procedure: COLONOSCOPY;  Surgeon: Rogene Houston, MD;  [facility];  Service: Endoscopy;  Laterality: N/A;  125-changed to 1225 Ann to notify pt  . NISSEN FUNDOPLICATION     x2  . SHOULDER ARTHROSCOPY WITH SUBACROMIAL DECOMPRESSION, ROTATOR CUFF REPAIR AND BICEP TENDON REPAIR Right 12/16/2019   Procedure: RIGHT SHOULDER ARTHROSCOPY, BICEPS TENODESIS, MINI OPEN ROTATOR CUFF TEAR REPAIR, DEBRIDEMENT;  Surgeon: Meredith Pel, MD;  Location: [facility];  Service: Orthopedics;  Laterality: Right;   Social History   Occupational History  . Not on file  Tobacco Use  . Smoking status: Never Smoker  . Smokeless tobacco: Never Used  Vaping Use  . Vaping Use: Never used  Substance and Sexual Activity  . Alcohol use: No  . Drug use: No  . Sexual activity: Not on file

## 2020-02-11 NOTE — Therapy (Signed)
[place], [place], [address] Phone: [phone]   Fax:  [phone]  Occupational Therapy Treatment  Patient Details  Name: Jessica [name] MRN: [id number] Date of Birth: August 15, 1955 Referring Provider (OT): Dr. Cheral Bay   Encounter Date: 02/11/2020   OT End of Session - 02/11/20 1153    Visit Number 16    Number of Visits 24    Date for OT Re-Evaluation 03/01/20    Authorization Type Medicare and BCBS    Progress Note Due on Visit 20    OT Start Time 1121    OT Stop Time 1200    OT Time Calculation (min) 39 min    Activity Tolerance Patient tolerated treatment well    Behavior During Therapy [facility] for tasks assessed/performed           Past Medical History:  Diagnosis Date  . Anemia    per patient, had it years ago before hysterectomy years ago  . Arthritis   . Asthma   . Bronchitis   . Carpal tunnel syndrome of right wrist   . Chronic venous insufficiency   . GERD (gastroesophageal reflux disease)   . History of hiatal hernia   . Hypertension   . Pneumonia   . PONV (postoperative nausea and vomiting)     Past Surgical History:  Procedure Laterality Date  . ABDOMINAL HYSTERECTOMY    . CARPAL TUNNEL RELEASE Right 12/16/2019   Procedure: RIGHT CARPAL TUNNEL RELEASE;  Surgeon: Meredith Pel, MD;  Location: [facility];  Service: Orthopedics;  Laterality: Right;  . COLONOSCOPY  06/05/2012   Procedure: COLONOSCOPY;  Surgeon: Rogene Houston, MD;  [facility];  Service: Endoscopy;  Laterality: N/A;  125-changed to 1225 Ann to notify pt  . NISSEN FUNDOPLICATION     x2  . SHOULDER ARTHROSCOPY WITH SUBACROMIAL DECOMPRESSION, ROTATOR CUFF REPAIR AND BICEP TENDON REPAIR Right 12/16/2019   Procedure: RIGHT SHOULDER ARTHROSCOPY, BICEPS TENODESIS, MINI OPEN ROTATOR CUFF TEAR REPAIR, DEBRIDEMENT;  Surgeon: Meredith Pel, MD;  Location: [place];  Service: Orthopedics;  Laterality: Right;     There were no vitals filed for this visit.   Subjective Assessment - 02/11/20 1122    Subjective  S: I had to take a muscle relaxer after my exercises last night.    Currently in Pain? Yes    Pain Score 6     Pain Location Shoulder    Pain Orientation Right    Pain Descriptors / Indicators Aching;Sore    Pain Type Acute pain    Pain Radiating Towards N/A    Pain Onset More than a month ago    Pain Frequency Intermittent    Aggravating Factors  movement    Pain Relieving Factors rest    Effect of Pain on Daily Activities mod effect on ADLs    Multiple Pain Sites No              [facility] OT Assessment - 02/11/20 1122      Assessment   Medical Diagnosis S/P R RCR, Biceps Tenodesis, and CTR      Precautions   Precautions Shoulder    Type of Shoulder Precautions passive and aa/rom of right shoulder, progress to a/rom on 7/1.  sling until 6/26.  wrist:  progress as tolerated                     OT Treatments/Exercises (OP) - 02/11/20 1123  Exercises   Exercises Shoulder;Hand      Shoulder Exercises: Supine   Protraction PROM;5 reps;AAROM;10 reps    Horizontal ABduction PROM;5 reps;AAROM;10 reps    External Rotation PROM;5 reps;AROM;10 reps    Internal Rotation PROM;5 reps;AROM;10 reps    Flexion PROM;5 reps;AAROM;10 reps    ABduction PROM;5 reps;AAROM;10 reps      Shoulder Exercises: Seated   Protraction AAROM;10 reps    Horizontal ABduction AAROM;10 reps    External Rotation AAROM;10 reps    Internal Rotation AAROM;10 reps    Flexion AAROM;10 reps    Abduction AAROM;10 reps      Shoulder Exercises: ROM/Strengthening   Wall Wash 1' chest to head height      Hand Exercises   Hand Gripper with Large Beads all beads gripper at 29#    Hand Gripper with Medium Beads all beads gripper at 29#      Functional Reaching Activities   High Level Pt completing pinch tree while seated at table, able to place 7 yellow pins on vertical bar, then placed  remainder of pins on top horizontal bar working on forward reach      Manual Therapy   Manual Therapy Myofascial release    Manual therapy comments manual therapy completed seperately from all other interventions this date    Myofascial Release myofascial release and manual stretching to decrease pain and fascial restrictions and improve pain free mobility in right shoulder, upper arm, scapular, and shoulder region, and wrist region.                      OT Short Term Goals - 02/09/20 1100      OT SHORT TERM GOAL #1   Title Patient will be educated and independent with HEP for RUE A/ROM and strength.    Time 4    Period Weeks    Status On-going    Target Date 02/02/20      OT SHORT TERM GOAL #2   Title Patient will demonstrate WFL P/ROM in right shoulder, forearm, wrist, and hand in order to improve independence with B/ADLs.    Time 4    Period Weeks    Status On-going      OT SHORT TERM GOAL #3   Title Patient will demonstrate 3+/5 or better strength in her right shoulder, forearm, wrist in order to assist with caring for her mother and complete gardening tasks.    Time 4    Period Weeks    Status On-going      OT SHORT TERM GOAL #4   Title Patient will improve right grip and pinch strength by 50% for improved ability to open jars and containers.    Time 4    Period Weeks    Status Achieved      OT SHORT TERM GOAL #5   Title Patient will decrease pain to 3/10 or better in her RUE in order to complete ADLs with less difficulty.    Time 4    Period Weeks    Status On-going             OT Long Term Goals - 02/09/20 1100      OT LONG TERM GOAL #1   Title Patient will return to prior level of independence using right arm and hand as dominant with all desired B/IADLs, leisure activities.    Time 8    Period Weeks    Status On-going      OT  LONG TERM GOAL #2   Title Patient will improve RUE A/ROM to WNL for improved ability to reach overhead, behind back  and use hand to push self out of a chair.    Time 8    Period Weeks    Status On-going      OT LONG TERM GOAL #3   Title Patient will improve right upper extremity strength to 5/5 for improved ability to lift tools and bags of soil when gardening and pick up laundry baskets.    Time 8    Period Weeks    Status On-going      OT LONG TERM GOAL #4   Title Patient will improve right grip strength to 65 pounds and pinch strength to 15 pounds for improved ability to open containers and complete craft projects.    Time 8    Period Weeks    Status On-going      OT LONG TERM GOAL #5   Title Patient will decrease right upper extremity pain to 2/10 or better while completing ADLs, and leisure activities.    Time 8    Period Weeks    Status On-going                 Plan - 02/11/20 1150    Clinical Impression Statement A: Pt reports increased soreness after exercises last night. Continued with manual techniques to left shoulder region to address fascial restrictions. Continued with AA/ROM in supine and standing today, added functional reaching task as well. Pt with significant trapezius activation and shoulder hiking during functional reaching and seated AA/ROM. Increased pain at end of session, rest breaks provided as needed. Verbal cuing for form and technique.    Body Structure / Function / Physical Skills ADL;Strength;Pain;UE functional use;ROM;IADL;Fascial restriction;Scar mobility;Wound;Coordination;FMC;Muscle spasms;Skin integrity;Decreased knowledge of precautions    Plan P: Continue working on activation required for flexion, AA/ROM with minimal trapezius activation, grip and pinch tasks    OT Home Exercise Plan inital evaluation:  table slides, tendon glides. 7/9 isometrics.    Consulted and Agree with Plan of Care Patient           Patient will benefit from skilled therapeutic intervention in order to improve the following deficits and impairments:   Body Structure /  Function / Physical Skills: ADL, Strength, Pain, UE functional use, ROM, IADL, Fascial restriction, Scar mobility, Wound, Coordination, FMC, Muscle spasms, Skin integrity, Decreased knowledge of precautions       Visit Diagnosis: Stiffness of right shoulder, not elsewhere classified  Other symptoms and signs involving the musculoskeletal system  Acute pain of right shoulder  Stiffness of right wrist, not elsewhere classified    Problem List Patient Active Problem List   Diagnosis Date Noted  . Traumatic tear of supraspinatus tendon of right shoulder 11/18/2019  . Tear of right infraspinatus tendon 11/18/2019  . Right carpal tunnel syndrome 11/04/2019  . Acute pain of right shoulder 11/04/2019  . Hypertension 04/12/2019  . Glucose intolerance 04/12/2019  . Chronic venous insufficiency 04/06/2019  . Osteopenia 04/06/2019    Guadelupe Sabin, OTR/L  [phone] 02/11/2020, 12:06 PM  [facility] [address] [place], [place], [address] Phone: [phone]   Fax:  [phone]  Name: UNIQUA KIHN MRN: [id number] Date of Birth: [birth year]

## 2020-02-14 ENCOUNTER — Other Ambulatory Visit: Payer: Self-pay

## 2020-02-14 ENCOUNTER — Ambulatory Visit (HOSPITAL_COMMUNITY): Payer: Medicare Other | Attending: Orthopedic Surgery | Admitting: Occupational Therapy

## 2020-02-14 ENCOUNTER — Encounter (HOSPITAL_COMMUNITY): Payer: Self-pay | Admitting: Occupational Therapy

## 2020-02-14 DIAGNOSIS — M25631 Stiffness of right wrist, not elsewhere classified: Secondary | ICD-10-CM | POA: Insufficient documentation

## 2020-02-14 DIAGNOSIS — R29898 Other symptoms and signs involving the musculoskeletal system: Secondary | ICD-10-CM | POA: Diagnosis present

## 2020-02-14 DIAGNOSIS — M25511 Pain in right shoulder: Secondary | ICD-10-CM | POA: Diagnosis present

## 2020-02-14 DIAGNOSIS — M25611 Stiffness of right shoulder, not elsewhere classified: Secondary | ICD-10-CM | POA: Diagnosis not present

## 2020-02-14 DIAGNOSIS — M25531 Pain in right wrist: Secondary | ICD-10-CM | POA: Diagnosis present

## 2020-02-14 NOTE — Therapy (Signed)
[place], [place], [address] Phone: [phone]   Fax:  [phone]  Occupational Therapy Treatment  Patient Details  Name: Jessica [name] MRN: [id number] Date of Birth: [birth year] Referring Provider (OT): Dr. Cheral Bay   Encounter Date: 02/14/2020   OT End of Session - 02/14/20 1121    Visit Number 17    Number of Visits 24    Date for OT Re-Evaluation 03/01/20    Authorization Type Medicare and BCBS    Progress Note Due on Visit 20    OT Start Time 1116    OT Stop Time 1159    OT Time Calculation (min) 43 min    Activity Tolerance Patient tolerated treatment well    Behavior During Therapy [facility] for tasks assessed/performed           Past Medical History:  Diagnosis Date  . Anemia    per patient, had it years ago before hysterectomy years ago  . Arthritis   . Asthma   . Bronchitis   . Carpal tunnel syndrome of right wrist   . Chronic venous insufficiency   . GERD (gastroesophageal reflux disease)   . History of hiatal hernia   . Hypertension   . Pneumonia   . PONV (postoperative nausea and vomiting)     Past Surgical History:  Procedure Laterality Date  . ABDOMINAL HYSTERECTOMY    . CARPAL TUNNEL RELEASE Right 12/16/2019   Procedure: RIGHT CARPAL TUNNEL RELEASE;  Surgeon: Meredith Pel, MD;  Location: [facility];  Service: Orthopedics;  Laterality: Right;  . COLONOSCOPY  06/05/2012   Procedure: COLONOSCOPY;  Surgeon: Rogene Houston, MD;  [facility];  Service: Endoscopy;  Laterality: N/A;  125-changed to 1225 Ann to notify pt  . NISSEN FUNDOPLICATION     x2  . SHOULDER ARTHROSCOPY WITH SUBACROMIAL DECOMPRESSION, ROTATOR CUFF REPAIR AND BICEP TENDON REPAIR Right 12/16/2019   Procedure: RIGHT SHOULDER ARTHROSCOPY, BICEPS TENODESIS, MINI OPEN ROTATOR CUFF TEAR REPAIR, DEBRIDEMENT;  Surgeon: Meredith Pel, MD;  Location: [facility];  Service: Orthopedics;  Laterality: Right;     There were no vitals filed for this visit.   Subjective Assessment - 02/14/20 1119    Subjective  S: I shucked and silked corn this morning    Currently in Pain? Yes    Pain Score 3     Pain Location Shoulder              [facility] OT Assessment - 02/14/20 1146      Assessment   Medical Diagnosis S/P R RCR, Biceps Tenodesis, and CTR      Precautions   Precautions Shoulder    Type of Shoulder Precautions passive and aa/rom of right shoulder, progress to a/rom on 7/1.  sling until 6/26.  wrist:  progress as tolerated                     OT Treatments/Exercises (OP) - 02/14/20 1135      Exercises   Exercises Shoulder;Hand      Shoulder Exercises: Supine   Protraction AAROM;10 reps    Horizontal ABduction PROM;5 reps;AAROM;10 reps    External Rotation PROM;5 reps;AROM;10 reps    Internal Rotation PROM;5 reps;AROM;10 reps    Flexion PROM;5 reps;AAROM;10 reps    ABduction PROM;5 reps;AAROM;10 reps      Shoulder Exercises: Seated   Row AAROM;10 reps    Horizontal ABduction AAROM;10 reps  External Rotation AAROM;10 reps    Internal Rotation AAROM;10 reps    Flexion AAROM;10 reps    Abduction AAROM;10 reps      Shoulder Exercises: ROM/Strengthening   Wall Wash 1' chest to head height    Other ROM/Strengthening Exercises PVC sliding at diagonal for forward flexion and turning; 10x      Moist Heat Therapy   Number Minutes Moist Heat 3 Minutes    Moist Heat Location Shoulder      Manual Therapy   Manual Therapy Myofascial release    Manual therapy comments manual therapy completed seperately from all other interventions this date    Myofascial Release myofascial release and manual stretching to decrease pain and fascial restrictions and improve pain free mobility in right shoulder, upper arm, scapular, and shoulder region, and wrist region.                      OT Short Term Goals - 02/09/20 1100      OT SHORT TERM GOAL #1   Title Patient  will be educated and independent with HEP for RUE A/ROM and strength.    Time 4    Period Weeks    Status On-going    Target Date 02/02/20      OT SHORT TERM GOAL #2   Title Patient will demonstrate WFL P/ROM in right shoulder, forearm, wrist, and hand in order to improve independence with B/ADLs.    Time 4    Period Weeks    Status On-going      OT SHORT TERM GOAL #3   Title Patient will demonstrate 3+/5 or better strength in her right shoulder, forearm, wrist in order to assist with caring for her mother and complete gardening tasks.    Time 4    Period Weeks    Status On-going      OT SHORT TERM GOAL #4   Title Patient will improve right grip and pinch strength by 50% for improved ability to open jars and containers.    Time 4    Period Weeks    Status Achieved      OT SHORT TERM GOAL #5   Title Patient will decrease pain to 3/10 or better in her RUE in order to complete ADLs with less difficulty.    Time 4    Period Weeks    Status On-going             OT Long Term Goals - 02/09/20 1100      OT LONG TERM GOAL #1   Title Patient will return to prior level of independence using right arm and hand as dominant with all desired B/IADLs, leisure activities.    Time 8    Period Weeks    Status On-going      OT LONG TERM GOAL #2   Title Patient will improve RUE A/ROM to WNL for improved ability to reach overhead, behind back and use hand to push self out of a chair.    Time 8    Period Weeks    Status On-going      OT LONG TERM GOAL #3   Title Patient will improve right upper extremity strength to 5/5 for improved ability to lift tools and bags of soil when gardening and pick up laundry baskets.    Time 8    Period Weeks    Status On-going      OT LONG TERM GOAL #4   Title Patient  will improve right grip strength to 65 pounds and pinch strength to 15 pounds for improved ability to open containers and complete craft projects.    Time 8    Period Weeks    Status  On-going      OT LONG TERM GOAL #5   Title Patient will decrease right upper extremity pain to 2/10 or better while completing ADLs, and leisure activities.    Time 8    Period Weeks    Status On-going                 Plan - 02/14/20 1243    Clinical Impression Statement A: Pt reporting she has been more tight than usual. Continued with manual techniques to left shoulder region to address fascial restrictions. Continued with AA/ROM in supine and standing today. Wall wash exercise and forward flexion with use of PVC at a diagonal. Pt with significant trapezius activation and shoulder hiking during functional reaching and seated AA/ROM. Providing moist heat for pain and tension. Providing verbal and tactile cuing for form and technique.    Body Structure / Function / Physical Skills ADL;Strength;Pain;UE functional use;ROM;IADL;Fascial restriction;Scar mobility;Wound;Coordination;FMC;Muscle spasms;Skin integrity;Decreased knowledge of precautions    Plan P: Continue working on activation required for flexion, AA/ROM with minimal trapezius activation, grip and pinch tasks    OT Home Exercise Plan inital evaluation:  table slides, tendon glides. 7/9 isometrics.    Consulted and Agree with Plan of Care Patient           Patient will benefit from skilled therapeutic intervention in order to improve the following deficits and impairments:   Body Structure / Function / Physical Skills: ADL, Strength, Pain, UE functional use, ROM, IADL, Fascial restriction, Scar mobility, Wound, Coordination, FMC, Muscle spasms, Skin integrity, Decreased knowledge of precautions       Visit Diagnosis: Stiffness of right shoulder, not elsewhere classified  Other symptoms and signs involving the musculoskeletal system  Acute pain of right shoulder  Stiffness of right wrist, not elsewhere classified  Pain in right wrist    Problem List Patient Active Problem List   Diagnosis Date Noted  .  Traumatic tear of supraspinatus tendon of right shoulder 11/18/2019  . Tear of right infraspinatus tendon 11/18/2019  . Right carpal tunnel syndrome 11/04/2019  . Acute pain of right shoulder 11/04/2019  . Hypertension 04/12/2019  . Glucose intolerance 04/12/2019  . Chronic venous insufficiency 04/06/2019  . Osteopenia 04/06/2019    Neal Dy, MSOT, OTR/L 02/14/2020, 12:47 PM  [facility] [address] [place], [place], [address] Phone: [phone]   Fax:  [phone]  Name: Jessica [name] MRN: [id number] Date of Birth: Feb 16, 1956

## 2020-02-16 ENCOUNTER — Encounter (HOSPITAL_COMMUNITY): Payer: Self-pay | Admitting: Occupational Therapy

## 2020-02-16 ENCOUNTER — Ambulatory Visit (HOSPITAL_COMMUNITY): Payer: Medicare Other | Admitting: Occupational Therapy

## 2020-02-16 ENCOUNTER — Other Ambulatory Visit: Payer: Self-pay

## 2020-02-16 DIAGNOSIS — M25611 Stiffness of right shoulder, not elsewhere classified: Secondary | ICD-10-CM

## 2020-02-16 DIAGNOSIS — R29898 Other symptoms and signs involving the musculoskeletal system: Secondary | ICD-10-CM

## 2020-02-16 DIAGNOSIS — M25511 Pain in right shoulder: Secondary | ICD-10-CM

## 2020-02-16 DIAGNOSIS — M25631 Stiffness of right wrist, not elsewhere classified: Secondary | ICD-10-CM

## 2020-02-16 DIAGNOSIS — M25531 Pain in right wrist: Secondary | ICD-10-CM

## 2020-02-16 NOTE — Therapy (Signed)
[place], [place], [address] Phone: [phone]   Fax:  [phone]  Occupational Therapy Treatment  Patient Details  Name: Jessica [name] MRN: [id number] Date of Birth: Mar 26, 1956 Referring Provider (OT): Dr. Cheral Bay   Encounter Date: 02/16/2020   OT End of Session - 02/16/20 1113    Visit Number 18    Number of Visits 24    Date for OT Re-Evaluation 03/01/20    Authorization Type Medicare and BCBS    Progress Note Due on Visit 20    OT Start Time 1032    OT Stop Time 1112    OT Time Calculation (min) 40 min    Activity Tolerance Patient tolerated treatment well    Behavior During Therapy [facility] for tasks assessed/performed           Past Medical History:  Diagnosis Date  . Anemia    per patient, had it years ago before hysterectomy years ago  . Arthritis   . Asthma   . Bronchitis   . Carpal tunnel syndrome of right wrist   . Chronic venous insufficiency   . GERD (gastroesophageal reflux disease)   . History of hiatal hernia   . Hypertension   . Pneumonia   . PONV (postoperative nausea and vomiting)     Past Surgical History:  Procedure Laterality Date  . ABDOMINAL HYSTERECTOMY    . CARPAL TUNNEL RELEASE Right 12/16/2019   Procedure: RIGHT CARPAL TUNNEL RELEASE;  Surgeon: Meredith Pel, MD;  Location: [place];  Service: Orthopedics;  Laterality: Right;  . COLONOSCOPY  06/05/2012   Procedure: COLONOSCOPY;  Surgeon: Rogene Houston, MD;  [facility];  Service: Endoscopy;  Laterality: N/A;  125-changed to 1225 Ann to notify pt  . NISSEN FUNDOPLICATION     x2  . SHOULDER ARTHROSCOPY WITH SUBACROMIAL DECOMPRESSION, ROTATOR CUFF REPAIR AND BICEP TENDON REPAIR Right 12/16/2019   Procedure: RIGHT SHOULDER ARTHROSCOPY, BICEPS TENODESIS, MINI OPEN ROTATOR CUFF TEAR REPAIR, DEBRIDEMENT;  Surgeon: Meredith Pel, MD;  Location: [place];  Service: Orthopedics;  Laterality: Right;     There were no vitals filed for this visit.   Subjective Assessment - 02/16/20 1029    Subjective  S: I'm a little bit tight but nothing major.    Currently in Pain? No/denies              [facility] OT Assessment - 02/16/20 1029      Assessment   Medical Diagnosis S/P R RCR, Biceps Tenodesis, and CTR      Precautions   Precautions Shoulder    Type of Shoulder Precautions passive and aa/rom of right shoulder, progress to a/rom on 7/1.  sling until 6/26.  wrist:  progress as tolerated                     OT Treatments/Exercises (OP) - 02/16/20 1032      Exercises   Exercises Shoulder;Hand      Shoulder Exercises: Supine   Protraction PROM;5 reps;AAROM;10 reps    Horizontal ABduction PROM;5 reps;AAROM;10 reps    External Rotation PROM;5 reps;AROM;10 reps    Internal Rotation PROM;5 reps;AROM;10 reps    Flexion PROM;5 reps;AAROM;10 reps    ABduction PROM;5 reps;AAROM;10 reps      Shoulder Exercises: Seated   Protraction AAROM;10 reps    Horizontal ABduction AAROM;10 reps    External Rotation AROM;10 reps    Internal Rotation AROM;10  reps    Flexion AAROM;10 reps    Abduction AAROM;10 reps      Shoulder Exercises: ROM/Strengthening   Wall Wash 1'    Other ROM/Strengthening Exercises PVC Pipe slide, flexion, 10X      Functional Reaching Activities   Low Level Pt stacking 5 cones at waist height x2 trials.     Mid Level Pt placed 5 cones on bottom shelf of cabinet in flexion, removed in abduction      Manual Therapy   Manual Therapy Myofascial release    Manual therapy comments manual therapy completed seperately from all other interventions this date    Myofascial Release myofascial release and manual stretching to decrease pain and fascial restrictions and improve pain free mobility in right shoulder, upper arm, scapular, and shoulder region, and wrist region.                      OT Short Term Goals - 02/09/20 1100      OT SHORT TERM GOAL  #1   Title Patient will be educated and independent with HEP for RUE A/ROM and strength.    Time 4    Period Weeks    Status On-going    Target Date 02/02/20      OT SHORT TERM GOAL #2   Title Patient will demonstrate WFL P/ROM in right shoulder, forearm, wrist, and hand in order to improve independence with B/ADLs.    Time 4    Period Weeks    Status On-going      OT SHORT TERM GOAL #3   Title Patient will demonstrate 3+/5 or better strength in her right shoulder, forearm, wrist in order to assist with caring for her mother and complete gardening tasks.    Time 4    Period Weeks    Status On-going      OT SHORT TERM GOAL #4   Title Patient will improve right grip and pinch strength by 50% for improved ability to open jars and containers.    Time 4    Period Weeks    Status Achieved      OT SHORT TERM GOAL #5   Title Patient will decrease pain to 3/10 or better in her RUE in order to complete ADLs with less difficulty.    Time 4    Period Weeks    Status On-going             OT Long Term Goals - 02/09/20 1100      OT LONG TERM GOAL #1   Title Patient will return to prior level of independence using right arm and hand as dominant with all desired B/IADLs, leisure activities.    Time 8    Period Weeks    Status On-going      OT LONG TERM GOAL #2   Title Patient will improve RUE A/ROM to WNL for improved ability to reach overhead, behind back and use hand to push self out of a chair.    Time 8    Period Weeks    Status On-going      OT LONG TERM GOAL #3   Title Patient will improve right upper extremity strength to 5/5 for improved ability to lift tools and bags of soil when gardening and pick up laundry baskets.    Time 8    Period Weeks    Status On-going      OT LONG TERM GOAL #4   Title Patient will  improve right grip strength to 65 pounds and pinch strength to 15 pounds for improved ability to open containers and complete craft projects.    Time 8     Period Weeks    Status On-going      OT LONG TERM GOAL #5   Title Patient will decrease right upper extremity pain to 2/10 or better while completing ADLs, and leisure activities.    Time 8    Period Weeks    Status On-going                 Plan - 02/16/20 1053    Clinical Impression Statement A: Continued with manual techniques to address fascial restrictions in upper arm and trapezius. Attempted A/ROM in supine however pt unable to reach end range, therefore continued with AA/ROM. Continued with PVC pipe slide and wall wash, pt demonstrating improvement in muscle activation today, able to reach to full ROM during both tasks. Added functional reaching at low level, with focus on forward flexion with minimal trapezius activation. Verbal cuing for form and technique.    Body Structure / Function / Physical Skills ADL;Strength;Pain;UE functional use;ROM;IADL;Fascial restriction;Scar mobility;Wound;Coordination;FMC;Muscle spasms;Skin integrity;Decreased knowledge of precautions    Plan P: Continue working on activation required for flexion, AA/ROM with minimal trapezius activation, grip and pinch tasks. Continue with functional reaching tasks.    OT Home Exercise Plan inital evaluation:  table slides, tendon glides. 7/9 isometrics.    Consulted and Agree with Plan of Care Patient           Patient will benefit from skilled therapeutic intervention in order to improve the following deficits and impairments:   Body Structure / Function / Physical Skills: ADL, Strength, Pain, UE functional use, ROM, IADL, Fascial restriction, Scar mobility, Wound, Coordination, FMC, Muscle spasms, Skin integrity, Decreased knowledge of precautions       Visit Diagnosis: Stiffness of right shoulder, not elsewhere classified  Other symptoms and signs involving the musculoskeletal system  Acute pain of right shoulder  Stiffness of right wrist, not elsewhere classified  Pain in right  wrist    Problem List Patient Active Problem List   Diagnosis Date Noted  . Traumatic tear of supraspinatus tendon of right shoulder 11/18/2019  . Tear of right infraspinatus tendon 11/18/2019  . Right carpal tunnel syndrome 11/04/2019  . Acute pain of right shoulder 11/04/2019  . Hypertension 04/12/2019  . Glucose intolerance 04/12/2019  . Chronic venous insufficiency 04/06/2019  . Osteopenia 04/06/2019   Guadelupe Sabin, OTR/L  [phone] 02/16/2020, 11:15 AM  [place] [address] [place], [place], [address] Phone: [phone]   Fax:  [phone]  Name: Jessica [name] MRN: [id number] Date of Birth: September 23, 1955

## 2020-02-18 ENCOUNTER — Other Ambulatory Visit: Payer: Self-pay

## 2020-02-18 ENCOUNTER — Encounter (HOSPITAL_COMMUNITY): Payer: Self-pay | Admitting: Occupational Therapy

## 2020-02-18 ENCOUNTER — Ambulatory Visit (HOSPITAL_COMMUNITY): Payer: Medicare Other | Admitting: Occupational Therapy

## 2020-02-18 DIAGNOSIS — M25611 Stiffness of right shoulder, not elsewhere classified: Secondary | ICD-10-CM | POA: Diagnosis not present

## 2020-02-18 DIAGNOSIS — R29898 Other symptoms and signs involving the musculoskeletal system: Secondary | ICD-10-CM

## 2020-02-18 NOTE — Therapy (Signed)
[place], [place], [address] Phone: [phone]   Fax:  [phone]  Occupational Therapy Treatment  Patient Details  Name: Jessica [name] MRN: [id number] Date of Birth: [birth year] Referring Provider (OT): Dr. Cheral Bay   Encounter Date: 02/18/2020   OT End of Session - 02/18/20 1259    Visit Number 19    Number of Visits 24    Date for OT Re-Evaluation 03/01/20    Authorization Type Medicare and BCBS    Progress Note Due on Visit 20    OT Start Time 1123    OT Stop Time 1203    OT Time Calculation (min) 40 min    Activity Tolerance Patient tolerated treatment well    Behavior During Therapy [facility] for tasks assessed/performed           Past Medical History:  Diagnosis Date  . Anemia    per patient, had it years ago before hysterectomy years ago  . Arthritis   . Asthma   . Bronchitis   . Carpal tunnel syndrome of right wrist   . Chronic venous insufficiency   . GERD (gastroesophageal reflux disease)   . History of hiatal hernia   . Hypertension   . Pneumonia   . PONV (postoperative nausea and vomiting)     Past Surgical History:  Procedure Laterality Date  . ABDOMINAL HYSTERECTOMY    . CARPAL TUNNEL RELEASE Right 12/16/2019   Procedure: RIGHT CARPAL TUNNEL RELEASE;  Surgeon: Meredith Pel, MD;  Location: [place];  Service: Orthopedics;  Laterality: Right;  . COLONOSCOPY  06/05/2012   Procedure: COLONOSCOPY;  Surgeon: Rogene Houston, MD;  [facility];  Service: Endoscopy;  Laterality: N/A;  125-changed to 1225 Ann to notify pt  . NISSEN FUNDOPLICATION     x2  . SHOULDER ARTHROSCOPY WITH SUBACROMIAL DECOMPRESSION, ROTATOR CUFF REPAIR AND BICEP TENDON REPAIR Right 12/16/2019   Procedure: RIGHT SHOULDER ARTHROSCOPY, BICEPS TENODESIS, MINI OPEN ROTATOR CUFF TEAR REPAIR, DEBRIDEMENT;  Surgeon: Meredith Pel, MD;  Location: [place];  Service: Orthopedics;  Laterality: Right;     There were no vitals filed for this visit.   Subjective Assessment - 02/18/20 1124    Subjective  S: My shoulder feels pretty good but my bicep is tight    Currently in Pain? Yes    Pain Score 3     Pain Location Shoulder    Pain Orientation Right    Pain Descriptors / Indicators Tightness    Pain Type Acute pain    Pain Radiating Towards N/A    Pain Onset More than a month ago    Pain Frequency Intermittent    Aggravating Factors  Movement    Pain Relieving Factors Rest    Effect of Pain on Daily Activities mod effect on ADLs    Multiple Pain Sites No                        OT Treatments/Exercises (OP) - 02/18/20 1128      Exercises   Exercises Shoulder;Hand      Shoulder Exercises: Supine   Protraction PROM;5 reps;AAROM;10 reps    Horizontal ABduction PROM;5 reps;AAROM;10 reps    External Rotation PROM;5 reps;AROM;10 reps    Internal Rotation PROM;5 reps;AROM;10 reps    Flexion PROM;5 reps;AAROM;10 reps    ABduction PROM;5 reps;AAROM;10 reps      Shoulder Exercises: Seated   Protraction  AAROM;10 reps    Horizontal ABduction AAROM;10 reps    External Rotation AROM;10 reps    Internal Rotation AROM;10 reps    Flexion AAROM;10 reps    Abduction AAROM;10 reps      Shoulder Exercises: ROM/Strengthening   Wall Wash 1'    Other ROM/Strengthening Exercises PVC Pipe slide, flexion, 10X      Functional Reaching Activities   Mid Level Pt stacking 5 cones at lower level, forward flexion      Manual Therapy   Manual Therapy Myofascial release    Manual therapy comments manual therapy completed seperately from all other interventions this date    Myofascial Release myofascial release and manual stretching to decrease pain and fascial restrictions and improve pain free mobility in right shoulder, upper arm, scapular, and shoulder region, and wrist region.                      OT Short Term Goals - 02/09/20 1100      OT SHORT TERM GOAL #1    Title Patient will be educated and independent with HEP for RUE A/ROM and strength.    Time 4    Period Weeks    Status On-going    Target Date 02/02/20      OT SHORT TERM GOAL #2   Title Patient will demonstrate WFL P/ROM in right shoulder, forearm, wrist, and hand in order to improve independence with B/ADLs.    Time 4    Period Weeks    Status On-going      OT SHORT TERM GOAL #3   Title Patient will demonstrate 3+/5 or better strength in her right shoulder, forearm, wrist in order to assist with caring for her mother and complete gardening tasks.    Time 4    Period Weeks    Status On-going      OT SHORT TERM GOAL #4   Title Patient will improve right grip and pinch strength by 50% for improved ability to open jars and containers.    Time 4    Period Weeks    Status Achieved      OT SHORT TERM GOAL #5   Title Patient will decrease pain to 3/10 or better in her RUE in order to complete ADLs with less difficulty.    Time 4    Period Weeks    Status On-going             OT Long Term Goals - 02/09/20 1100      OT LONG TERM GOAL #1   Title Patient will return to prior level of independence using right arm and hand as dominant with all desired B/IADLs, leisure activities.    Time 8    Period Weeks    Status On-going      OT LONG TERM GOAL #2   Title Patient will improve RUE A/ROM to WNL for improved ability to reach overhead, behind back and use hand to push self out of a chair.    Time 8    Period Weeks    Status On-going      OT LONG TERM GOAL #3   Title Patient will improve right upper extremity strength to 5/5 for improved ability to lift tools and bags of soil when gardening and pick up laundry baskets.    Time 8    Period Weeks    Status On-going      OT LONG TERM GOAL #4   Title  Patient will improve right grip strength to 65 pounds and pinch strength to 15 pounds for improved ability to open containers and complete craft projects.    Time 8    Period  Weeks    Status On-going      OT LONG TERM GOAL #5   Title Patient will decrease right upper extremity pain to 2/10 or better while completing ADLs, and leisure activities.    Time 8    Period Weeks    Status On-going                 Plan - 02/18/20 1300    Clinical Impression Statement A: Continued with manual techniques to address facial restrictions in upper arm and trapezius. Complete AA/ROM seated with increased range observed. Continued with PVC pipe slide and wall wash with excellent ROM and muscle activation noted. Continued with functional reaching at moderate level with focus on forward flexion. Verbal cues provided for form and technique.    Body Structure / Function / Physical Skills ADL;Strength;Pain;UE functional use;ROM;IADL;Fascial restriction;Scar mobility;Wound;Coordination;FMC;Muscle spasms;Skin integrity;Decreased knowledge of precautions    Plan P: Continue working on activation required for flexion, AA/ROM with minimal trapezius activation, grip and pinch tasks. Continue with functional reaching tasks.    OT Home Exercise Plan inital evaluation:  table slides, tendon glides. 7/9 isometrics.    Consulted and Agree with Plan of Care Patient           Patient will benefit from skilled therapeutic intervention in order to improve the following deficits and impairments:   Body Structure / Function / Physical Skills: ADL, Strength, Pain, UE functional use, ROM, IADL, Fascial restriction, Scar mobility, Wound, Coordination, FMC, Muscle spasms, Skin integrity, Decreased knowledge of precautions       Visit Diagnosis: Stiffness of right shoulder, not elsewhere classified  Other symptoms and signs involving the musculoskeletal system    Problem List Patient Active Problem List   Diagnosis Date Noted  . Traumatic tear of supraspinatus tendon of right shoulder 11/18/2019  . Tear of right infraspinatus tendon 11/18/2019  . Right carpal tunnel syndrome  11/04/2019  . Acute pain of right shoulder 11/04/2019  . Hypertension 04/12/2019  . Glucose intolerance 04/12/2019  . Chronic venous insufficiency 04/06/2019  . Osteopenia 04/06/2019    Preston Fleeting, OTR/L 02/18/2020, 1:03 PM  [place], [place], [address] Phone: [phone]   Fax:  [phone]  Name: MANUELITA MOXON MRN: [id number] Date of Birth: Jun 14, 1956

## 2020-02-21 ENCOUNTER — Other Ambulatory Visit: Payer: Self-pay | Admitting: Family Medicine

## 2020-02-22 ENCOUNTER — Ambulatory Visit (HOSPITAL_COMMUNITY): Payer: Medicare Other | Admitting: Occupational Therapy

## 2020-02-22 ENCOUNTER — Encounter (HOSPITAL_COMMUNITY): Payer: Self-pay | Admitting: Occupational Therapy

## 2020-02-22 ENCOUNTER — Other Ambulatory Visit: Payer: Self-pay

## 2020-02-22 DIAGNOSIS — R29898 Other symptoms and signs involving the musculoskeletal system: Secondary | ICD-10-CM

## 2020-02-22 DIAGNOSIS — M25611 Stiffness of right shoulder, not elsewhere classified: Secondary | ICD-10-CM

## 2020-02-22 DIAGNOSIS — M25511 Pain in right shoulder: Secondary | ICD-10-CM

## 2020-02-22 NOTE — Therapy (Signed)
[place], [place], [address] Phone: [phone]   Fax:  [phone]  Occupational Therapy Treatment  Patient Details  Name: Jessica [name] MRN: [id number] Date of Birth: Dec 05, 1955 Referring Provider (OT): Dr. Cheral Bay   Progress Note Reporting Period 01/31/2020 to 02/22/2020  See note below for Objective Data and Assessment of Progress/Goals.       Encounter Date: 02/22/2020   OT End of Session - 02/22/20 1153    Visit Number 20    Number of Visits 24    Date for OT Re-Evaluation 03/01/20    Authorization Type Medicare and BCBS    Progress Note Due on Visit 41    OT Start Time 1117    OT Stop Time 1156    OT Time Calculation (min) 39 min    Activity Tolerance Patient tolerated treatment well    Behavior During Therapy WFL for tasks assessed/performed           Past Medical History:  Diagnosis Date  . Anemia    per patient, had it years ago before hysterectomy years ago  . Arthritis   . Asthma   . Bronchitis   . Carpal tunnel syndrome of right wrist   . Chronic venous insufficiency   . GERD (gastroesophageal reflux disease)   . History of hiatal hernia   . Hypertension   . Pneumonia   . PONV (postoperative nausea and vomiting)     Past Surgical History:  Procedure Laterality Date  . ABDOMINAL HYSTERECTOMY    . CARPAL TUNNEL RELEASE Right 12/16/2019   Procedure: RIGHT CARPAL TUNNEL RELEASE;  Surgeon: Meredith Pel, MD;  Location: [facility];  Service: Orthopedics;  Laterality: Right;  . COLONOSCOPY  06/05/2012   Procedure: COLONOSCOPY;  Surgeon: Rogene Houston, MD;  [facility];  Service: Endoscopy;  Laterality: N/A;  125-changed to 1225 Ann to notify pt  . NISSEN FUNDOPLICATION     x2  . SHOULDER ARTHROSCOPY WITH SUBACROMIAL DECOMPRESSION, ROTATOR CUFF REPAIR AND BICEP TENDON REPAIR Right 12/16/2019   Procedure: RIGHT SHOULDER ARTHROSCOPY, BICEPS TENODESIS, MINI OPEN  ROTATOR CUFF TEAR REPAIR, DEBRIDEMENT;  Surgeon: Meredith Pel, MD;  Location: [facility];  Service: Orthopedics;  Laterality: Right;    There were no vitals filed for this visit.   Subjective Assessment - 02/22/20 1117    Subjective  S: I think it's getting a little stronger.    Currently in Pain? Yes    Pain Score 2     Pain Location Shoulder    Pain Orientation Right    Pain Descriptors / Indicators Aching;Sore    Pain Type Acute pain    Pain Radiating Towards N/A    Pain Onset More than a month ago    Pain Frequency Intermittent    Aggravating Factors  movement    Pain Relieving Factors rest    Effect of Pain on Daily Activities mod effect on ADLs    Multiple Pain Sites No              [facility] OT Assessment - 02/22/20 1117      Assessment   Medical Diagnosis S/P R RCR, Biceps Tenodesis, and CTR      Precautions   Precautions Shoulder    Type of Shoulder Precautions passive and aa/rom of right shoulder, progress to a/rom on 7/1.  sling until 6/26.  wrist:  progress as tolerated  OT Treatments/Exercises (OP) - 02/22/20 1118      Exercises   Exercises Shoulder;Hand      Shoulder Exercises: Supine   Protraction PROM;5 reps;AROM;10 reps    Horizontal ABduction PROM;5 reps;AROM;10 reps    External Rotation PROM;5 reps;AROM;10 reps    Internal Rotation PROM;5 reps;AROM;10 reps    Flexion PROM;5 reps;AROM;10 reps    ABduction PROM;5 reps;AROM;10 reps      Shoulder Exercises: Seated   Protraction AROM;15 reps    Horizontal ABduction AROM;10 reps    External Rotation AROM;15 reps    Internal Rotation AROM;15 reps    Flexion AAROM;15 reps    Abduction AAROM;15 reps      Shoulder Exercises: Standing   Extension Theraband;10 reps    Theraband Level (Shoulder Extension) Level 2 (Red)    Row Theraband;10 reps    Theraband Level (Shoulder Row) Level 2 (Red)      Shoulder Exercises: ROM/Strengthening   UBE (Upper Arm Bike) Level 1, 3'  forward 3' reverse. Pace 5.0    Wall Wash 1'    Proximal Shoulder Strengthening, Supine 10X each, no rest breaks    Other ROM/Strengthening Exercises PVC Pipe slide, flexion, 15X      Manual Therapy   Manual Therapy Myofascial release    Manual therapy comments manual therapy completed seperately from all other interventions this date    Myofascial Release myofascial release and manual stretching to decrease pain and fascial restrictions and improve pain free mobility in right shoulder, upper arm, scapular, and shoulder region, and wrist region.                      OT Short Term Goals - 02/09/20 1100      OT SHORT TERM GOAL #1   Title Patient will be educated and independent with HEP for RUE A/ROM and strength.    Time 4    Period Weeks    Status On-going    Target Date 02/02/20      OT SHORT TERM GOAL #2   Title Patient will demonstrate WFL P/ROM in right shoulder, forearm, wrist, and hand in order to improve independence with B/ADLs.    Time 4    Period Weeks    Status On-going      OT SHORT TERM GOAL #3   Title Patient will demonstrate 3+/5 or better strength in her right shoulder, forearm, wrist in order to assist with caring for her mother and complete gardening tasks.    Time 4    Period Weeks    Status On-going      OT SHORT TERM GOAL #4   Title Patient will improve right grip and pinch strength by 50% for improved ability to open jars and containers.    Time 4    Period Weeks    Status Achieved      OT SHORT TERM GOAL #5   Title Patient will decrease pain to 3/10 or better in her RUE in order to complete ADLs with less difficulty.    Time 4    Period Weeks    Status On-going             OT Long Term Goals - 02/09/20 1100      OT LONG TERM GOAL #1   Title Patient will return to prior level of independence using right arm and hand as dominant with all desired B/IADLs, leisure activities.    Time 8    Period Weeks  Status On-going      OT  LONG TERM GOAL #2   Title Patient will improve RUE A/ROM to WNL for improved ability to reach overhead, behind back and use hand to push self out of a chair.    Time 8    Period Weeks    Status On-going      OT LONG TERM GOAL #3   Title Patient will improve right upper extremity strength to 5/5 for improved ability to lift tools and bags of soil when gardening and pick up laundry baskets.    Time 8    Period Weeks    Status On-going      OT LONG TERM GOAL #4   Title Patient will improve right grip strength to 65 pounds and pinch strength to 15 pounds for improved ability to open containers and complete craft projects.    Time 8    Period Weeks    Status On-going      OT LONG TERM GOAL #5   Title Patient will decrease right upper extremity pain to 2/10 or better while completing ADLs, and leisure activities.    Time 8    Period Weeks    Status On-going                 Plan - 02/22/20 1130    Clinical Impression Statement A: Pt reports she is using her right arm more and more during ADLs and is noting improvements in strength required for lifting her arm during functional tasks.  Continued with manual techniques to address fascial restrictions, also continued with passive stretching. Progressed to A/ROM in supine, pt continues to have trapezius activation however is able to actively move arm through full ROM while supine. Pt attempting protraction and horizontal abduction using A/ROM in sitting with mod trapezius activation, continues to improve with muscle activation and strength for tasks. Added scapular theraband row and extension today. Verbal cuing for form and technique.    Body Structure / Function / Physical Skills ADL;Strength;Pain;UE functional use;ROM;IADL;Fascial restriction;Scar mobility;Wound;Coordination;FMC;Muscle spasms;Skin integrity;Decreased knowledge of precautions    Plan P: continue working towards A/ROM in sitting, continue with functional reaching. Update  HEP for scapular theraband adding retraction if appropriate    OT Home Exercise Plan inital evaluation:  table slides, tendon glides. 7/9 isometrics.    Consulted and Agree with Plan of Care Patient           Patient will benefit from skilled therapeutic intervention in order to improve the following deficits and impairments:   Body Structure / Function / Physical Skills: ADL, Strength, Pain, UE functional use, ROM, IADL, Fascial restriction, Scar mobility, Wound, Coordination, FMC, Muscle spasms, Skin integrity, Decreased knowledge of precautions       Visit Diagnosis: Stiffness of right shoulder, not elsewhere classified  Other symptoms and signs involving the musculoskeletal system  Acute pain of right shoulder    Problem List Patient Active Problem List   Diagnosis Date Noted  . Traumatic tear of supraspinatus tendon of right shoulder 11/18/2019  . Tear of right infraspinatus tendon 11/18/2019  . Right carpal tunnel syndrome 11/04/2019  . Acute pain of right shoulder 11/04/2019  . Hypertension 04/12/2019  . Glucose intolerance 04/12/2019  . Chronic venous insufficiency 04/06/2019  . Osteopenia 04/06/2019   Guadelupe Sabin, OTR/L  [phone] 02/22/2020, 11:57 AM  [place], [place], [address] Phone: [phone]   Fax:  [phone]  Name: ZAYAH KEILMAN  MRN: [id number] Date of Birth: [birth year]

## 2020-02-24 ENCOUNTER — Ambulatory Visit (HOSPITAL_COMMUNITY): Payer: Medicare Other

## 2020-02-24 ENCOUNTER — Other Ambulatory Visit: Payer: Self-pay

## 2020-02-24 ENCOUNTER — Encounter (HOSPITAL_COMMUNITY): Payer: Self-pay

## 2020-02-24 DIAGNOSIS — M25511 Pain in right shoulder: Secondary | ICD-10-CM

## 2020-02-24 DIAGNOSIS — M25531 Pain in right wrist: Secondary | ICD-10-CM

## 2020-02-24 DIAGNOSIS — M25631 Stiffness of right wrist, not elsewhere classified: Secondary | ICD-10-CM

## 2020-02-24 DIAGNOSIS — M25611 Stiffness of right shoulder, not elsewhere classified: Secondary | ICD-10-CM | POA: Diagnosis not present

## 2020-02-24 DIAGNOSIS — R29898 Other symptoms and signs involving the musculoskeletal system: Secondary | ICD-10-CM

## 2020-02-24 NOTE — Therapy (Signed)
[place], [place], [address] Phone: [phone]   Fax:  [phone]  Occupational Therapy Treatment  Patient Details  Name: Jessica [name] MRN: [id number] Date of Birth: 04-08-1956 Referring Provider (OT): Dr. Cheral Bay   Encounter Date: 02/24/2020   OT End of Session - 02/24/20 1114    Visit Number 21    Number of Visits 24    Date for OT Re-Evaluation 03/01/20    Authorization Type Medicare and BCBS    Progress Note Due on Visit 30    OT Start Time [phone]    OT Stop Time 1030    OT Time Calculation (min) 42 min    Activity Tolerance Patient tolerated treatment well    Behavior During Therapy [facility] for tasks assessed/performed           Past Medical History:  Diagnosis Date  . Anemia    per patient, had it years ago before hysterectomy years ago  . Arthritis   . Asthma   . Bronchitis   . Carpal tunnel syndrome of right wrist   . Chronic venous insufficiency   . GERD (gastroesophageal reflux disease)   . History of hiatal hernia   . Hypertension   . Pneumonia   . PONV (postoperative nausea and vomiting)     Past Surgical History:  Procedure Laterality Date  . ABDOMINAL HYSTERECTOMY    . CARPAL TUNNEL RELEASE Right 12/16/2019   Procedure: RIGHT CARPAL TUNNEL RELEASE;  Surgeon: Meredith Pel, MD;  Location: [facility];  Service: Orthopedics;  Laterality: Right;  . COLONOSCOPY  06/05/2012   Procedure: COLONOSCOPY;  Surgeon: Rogene Houston, MD;  [facility];  Service: Endoscopy;  Laterality: N/A;  125-changed to 1225 Ann to notify pt  . NISSEN FUNDOPLICATION     x2  . SHOULDER ARTHROSCOPY WITH SUBACROMIAL DECOMPRESSION, ROTATOR CUFF REPAIR AND BICEP TENDON REPAIR Right 12/16/2019   Procedure: RIGHT SHOULDER ARTHROSCOPY, BICEPS TENODESIS, MINI OPEN ROTATOR CUFF TEAR REPAIR, DEBRIDEMENT;  Surgeon: Meredith Pel, MD;  Location: [facility];  Service: Orthopedics;  Laterality: Right;     There were no vitals filed for this visit.   Subjective Assessment - 02/24/20 0958    Subjective  S: I made myself turn on a light switch the other day with this arm. That was a challenge.    Currently in Pain? Yes    Pain Score 3     Pain Location Shoulder   bicep   Pain Orientation Right    Pain Descriptors / Indicators Sore    Pain Type Acute pain              [facility] OT Assessment - 02/24/20 1004      Assessment   Medical Diagnosis S/P R RCR, Biceps Tenodesis, and CTR      Precautions   Precautions Shoulder    Type of Shoulder Precautions progress as tolerate for shoulder and wrist.                    OT Treatments/Exercises (OP) - 02/24/20 1005      Exercises   Exercises Shoulder;Hand      Shoulder Exercises: Supine   Protraction PROM;5 reps;AROM;10 reps    Horizontal ABduction PROM;5 reps;AROM;10 reps    External Rotation PROM;5 reps;AROM;10 reps    Internal Rotation PROM;5 reps;AROM;10 reps    Flexion PROM;5 reps;AROM;10 reps    ABduction PROM;5 reps;AROM;10 reps  Shoulder Exercises: Seated   Protraction AROM;10 reps;Limitations    Protraction Limitations lower under shoulder to depress shoulder    Flexion AROM;10 reps;Limitations    Flexion Limitations to shoulder level      Shoulder Exercises: ROM/Strengthening   UBE (Upper Arm Bike) Level 1 2' forward 2' reverse    pace: 4.-5.0   Wall Wash 1'    Other ROM/Strengthening Exercises Seated; resistive clothespins used on highest horizontal bar focusing on proper form during shoulder flexion to shoulder height while transitioning to full arm extended. No higher than shoulder level     Other ROM/Strengthening Exercises PVC Pipe slide, flexion, 10X      Modalities   Modalities Moist Heat      Moist Heat Therapy   Number Minutes Moist Heat 5 Minutes    Moist Heat Location Shoulder   and bicep                   OT Short Term Goals - 02/24/20 1020      OT SHORT TERM GOAL #1    Title Patient will be educated and independent with HEP for RUE A/ROM and strength.    Time 4    Period Weeks    Status On-going    Target Date 02/02/20      OT SHORT TERM GOAL #2   Title Patient will demonstrate WFL P/ROM in right shoulder, forearm, wrist, and hand in order to improve independence with B/ADLs.    Time 4    Period Weeks    Status On-going      OT SHORT TERM GOAL #3   Title Patient will demonstrate 3+/5 or better strength in her right shoulder, forearm, wrist in order to assist with caring for her mother and complete gardening tasks.    Time 4    Period Weeks    Status On-going      OT SHORT TERM GOAL #4   Title Patient will improve right grip and pinch strength by 50% for improved ability to open jars and containers.    Time 4    Period Weeks      OT SHORT TERM GOAL #5   Title Patient will decrease pain to 3/10 or better in her RUE in order to complete ADLs with less difficulty.    Time 4    Period Weeks    Status On-going             OT Long Term Goals - 02/09/20 1100      OT LONG TERM GOAL #1   Title Patient will return to prior level of independence using right arm and hand as dominant with all desired B/IADLs, leisure activities.    Time 8    Period Weeks    Status On-going      OT LONG TERM GOAL #2   Title Patient will improve RUE A/ROM to WNL for improved ability to reach overhead, behind back and use hand to push self out of a chair.    Time 8    Period Weeks    Status On-going      OT LONG TERM GOAL #3   Title Patient will improve right upper extremity strength to 5/5 for improved ability to lift tools and bags of soil when gardening and pick up laundry baskets.    Time 8    Period Weeks    Status On-going      OT LONG TERM GOAL #4   Title Patient will  improve right grip strength to 65 pounds and pinch strength to 15 pounds for improved ability to open containers and complete craft projects.    Time 8    Period Weeks    Status  On-going      OT LONG TERM GOAL #5   Title Patient will decrease right upper extremity pain to 2/10 or better while completing ADLs, and leisure activities.    Time 8    Period Weeks    Status On-going                 Plan - 02/24/20 1115    Clinical Impression Statement A: Focused on shoulder strengthening while providing VC for form and technique. patient with scapular elevation when attempting to reach at or above shoulder level. Increased difficulty present with arm unweighted. Heat applied to shoulder and bicep at start of session to decrease fascial restrictions and muscle tightness.    Body Structure / Function / Physical Skills ADL;Strength;Pain;UE functional use;ROM;IADL;Fascial restriction;Scar mobility;Wound;Coordination;FMC;Muscle spasms;Skin integrity;Decreased knowledge of precautions    Plan P: Update HEP for scapular theraband adding retraction if appropriate.Grip and pinch strengthening    Consulted and Agree with Plan of Care Patient           Patient will benefit from skilled therapeutic intervention in order to improve the following deficits and impairments:   Body Structure / Function / Physical Skills: ADL, Strength, Pain, UE functional use, ROM, IADL, Fascial restriction, Scar mobility, Wound, Coordination, FMC, Muscle spasms, Skin integrity, Decreased knowledge of precautions       Visit Diagnosis: Stiffness of right shoulder, not elsewhere classified  Other symptoms and signs involving the musculoskeletal system  Acute pain of right shoulder  Stiffness of right wrist, not elsewhere classified  Pain in right wrist    Problem List Patient Active Problem List   Diagnosis Date Noted  . Traumatic tear of supraspinatus tendon of right shoulder 11/18/2019  . Tear of right infraspinatus tendon 11/18/2019  . Right carpal tunnel syndrome 11/04/2019  . Acute pain of right shoulder 11/04/2019  . Hypertension 04/12/2019  . Glucose intolerance  04/12/2019  . Chronic venous insufficiency 04/06/2019  . Osteopenia 04/06/2019   Ailene Ravel, OTR/L,CBIS  [phone]  02/24/2020, 11:18 AM  [place] [address] [place] [address] [place], [place], [address] Phone: [phone]   Fax:  [phone]  Name: ANJELICA GORNIAK MRN: [id number] Date of Birth: Aug 16, 1955

## 2020-02-25 ENCOUNTER — Ambulatory Visit (HOSPITAL_COMMUNITY): Payer: Medicare Other

## 2020-02-25 ENCOUNTER — Encounter (HOSPITAL_COMMUNITY): Payer: Self-pay

## 2020-02-25 DIAGNOSIS — M25511 Pain in right shoulder: Secondary | ICD-10-CM

## 2020-02-25 DIAGNOSIS — R29898 Other symptoms and signs involving the musculoskeletal system: Secondary | ICD-10-CM

## 2020-02-25 DIAGNOSIS — M25611 Stiffness of right shoulder, not elsewhere classified: Secondary | ICD-10-CM | POA: Diagnosis not present

## 2020-02-25 DIAGNOSIS — M25631 Stiffness of right wrist, not elsewhere classified: Secondary | ICD-10-CM

## 2020-02-25 DIAGNOSIS — M25531 Pain in right wrist: Secondary | ICD-10-CM

## 2020-02-25 NOTE — Patient Instructions (Signed)

## 2020-02-25 NOTE — Therapy (Signed)
[facility] [place], [place], [address] Phone: [phone]   Fax:  [phone]  Occupational Therapy Treatment  Patient Details  Name: Jessica [name] MRN: [id number] Date of Birth: 02/09/1956 Referring Provider (OT): Dr. Cheral Bay   Encounter Date: 02/25/2020   OT End of Session - 02/25/20 1130    Visit Number 22    Number of Visits 24    Date for OT Re-Evaluation 03/01/20    Authorization Type Medicare and BCBS    Progress Note Due on Visit 5    OT Start Time 1115    OT Stop Time 1200    OT Time Calculation (min) 45 min    Activity Tolerance Patient tolerated treatment well    Behavior During Therapy [facility] for tasks assessed/performed           Past Medical History:  Diagnosis Date   Anemia    per patient, had it years ago before hysterectomy years ago   Arthritis    Asthma    Bronchitis    Carpal tunnel syndrome of right wrist    Chronic venous insufficiency    GERD (gastroesophageal reflux disease)    History of hiatal hernia    Hypertension    Pneumonia    PONV (postoperative nausea and vomiting)     Past Surgical History:  Procedure Laterality Date   ABDOMINAL HYSTERECTOMY     CARPAL TUNNEL RELEASE Right 12/16/2019   Procedure: RIGHT CARPAL TUNNEL RELEASE;  Surgeon: Meredith Pel, MD;  Location: [place];  Service: Orthopedics;  Laterality: Right;   COLONOSCOPY  06/05/2012   Procedure: COLONOSCOPY;  Surgeon: Rogene Houston, MD;  [facility];  Service: Endoscopy;  Laterality: N/A;  125-changed to 1225 Ann to notify pt   NISSEN FUNDOPLICATION     x2   SHOULDER ARTHROSCOPY WITH SUBACROMIAL DECOMPRESSION, ROTATOR CUFF REPAIR AND BICEP TENDON REPAIR Right 12/16/2019   Procedure: RIGHT SHOULDER ARTHROSCOPY, BICEPS TENODESIS, MINI OPEN ROTATOR CUFF TEAR REPAIR, DEBRIDEMENT;  Surgeon: Meredith Pel, MD;  Location: [place];  Service: Orthopedics;  Laterality: Right;     There were no vitals filed for this visit.   Subjective Assessment - 02/25/20 1130    Subjective  S: I still have a little soreness in the bicep.    Currently in Pain? Yes    Pain Score 2     Pain Location Arm   bicep region   Pain Orientation Right    Pain Descriptors / Indicators Sore    Pain Type Acute pain              [facility] OT Assessment - 02/25/20 1124      Assessment   Medical Diagnosis S/P R RCR, Biceps Tenodesis, and CTR      Precautions   Precautions Shoulder    Type of Shoulder Precautions progress as tolerate for shoulder and wrist.                    OT Treatments/Exercises (OP) - 02/25/20 1125      Exercises   Exercises Hand      Shoulder Exercises: Standing   Extension Theraband;15 reps   handles   Theraband Level (Shoulder Extension) Level 2 (Red)    Row Theraband;15 reps   handles   Theraband Level (Shoulder Row) Level 2 (Red)    Retraction Theraband;15 reps   handles   Theraband Level (Shoulder Retraction) Level 2 (Red)  Hand Exercises   Hand Gripper with Large Beads all beads with gripper set at 29#    Hand Gripper with Medium Beads all beads gripper at 29#    Hand Gripper with Small Beads all beads gripper at 29#   horizontal   Sponges focused on 3 point pinch strengh while alternating between green and blue resistive clothespins for a totoal of 30.                  OT Education - 02/25/20 1148    Education Details red scapular theraband exercises    Person(s) Educated Patient    Methods Explanation;Demonstration;Handout    Comprehension Verbalized understanding;Returned demonstration            OT Short Term Goals - 02/24/20 1020      OT SHORT TERM GOAL #1   Title Patient will be educated and independent with HEP for RUE A/ROM and strength.    Time 4    Period Weeks    Status On-going    Target Date 02/02/20      OT SHORT TERM GOAL #2   Title Patient will demonstrate WFL P/ROM in right shoulder,  forearm, wrist, and hand in order to improve independence with B/ADLs.    Time 4    Period Weeks    Status On-going      OT SHORT TERM GOAL #3   Title Patient will demonstrate 3+/5 or better strength in her right shoulder, forearm, wrist in order to assist with caring for her mother and complete gardening tasks.    Time 4    Period Weeks    Status On-going      OT SHORT TERM GOAL #4   Title Patient will improve right grip and pinch strength by 50% for improved ability to open jars and containers.    Time 4    Period Weeks      OT SHORT TERM GOAL #5   Title Patient will decrease pain to 3/10 or better in her RUE in order to complete ADLs with less difficulty.    Time 4    Period Weeks    Status On-going             OT Long Term Goals - 02/09/20 1100      OT LONG TERM GOAL #1   Title Patient will return to prior level of independence using right arm and hand as dominant with all desired B/IADLs, leisure activities.    Time 8    Period Weeks    Status On-going      OT LONG TERM GOAL #2   Title Patient will improve RUE A/ROM to WNL for improved ability to reach overhead, behind back and use hand to push self out of a chair.    Time 8    Period Weeks    Status On-going      OT LONG TERM GOAL #3   Title Patient will improve right upper extremity strength to 5/5 for improved ability to lift tools and bags of soil when gardening and pick up laundry baskets.    Time 8    Period Weeks    Status On-going      OT LONG TERM GOAL #4   Title Patient will improve right grip strength to 65 pounds and pinch strength to 15 pounds for improved ability to open containers and complete craft projects.    Time 8    Period Weeks    Status On-going  OT LONG TERM GOAL #5   Title Patient will decrease right upper extremity pain to 2/10 or better while completing ADLs, and leisure activities.    Time 8    Period Weeks    Status On-going                 Plan - 02/25/20 1206     Clinical Impression Statement A: Worked on grip and pinch strength with patient completing pinch strengthening using blue and green while still experience min-moderate difficulty depending on resistance. Handgripper activity completed with min difficulty at 29#. HEP was updated for red scapular strengthening exercises. VC for form and technique. Pt completed with great carry over.    Body Structure / Function / Physical Skills ADL;Strength;Pain;UE functional use;ROM;IADL;Fascial restriction;Scar mobility;Wound;Coordination;FMC;Muscle spasms;Skin integrity;Decreased knowledge of precautions    Plan P: reassessment    Consulted and Agree with Plan of Care Patient           Patient will benefit from skilled therapeutic intervention in order to improve the following deficits and impairments:   Body Structure / Function / Physical Skills: ADL, Strength, Pain, UE functional use, ROM, IADL, Fascial restriction, Scar mobility, Wound, Coordination, FMC, Muscle spasms, Skin integrity, Decreased knowledge of precautions       Visit Diagnosis: Acute pain of right shoulder  Stiffness of right wrist, not elsewhere classified  Pain in right wrist  Other symptoms and signs involving the musculoskeletal system  Stiffness of right shoulder, not elsewhere classified    Problem List Patient Active Problem List   Diagnosis Date Noted   Traumatic tear of supraspinatus tendon of right shoulder 11/18/2019   Tear of right infraspinatus tendon 11/18/2019   Right carpal tunnel syndrome 11/04/2019   Acute pain of right shoulder 11/04/2019   Hypertension 04/12/2019   Glucose intolerance 04/12/2019   Chronic venous insufficiency 04/06/2019   Osteopenia 04/06/2019   Ailene Ravel, OTR/L,CBIS  [phone]  02/25/2020, 12:11 PM  [place], [place], [address] Phone: [phone]   Fax:  [phone]  Name: Jessica [name] MRN: [id number] Date of Birth: [birth year]

## 2020-02-28 ENCOUNTER — Telehealth (HOSPITAL_COMMUNITY): Payer: Self-pay | Admitting: Occupational Therapy

## 2020-02-28 NOTE — Telephone Encounter (Signed)
pt cancelled appts for this week because she is on vacation

## 2020-02-29 ENCOUNTER — Encounter (HOSPITAL_COMMUNITY): Payer: Medicare Other | Admitting: Occupational Therapy

## 2020-03-02 ENCOUNTER — Encounter (HOSPITAL_COMMUNITY): Payer: Medicare Other | Admitting: Occupational Therapy

## 2020-03-03 ENCOUNTER — Ambulatory Visit (HOSPITAL_COMMUNITY): Payer: Medicare Other | Admitting: Occupational Therapy

## 2020-03-09 ENCOUNTER — Ambulatory Visit (HOSPITAL_COMMUNITY): Payer: Medicare Other

## 2020-03-10 ENCOUNTER — Encounter (HOSPITAL_COMMUNITY): Payer: Medicare Other | Admitting: Specialist

## 2020-03-14 ENCOUNTER — Other Ambulatory Visit: Payer: Self-pay

## 2020-03-14 ENCOUNTER — Encounter (HOSPITAL_COMMUNITY): Payer: Self-pay | Admitting: Occupational Therapy

## 2020-03-14 ENCOUNTER — Ambulatory Visit (HOSPITAL_COMMUNITY): Payer: Medicare Other | Admitting: Occupational Therapy

## 2020-03-14 DIAGNOSIS — M25611 Stiffness of right shoulder, not elsewhere classified: Secondary | ICD-10-CM | POA: Diagnosis not present

## 2020-03-14 DIAGNOSIS — M25511 Pain in right shoulder: Secondary | ICD-10-CM

## 2020-03-14 DIAGNOSIS — R29898 Other symptoms and signs involving the musculoskeletal system: Secondary | ICD-10-CM

## 2020-03-14 NOTE — Therapy (Signed)
[place], [place], [address] Phone: [phone]   Fax:  [phone]  Occupational Therapy Reassessment & Treatment (recertification)  Patient Details  Name: MABELL ESGUERRA MRN: [id number] Date of Birth: 1955-11-03 Referring Provider (OT): Dr. Cheral Bay   Encounter Date: 03/14/2020   OT End of Session - 03/14/20 1204    Visit Number 23    Number of Visits 31    Date for OT Re-Evaluation 04/13/20    Authorization Type Medicare and BCBS    Progress Note Due on Visit 30    OT Start Time 1117    OT Stop Time 1159    OT Time Calculation (min) 42 min    Activity Tolerance Patient tolerated treatment well    Behavior During Therapy [facility] for tasks assessed/performed           Past Medical History:  Diagnosis Date  . Anemia    per patient, had it years ago before hysterectomy years ago  . Arthritis   . Asthma   . Bronchitis   . Carpal tunnel syndrome of right wrist   . Chronic venous insufficiency   . GERD (gastroesophageal reflux disease)   . History of hiatal hernia   . Hypertension   . Pneumonia   . PONV (postoperative nausea and vomiting)     Past Surgical History:  Procedure Laterality Date  . ABDOMINAL HYSTERECTOMY    . CARPAL TUNNEL RELEASE Right 12/16/2019   Procedure: RIGHT CARPAL TUNNEL RELEASE;  Surgeon: Meredith Pel, MD;  Location: [place];  Service: Orthopedics;  Laterality: Right;  . COLONOSCOPY  06/05/2012   Procedure: COLONOSCOPY;  Surgeon: Rogene Houston, MD;  [facility];  Service: Endoscopy;  Laterality: N/A;  125-changed to 1225 Ann to notify pt  . NISSEN FUNDOPLICATION     x2  . SHOULDER ARTHROSCOPY WITH SUBACROMIAL DECOMPRESSION, ROTATOR CUFF REPAIR AND BICEP TENDON REPAIR Right 12/16/2019   Procedure: RIGHT SHOULDER ARTHROSCOPY, BICEPS TENODESIS, MINI OPEN ROTATOR CUFF TEAR REPAIR, DEBRIDEMENT;  Surgeon: Meredith Pel, MD;  Location: [place];  Service:  Orthopedics;  Laterality: Right;    There were no vitals filed for this visit.   Subjective Assessment - 03/14/20 1117    Subjective  S: My arm has been ok.    Currently in Pain? Yes    Pain Score 2     Pain Location Shoulder    Pain Orientation Right    Pain Descriptors / Indicators Sore    Pain Type Acute pain    Pain Radiating Towards N/A    Pain Onset More than a month ago    Pain Frequency Intermittent    Aggravating Factors  movement    Pain Relieving Factors rest    Effect of Pain on Daily Activities mod effect on ADLs    Multiple Pain Sites No              [facility] OT Assessment - 03/14/20 1117      Assessment   Medical Diagnosis S/P R RCR, Biceps Tenodesis, and CTR      Precautions   Precautions Shoulder    Type of Shoulder Precautions progress as tolerate for shoulder and wrist.      Observation/Other Assessments   Focus on Therapeutic Outcomes (FOTO)  57/100   46/100 previous     Palpation   Palpation comment min fascial restrictions in right shoulder region.  right wrist healing surgical incision with minimal  fascial restricitons       AROM   Overall AROM Comments Assessed seated, er/IR adducted    AROM Assessment Site Shoulder    Right/Left Shoulder Right    Right Shoulder Flexion 110 Degrees   54 previous   Right Shoulder ABduction 120 Degrees   87 previous   Right Shoulder Internal Rotation 70 Degrees   same as previous   Right Shoulder External Rotation 59 Degrees   45 previous     PROM   Overall PROM Comments assessed in supine, external and internal with shoulder adducted    Right Shoulder Flexion 160 Degrees   140 previous   Right Shoulder ABduction 155 Degrees   150 previous   Right Shoulder Internal Rotation 65 Degrees   same as previous   Right Shoulder External Rotation 70 Degrees   same as previous     Strength   Overall Strength Comments Assessed seated, er/IR adducted    Strength Assessment Site Shoulder    Right/Left Shoulder Right      Right Shoulder Flexion 3-/5   2+/5 previous   Right Shoulder ABduction 3/5   3-/5 previous   Right Shoulder Internal Rotation 5/5   3/5 previous   Right Shoulder External Rotation 4+/5   3/5 previous     Hand Function   Right Hand Grip (lbs) 63   44 previous   Right Hand Lateral Pinch 16 lbs   15 previous   Right Hand 3 Point Pinch 10 lbs   8 previous                   OT Treatments/Exercises (OP) - 03/14/20 1122      Exercises   Exercises Shoulder;Hand      Shoulder Exercises: Supine   Protraction PROM;5 reps    Horizontal ABduction PROM;5 reps    External Rotation PROM;5 reps    Internal Rotation PROM;5 reps    Flexion PROM;5 reps    ABduction PROM;5 reps      Shoulder Exercises: Seated   Protraction AROM;10 reps    Horizontal ABduction AROM;10 reps    External Rotation AROM;15 reps    Internal Rotation AROM;15 reps    Flexion AROM;10 reps    Abduction AROM;10 reps      Shoulder Exercises: Standing   Extension Theraband;10 reps    Theraband Level (Shoulder Extension) Level 2 (Red)    Row Theraband;10 reps    Theraband Level (Shoulder Row) Level 2 (Red)    Retraction Theraband;10 reps    Theraband Level (Shoulder Retraction) Level 2 (Red)      Shoulder Exercises: ROM/Strengthening   UBE (Upper Arm Bike) Level 1 3' forward 3' reverse, pace: 5.0      Functional Reaching Activities   Mid Level Pt placing 10 cones on middle shelf of overhead cabinet, removing in abduction      Manual Therapy   Manual Therapy Myofascial release    Manual therapy comments manual therapy completed seperately from all other interventions this date    Myofascial Release myofascial release and manual stretching to decrease pain and fascial restrictions and improve pain free mobility in right shoulder, upper arm, scapular, and shoulder region, and wrist region.                      OT Short Term Goals - 03/14/20 1133      OT SHORT TERM GOAL #1   Title Patient will  be educated and independent with  HEP for RUE A/ROM and strength.    Time 4    Period Weeks    Status Achieved    Target Date 02/02/20      OT SHORT TERM GOAL #2   Title Patient will demonstrate WFL P/ROM in right shoulder, forearm, wrist, and hand in order to improve independence with B/ADLs.    Time 4    Period Weeks    Status Achieved      OT SHORT TERM GOAL #3   Title Patient will demonstrate 3+/5 or better strength in her right shoulder, forearm, wrist in order to assist with caring for her mother and complete gardening tasks.    Time 4    Period Weeks    Status Partially Met      OT SHORT TERM GOAL #4   Title Patient will improve right grip and pinch strength by 50% for improved ability to open jars and containers.    Time 4    Period Weeks      OT SHORT TERM GOAL #5   Title Patient will decrease pain to 3/10 or better in her RUE in order to complete ADLs with less difficulty.    Time 4    Period Weeks    Status Achieved             OT Long Term Goals - 03/14/20 1133      OT LONG TERM GOAL #1   Title Patient will return to prior level of independence using right arm and hand as dominant with all desired B/IADLs, leisure activities.    Time 8    Period Weeks    Status On-going      OT LONG TERM GOAL #2   Title Patient will improve RUE A/ROM to WNL for improved ability to reach overhead, behind back and use hand to push self out of a chair.    Time 8    Period Weeks    Status On-going      OT LONG TERM GOAL #3   Title Patient will improve right upper extremity strength to 5/5 for improved ability to lift tools and bags of soil when gardening and pick up laundry baskets.    Time 8    Period Weeks    Status On-going      OT LONG TERM GOAL #4   Title Patient will improve right grip strength to 65 pounds and pinch strength to 15 pounds for improved ability to open containers and complete craft projects.    Time 8    Period Weeks    Status On-going      OT  LONG TERM GOAL #5   Title Patient will decrease right upper extremity pain to 2/10 or better while completing ADLs, and leisure activities.    Time 8    Period Weeks    Status On-going                 Plan - 03/14/20 1155    Clinical Impression Statement A: Reassessment completed this session, pt reporting she is noticing improvements in her ability to reach shelving, light switches, and the sink faucet. She reports continued difficulty with strength tasks including lifting items and pushing up from her hands. Pt has met 4/5 STGs and has partially met the remaining STG. She is progressing towards LTGs at this time including ROM and strength, as well as activity tolerance. Continued with A/ROM today, pt is now able to actively forward flex her RUE to  above shoulder level. Continued with A/ROM and theraband exercises today. Verbal cuing for form and technique.    Body Structure / Function / Physical Skills ADL;Strength;Pain;UE functional use;ROM;IADL;Fascial restriction;Scar mobility;Wound;Coordination;FMC;Muscle spasms;Skin integrity;Decreased knowledge of precautions    Rehab Potential Good    Clinical Decision Making Several treatment options, min-mod task modification necessary    Comorbidities Affecting Occupational Performance: May have comorbidities impacting occupational performance    Modification or Assistance to Complete Evaluation  No modification of tasks or assist necessary to complete eval    OT Frequency 2x / week    OT Duration 4 weeks    OT Treatment/Interventions Self-care/ADL training;Moist Heat;DME and/or AE instruction;Splinting;Therapeutic activities;Ultrasound;Therapeutic exercise;Scar mobilization;Passive range of motion;Neuromuscular education;Cryotherapy;Iontophoresis;Electrical Stimulation;Energy conservation;Manual Therapy;Dry needling;Other (comment)    Plan P: Continue with skilled OT services to address ROM, strength, and activity tolerance to improve RUE  functional use. Next session: continue with A/ROM and functional reaching, update HEP    OT Home Exercise Plan inital evaluation:  table slides, tendon glides. 7/9 isometrics.    Consulted and Agree with Plan of Care Patient           Patient will benefit from skilled therapeutic intervention in order to improve the following deficits and impairments:   Body Structure / Function / Physical Skills: ADL, Strength, Pain, UE functional use, ROM, IADL, Fascial restriction, Scar mobility, Wound, Coordination, FMC, Muscle spasms, Skin integrity, Decreased knowledge of precautions       Visit Diagnosis: Acute pain of right shoulder  Other symptoms and signs involving the musculoskeletal system  Stiffness of right shoulder, not elsewhere classified    Problem List Patient Active Problem List   Diagnosis Date Noted  . Traumatic tear of supraspinatus tendon of right shoulder 11/18/2019  . Tear of right infraspinatus tendon 11/18/2019  . Right carpal tunnel syndrome 11/04/2019  . Acute pain of right shoulder 11/04/2019  . Hypertension 04/12/2019  . Glucose intolerance 04/12/2019  . Chronic venous insufficiency 04/06/2019  . Osteopenia 04/06/2019   Guadelupe Sabin, OTR/L  [phone] 03/14/2020, 12:05 PM  [facility] [address] [place], [place], [address] Phone: [phone]   Fax:  [phone]  Name: MUNACHIMSO RIGDON MRN: [id number] Date of Birth: Jan 28, 1956

## 2020-03-15 ENCOUNTER — Ambulatory Visit (INDEPENDENT_AMBULATORY_CARE_PROVIDER_SITE_OTHER): Payer: Medicare Other | Admitting: Orthopedic Surgery

## 2020-03-15 ENCOUNTER — Encounter (HOSPITAL_COMMUNITY): Payer: Medicare Other

## 2020-03-15 DIAGNOSIS — S46811A Strain of other muscles, fascia and tendons at shoulder and upper arm level, right arm, initial encounter: Secondary | ICD-10-CM

## 2020-03-16 ENCOUNTER — Ambulatory Visit (HOSPITAL_COMMUNITY): Payer: Medicare Other | Attending: Orthopedic Surgery | Admitting: Occupational Therapy

## 2020-03-16 ENCOUNTER — Other Ambulatory Visit: Payer: Self-pay

## 2020-03-16 ENCOUNTER — Encounter (HOSPITAL_COMMUNITY): Payer: Self-pay | Admitting: Occupational Therapy

## 2020-03-16 DIAGNOSIS — M25511 Pain in right shoulder: Secondary | ICD-10-CM | POA: Insufficient documentation

## 2020-03-16 DIAGNOSIS — M25611 Stiffness of right shoulder, not elsewhere classified: Secondary | ICD-10-CM | POA: Diagnosis present

## 2020-03-16 DIAGNOSIS — M25631 Stiffness of right wrist, not elsewhere classified: Secondary | ICD-10-CM | POA: Insufficient documentation

## 2020-03-16 DIAGNOSIS — M25531 Pain in right wrist: Secondary | ICD-10-CM | POA: Insufficient documentation

## 2020-03-16 DIAGNOSIS — R29898 Other symptoms and signs involving the musculoskeletal system: Secondary | ICD-10-CM | POA: Diagnosis present

## 2020-03-16 NOTE — Therapy (Signed)
[place], [place], [address] Phone: [phone]   Fax:  [phone]  Occupational Therapy Treatment  Patient Details  Name: Jessica [name] MRN: [id number] Date of Birth: 24-Jul-1955 Referring Provider (OT): Dr. Cheral Bay   Encounter Date: 03/16/2020   OT End of Session - 03/16/20 1200    Visit Number 24    Number of Visits 31    Date for OT Re-Evaluation 04/13/20    Authorization Type Medicare and BCBS    Progress Note Due on Visit 30    OT Start Time 1119    OT Stop Time 1201    OT Time Calculation (min) 42 min    Activity Tolerance Patient tolerated treatment well    Behavior During Therapy [facility] for tasks assessed/performed           Past Medical History:  Diagnosis Date  . Anemia    per patient, had it years ago before hysterectomy years ago  . Arthritis   . Asthma   . Bronchitis   . Carpal tunnel syndrome of right wrist   . Chronic venous insufficiency   . GERD (gastroesophageal reflux disease)   . History of hiatal hernia   . Hypertension   . Pneumonia   . PONV (postoperative nausea and vomiting)     Past Surgical History:  Procedure Laterality Date  . ABDOMINAL HYSTERECTOMY    . CARPAL TUNNEL RELEASE Right 12/16/2019   Procedure: RIGHT CARPAL TUNNEL RELEASE;  Surgeon: Meredith Pel, MD;  Location: [place];  Service: Orthopedics;  Laterality: Right;  . COLONOSCOPY  06/05/2012   Procedure: COLONOSCOPY;  Surgeon: Rogene Houston, MD;  [facility];  Service: Endoscopy;  Laterality: N/A;  125-changed to 1225 Ann to notify pt  . NISSEN FUNDOPLICATION     x2  . SHOULDER ARTHROSCOPY WITH SUBACROMIAL DECOMPRESSION, ROTATOR CUFF REPAIR AND BICEP TENDON REPAIR Right 12/16/2019   Procedure: RIGHT SHOULDER ARTHROSCOPY, BICEPS TENODESIS, MINI OPEN ROTATOR CUFF TEAR REPAIR, DEBRIDEMENT;  Surgeon: Meredith Pel, MD;  Location: [place];  Service: Orthopedics;  Laterality: Right;     There were no vitals filed for this visit.   Subjective Assessment - 03/16/20 1121    Subjective  S: I did a little cleaning this morning.    Currently in Pain? No/denies              [facility] OT Assessment - 03/16/20 1121      Assessment   Medical Diagnosis S/P R RCR, Biceps Tenodesis, and CTR      Precautions   Precautions Shoulder    Type of Shoulder Precautions progress as tolerate for shoulder and wrist.                    OT Treatments/Exercises (OP) - 03/16/20 1122      Exercises   Exercises Shoulder;Hand      Shoulder Exercises: Supine   Protraction PROM;5 reps    Horizontal ABduction PROM;5 reps    External Rotation PROM;5 reps    Internal Rotation PROM;5 reps    Flexion PROM;5 reps    ABduction PROM;5 reps      Shoulder Exercises: Seated   Protraction AROM;10 reps    Horizontal ABduction AROM;10 reps    External Rotation AROM;10 reps    Internal Rotation AROM;10 reps    Flexion AROM;10 reps    Abduction AROM;10 reps      Shoulder Exercises: ROM/Strengthening  UBE (Upper Arm Bike) Level 1 3' reverse, pace: 5.0    Over Head Lace 2'      Functional Reaching Activities   Mid Level Pt placing and removing squigz from overhead cabinet door. Placing at head height working on good posture and form.       Manual Therapy   Manual Therapy Myofascial release    Manual therapy comments manual therapy completed seperately from all other interventions this date    Myofascial Release myofascial release and manual stretching to decrease pain and fascial restrictions and improve pain free mobility in right shoulder, upper arm, scapular, and shoulder region, and wrist region.                    OT Education - 03/16/20 1148    Education Details A/ROM    Person(s) Educated Patient    Methods Explanation;Demonstration;Handout    Comprehension Verbalized understanding;Returned demonstration            OT Short Term Goals - 03/14/20 1133       OT SHORT TERM GOAL #1   Title Patient will be educated and independent with HEP for RUE A/ROM and strength.    Time 4    Period Weeks    Status Achieved    Target Date 02/02/20      OT SHORT TERM GOAL #2   Title Patient will demonstrate WFL P/ROM in right shoulder, forearm, wrist, and hand in order to improve independence with B/ADLs.    Time 4    Period Weeks    Status Achieved      OT SHORT TERM GOAL #3   Title Patient will demonstrate 3+/5 or better strength in her right shoulder, forearm, wrist in order to assist with caring for her mother and complete gardening tasks.    Time 4    Period Weeks    Status Partially Met      OT SHORT TERM GOAL #4   Title Patient will improve right grip and pinch strength by 50% for improved ability to open jars and containers.    Time 4    Period Weeks      OT SHORT TERM GOAL #5   Title Patient will decrease pain to 3/10 or better in her RUE in order to complete ADLs with less difficulty.    Time 4    Period Weeks    Status Achieved             OT Long Term Goals - 03/14/20 1133      OT LONG TERM GOAL #1   Title Patient will return to prior level of independence using right arm and hand as dominant with all desired B/IADLs, leisure activities.    Time 8    Period Weeks    Status On-going      OT LONG TERM GOAL #2   Title Patient will improve RUE A/ROM to WNL for improved ability to reach overhead, behind back and use hand to push self out of a chair.    Time 8    Period Weeks    Status On-going      OT LONG TERM GOAL #3   Title Patient will improve right upper extremity strength to 5/5 for improved ability to lift tools and bags of soil when gardening and pick up laundry baskets.    Time 8    Period Weeks    Status On-going      OT LONG TERM GOAL #4  Title Patient will improve right grip strength to 65 pounds and pinch strength to 15 pounds for improved ability to open containers and complete craft projects.    Time 8     Period Weeks    Status On-going      OT LONG TERM GOAL #5   Title Patient will decrease right upper extremity pain to 2/10 or better while completing ADLs, and leisure activities.    Time 8    Period Weeks    Status On-going                 Plan - 03/16/20 1146    Clinical Impression Statement A: Continued with manual techniques to address fascial restrictions, Pt able to tolerate passive stretching WFL today. Continued with A/ROM, added functional reaching task with squigz. Updated HEP for A/ROM. Verbal cuing for form and technique.    Body Structure / Function / Physical Skills ADL;Strength;Pain;UE functional use;ROM;IADL;Fascial restriction;Scar mobility;Wound;Coordination;FMC;Muscle spasms;Skin integrity;Decreased knowledge of precautions    Plan P: Follow up on HEP,continue with functional reaching    OT Home Exercise Plan inital evaluation:  table slides, tendon glides. 7/9 isometrics.; 03/16/20: A/ROM    Consulted and Agree with Plan of Care Patient           Patient will benefit from skilled therapeutic intervention in order to improve the following deficits and impairments:   Body Structure / Function / Physical Skills: ADL, Strength, Pain, UE functional use, ROM, IADL, Fascial restriction, Scar mobility, Wound, Coordination, FMC, Muscle spasms, Skin integrity, Decreased knowledge of precautions       Visit Diagnosis: Acute pain of right shoulder  Other symptoms and signs involving the musculoskeletal system  Stiffness of right shoulder, not elsewhere classified    Problem List Patient Active Problem List   Diagnosis Date Noted  . Traumatic tear of supraspinatus tendon of right shoulder 11/18/2019  . Tear of right infraspinatus tendon 11/18/2019  . Right carpal tunnel syndrome 11/04/2019  . Acute pain of right shoulder 11/04/2019  . Hypertension 04/12/2019  . Glucose intolerance 04/12/2019  . Chronic venous insufficiency 04/06/2019  . Osteopenia  04/06/2019   Guadelupe Sabin, OTR/L  [phone] 03/16/2020, 12:02 PM  [place] [address] [place], [place], [address] Phone: [phone]   Fax:  [phone]  Name: BRAILEE RIEDE MRN: [id number] Date of Birth: 16-Jul-1955

## 2020-03-16 NOTE — Patient Instructions (Signed)

## 2020-03-19 ENCOUNTER — Encounter: Payer: Self-pay | Admitting: Orthopedic Surgery

## 2020-03-19 NOTE — Progress Notes (Signed)
Office Visit Note   Patient: Jessica [name]           Date of Birth: 1955-08-08           MRN: [id number] Visit Date: 03/15/2020 Requested by: Susy Frizzle, MD [address] [place],  [place] [address] PCP: Susy Frizzle, MD  Subjective: Chief Complaint  Patient presents with  . Post-op Follow-up    HPI: Jessica [name] is a 64 year old patient is 3 months out right shoulder biceps tenodesis rotator cuff repair.  She is doing well.  On exam she has improving strength and range of motion.  40 degrees of external rotation 100 g of abduction 140 of forward flexion.  Still cannot wash her hair.  Continue therapy 2 times a week for 4 more weeks for range of motion.  She had carpal tunnel surgery as well and she is doing well with that.  6-week return for final check              ROS: See above  Assessment & Plan: Visit Diagnoses:  1. Traumatic tear of supraspinatus tendon of right shoulder, initial encounter     Plan: See above  Follow-Up Instructions: No follow-ups on file.   Orders:  No orders of the defined types were placed in this encounter.  No orders of the defined types were placed in this encounter.     Procedures: No procedures performed   Clinical Data: No additional findings.  Objective: Vital Signs: There were no vitals taken for this visit.  Physical Exam: See above  Ortho Exam: See above  Specialty Comments:  No specialty comments available.  Imaging: No results found.   PMFS History: Patient Active Problem List   Diagnosis Date Noted  . Traumatic tear of supraspinatus tendon of right shoulder 11/18/2019  . Tear of right infraspinatus tendon 11/18/2019  . Right carpal tunnel syndrome 11/04/2019  . Acute pain of right shoulder 11/04/2019  . Hypertension 04/12/2019  . Glucose intolerance 04/12/2019  . Chronic venous insufficiency 04/06/2019  . Osteopenia 04/06/2019   Past Medical History:  Diagnosis Date  . Anemia    per  patient, had it years ago before hysterectomy years ago  . Arthritis   . Asthma   . Bronchitis   . Carpal tunnel syndrome of right wrist   . Chronic venous insufficiency   . GERD (gastroesophageal reflux disease)   . History of hiatal hernia   . Hypertension   . Pneumonia   . PONV (postoperative nausea and vomiting)     No family history on file.  Past Surgical History:  Procedure Laterality Date  . ABDOMINAL HYSTERECTOMY    . CARPAL TUNNEL RELEASE Right 12/16/2019   Procedure: RIGHT CARPAL TUNNEL RELEASE;  Surgeon: Meredith Pel, MD;  Location: [place];  Service: Orthopedics;  Laterality: Right;  . COLONOSCOPY  06/05/2012   Procedure: COLONOSCOPY;  Surgeon: Rogene Houston, MD;  [facility];  Service: Endoscopy;  Laterality: N/A;  125-changed to 1225 Ann to notify pt  . NISSEN FUNDOPLICATION     x2  . SHOULDER ARTHROSCOPY WITH SUBACROMIAL DECOMPRESSION, ROTATOR CUFF REPAIR AND BICEP TENDON REPAIR Right 12/16/2019   Procedure: RIGHT SHOULDER ARTHROSCOPY, BICEPS TENODESIS, MINI OPEN ROTATOR CUFF TEAR REPAIR, DEBRIDEMENT;  Surgeon: Meredith Pel, MD;  Location: [place];  Service: Orthopedics;  Laterality: Right;   Social History   Occupational History  . Not on file  Tobacco Use  . Smoking status: Never  Smoker  . Smokeless tobacco: Never Used  Vaping Use  . Vaping Use: Never used  Substance and Sexual Activity  . Alcohol use: No  . Drug use: No  . Sexual activity: Not on file

## 2020-03-21 ENCOUNTER — Other Ambulatory Visit: Payer: Self-pay

## 2020-03-21 ENCOUNTER — Ambulatory Visit (HOSPITAL_COMMUNITY): Payer: Medicare Other

## 2020-03-21 ENCOUNTER — Encounter (HOSPITAL_COMMUNITY): Payer: Self-pay

## 2020-03-21 DIAGNOSIS — M25511 Pain in right shoulder: Secondary | ICD-10-CM | POA: Diagnosis not present

## 2020-03-21 DIAGNOSIS — M25531 Pain in right wrist: Secondary | ICD-10-CM

## 2020-03-21 DIAGNOSIS — M25631 Stiffness of right wrist, not elsewhere classified: Secondary | ICD-10-CM

## 2020-03-21 DIAGNOSIS — R29898 Other symptoms and signs involving the musculoskeletal system: Secondary | ICD-10-CM

## 2020-03-21 DIAGNOSIS — M25611 Stiffness of right shoulder, not elsewhere classified: Secondary | ICD-10-CM

## 2020-03-21 NOTE — Therapy (Signed)
[place], [place], [address] Phone: [phone]   Fax:  [phone]  Occupational Therapy Treatment  Patient Details  Name: Jessica [name] MRN: [id number] Date of Birth: 08-18-1955 Referring Provider (OT): Dr. Cheral Bay   Encounter Date: 03/21/2020   OT End of Session - 03/21/20 1122    Visit Number 25    Number of Visits 31    Date for OT Re-Evaluation 04/13/20    Authorization Type Medicare and BCBS    Progress Note Due on Visit 30    OT Start Time 1032    OT Stop Time 1110    OT Time Calculation (min) 38 min    Activity Tolerance Patient tolerated treatment well    Behavior During Therapy [facility] for tasks assessed/performed           Past Medical History:  Diagnosis Date  . Anemia    per patient, had it years ago before hysterectomy years ago  . Arthritis   . Asthma   . Bronchitis   . Carpal tunnel syndrome of right wrist   . Chronic venous insufficiency   . GERD (gastroesophageal reflux disease)   . History of hiatal hernia   . Hypertension   . Pneumonia   . PONV (postoperative nausea and vomiting)     Past Surgical History:  Procedure Laterality Date  . ABDOMINAL HYSTERECTOMY    . CARPAL TUNNEL RELEASE Right 12/16/2019   Procedure: RIGHT CARPAL TUNNEL RELEASE;  Surgeon: Meredith Pel, MD;  Location: [place];  Service: Orthopedics;  Laterality: Right;  . COLONOSCOPY  06/05/2012   Procedure: COLONOSCOPY;  Surgeon: Rogene Houston, MD;  [facility];  Service: Endoscopy;  Laterality: N/A;  125-changed to 1225 Ann to notify pt  . NISSEN FUNDOPLICATION     x2  . SHOULDER ARTHROSCOPY WITH SUBACROMIAL DECOMPRESSION, ROTATOR CUFF REPAIR AND BICEP TENDON REPAIR Right 12/16/2019   Procedure: RIGHT SHOULDER ARTHROSCOPY, BICEPS TENODESIS, MINI OPEN ROTATOR CUFF TEAR REPAIR, DEBRIDEMENT;  Surgeon: Meredith Pel, MD;  Location: [place];  Service: Orthopedics;  Laterality: Right;     There were no vitals filed for this visit.   Subjective Assessment - 03/21/20 1224    Subjective  S: I haven't been able to try out the exercises with everything that has been going on.    Currently in Pain? No/denies                        OT Treatments/Exercises (OP) - 03/21/20 1057      Exercises   Exercises Shoulder      Shoulder Exercises: Supine   Protraction PROM;5 reps    Horizontal ABduction PROM;5 reps    External Rotation PROM;5 reps    Internal Rotation PROM;5 reps    Flexion PROM;5 reps    ABduction PROM;5 reps      Shoulder Exercises: Seated   Other Seated Exercises Seated while using countertop to place shoulder in 90 degrees flexion, patient completed table slide 10X. With shoulder at 90 degrees resting on countertop, pt then completed elbow flexion/extension to stretch the lats 10X      Shoulder Exercises: Standing   Protraction AROM;10 reps    Horizontal ABduction AROM;10 reps    External Rotation AROM;10 reps    Internal Rotation AROM;10 reps    Flexion AROM;10 reps    ABduction AROM;10 reps      Shoulder Exercises: ROM/Strengthening  Other ROM/Strengthening Exercises using green weighted ball completed circles both directions 10X    Other ROM/Strengthening Exercises Resistive clothespins used while seated to increase shoulder mobility; top horizontal pole used. pins removed to focus on shoulder flexion at 90 degrees while attempting to hold elbow up and tapping her right shoulder with pin.                   OT Education - 03/21/20 1225    Education Details Discussed stretches and exercises she may complete at home to work on external rotation in order to wash her hair with less difficulty.    Person(s) Educated Patient    Methods Explanation;Demonstration    Comprehension Verbalized understanding;Returned demonstration            OT Short Term Goals - 03/21/20 1123      OT SHORT TERM GOAL #1   Title Patient will be  educated and independent with HEP for RUE A/ROM and strength.    Time 4    Period Weeks    Target Date 02/02/20      OT SHORT TERM GOAL #2   Title Patient will demonstrate WFL P/ROM in right shoulder, forearm, wrist, and hand in order to improve independence with B/ADLs.    Time 4    Period Weeks      OT SHORT TERM GOAL #3   Title Patient will demonstrate 3+/5 or better strength in her right shoulder, forearm, wrist in order to assist with caring for her mother and complete gardening tasks.    Time 4    Period Weeks    Status Partially Met      OT SHORT TERM GOAL #4   Title Patient will improve right grip and pinch strength by 50% for improved ability to open jars and containers.    Time 4    Period Weeks      OT SHORT TERM GOAL #5   Title Patient will decrease pain to 3/10 or better in her RUE in order to complete ADLs with less difficulty.    Time 4    Period Weeks             OT Long Term Goals - 03/21/20 1123      OT LONG TERM GOAL #1   Title Patient will return to prior level of independence using right arm and hand as dominant with all desired B/IADLs, leisure activities.    Time 8    Period Weeks    Status On-going      OT LONG TERM GOAL #2   Title Patient will improve RUE A/ROM to WNL for improved ability to reach overhead, behind back and use hand to push self out of a chair.    Time 8    Period Weeks    Status On-going      OT LONG TERM GOAL #3   Title Patient will improve right upper extremity strength to 5/5 for improved ability to lift tools and bags of soil when gardening and pick up laundry baskets.    Time 8    Period Weeks    Status On-going      OT LONG TERM GOAL #4   Title Patient will improve right grip strength to 65 pounds and pinch strength to 15 pounds for improved ability to open containers and complete craft projects.    Time 8    Period Weeks    Status On-going      OT LONG TERM  GOAL #5   Title Patient will decrease right upper  extremity pain to 2/10 or better while completing ADLs, and leisure activities.    Time 8    Period Weeks    Status On-going                 Plan - 03/21/20 1226    Clinical Impression Statement A: Patient presents with functional P/ROM although when actively complete shoulder movements she displays evidence of tight latissimus dorsi on the right side. Provided several options of stretches and activities to complete at home to work on increasing shoulder flexion and external rotation in order to wash her hair. VC for form and technique were provided.    Body Structure / Function / Physical Skills ADL;Strength;Pain;UE functional use;ROM;IADL;Fascial restriction;Scar mobility;Wound;Coordination;FMC;Muscle spasms;Skin integrity;Decreased knowledge of precautions    Plan P: Follow uo on A/ROM HEP and shoulder stretches to increase er from previous session. Continue to work on functional reaching, lat stretching to increase functional mobility and external rotation to wash hair.    Consulted and Agree with Plan of Care Patient           Patient will benefit from skilled therapeutic intervention in order to improve the following deficits and impairments:   Body Structure / Function / Physical Skills: ADL, Strength, Pain, UE functional use, ROM, IADL, Fascial restriction, Scar mobility, Wound, Coordination, FMC, Muscle spasms, Skin integrity, Decreased knowledge of precautions       Visit Diagnosis: Acute pain of right shoulder  Other symptoms and signs involving the musculoskeletal system  Stiffness of right shoulder, not elsewhere classified  Stiffness of right wrist, not elsewhere classified  Pain in right wrist    Problem List Patient Active Problem List   Diagnosis Date Noted  . Traumatic tear of supraspinatus tendon of right shoulder 11/18/2019  . Tear of right infraspinatus tendon 11/18/2019  . Right carpal tunnel syndrome 11/04/2019  . Acute pain of right shoulder  11/04/2019  . Hypertension 04/12/2019  . Glucose intolerance 04/12/2019  . Chronic venous insufficiency 04/06/2019  . Osteopenia 04/06/2019   Ailene Ravel, OTR/L,CBIS  [phone]  03/21/2020, 12:35 PM  [facility] [address] [place], [place], [address] Phone: [phone]   Fax:  [phone]  Name: MALEAH RABAGO MRN: [id number] Date of Birth: [birth year]

## 2020-03-23 ENCOUNTER — Ambulatory Visit (HOSPITAL_COMMUNITY): Payer: Medicare Other

## 2020-03-23 ENCOUNTER — Encounter (HOSPITAL_COMMUNITY): Payer: Self-pay

## 2020-03-23 ENCOUNTER — Other Ambulatory Visit: Payer: Self-pay

## 2020-03-23 DIAGNOSIS — M25511 Pain in right shoulder: Secondary | ICD-10-CM

## 2020-03-23 DIAGNOSIS — M25611 Stiffness of right shoulder, not elsewhere classified: Secondary | ICD-10-CM

## 2020-03-23 DIAGNOSIS — R29898 Other symptoms and signs involving the musculoskeletal system: Secondary | ICD-10-CM

## 2020-03-23 NOTE — Therapy (Addendum)
[place], [place], [address] Phone: [phone]   Fax:  [phone]  Occupational Therapy Treatment  Patient Details  Name: Jessica [name] MRN: [id number] Date of Birth: [birth year] Referring Provider (OT): Dr. Cheral Bay   Encounter Date: 03/23/2020   OT End of Session - 03/23/20 1706    Visit Number 26    Number of Visits 31    Date for OT Re-Evaluation 04/13/20    Authorization Type Medicare and BCBS    Progress Note Due on Visit 30    OT Start Time 1649    OT Stop Time 1728    OT Time Calculation (min) 39 min    Activity Tolerance Patient tolerated treatment well    Behavior During Therapy [facility] for tasks assessed/performed           Past Medical History:  Diagnosis Date  . Anemia    per patient, had it years ago before hysterectomy years ago  . Arthritis   . Asthma   . Bronchitis   . Carpal tunnel syndrome of right wrist   . Chronic venous insufficiency   . GERD (gastroesophageal reflux disease)   . History of hiatal hernia   . Hypertension   . Pneumonia   . PONV (postoperative nausea and vomiting)     Past Surgical History:  Procedure Laterality Date  . ABDOMINAL HYSTERECTOMY    . CARPAL TUNNEL RELEASE Right 12/16/2019   Procedure: RIGHT CARPAL TUNNEL RELEASE;  Surgeon: Meredith Pel, MD;  Location: [place];  Service: Orthopedics;  Laterality: Right;  . COLONOSCOPY  06/05/2012   Procedure: COLONOSCOPY;  Surgeon: Rogene Houston, MD;  [facility];  Service: Endoscopy;  Laterality: N/A;  125-changed to 1225 Ann to notify pt  . NISSEN FUNDOPLICATION     x2  . SHOULDER ARTHROSCOPY WITH SUBACROMIAL DECOMPRESSION, ROTATOR CUFF REPAIR AND BICEP TENDON REPAIR Right 12/16/2019   Procedure: RIGHT SHOULDER ARTHROSCOPY, BICEPS TENODESIS, MINI OPEN ROTATOR CUFF TEAR REPAIR, DEBRIDEMENT;  Surgeon: Meredith Pel, MD;  Location: [place];  Service: Orthopedics;  Laterality: Right;     There were no vitals filed for this visit.   Subjective Assessment - 03/23/20 1653    Subjective  S: I still haven't been able to try out the exercises with all the stuff thats going on at home and with life    Currently in Pain? No/denies              [facility] OT Assessment - 03/23/20 1655      Assessment   Medical Diagnosis S/P R RCR, Biceps Tenodesis, and CTR      Precautions   Precautions Shoulder    Type of Shoulder Precautions progress as tolerate for shoulder and wrist.                    OT Treatments/Exercises (OP) - 03/23/20 1655      Exercises   Exercises Shoulder      Shoulder Exercises: Supine   Protraction PROM;5 reps    Horizontal ABduction PROM;5 reps    External Rotation PROM;5 reps    Internal Rotation PROM;5 reps    Flexion PROM;5 reps    ABduction PROM;5 reps      Shoulder Exercises: Seated   Other Seated Exercises Seated while using countertop to place shoulder in 90 degrees flexion, patient completed table slide 10X. With shoulder at 90 degrees resting on countertop, pt then completed  elbow flexion/extension to stretch the lats 10X      Shoulder Exercises: ROM/Strengthening   UBE (Upper Arm Bike) Level 2 3' reverse, pace: 5.0    Over Head Lace 2'    Other ROM/Strengthening Exercises using green weighted ball completed circles both directions 10X      Shoulder Exercises: Stretch   Other Shoulder Stretches Chair lat stretch 30", 2 sets      Functional Reaching Activities   Mid Level Patient placing and removing squigz on door at head height      Manual Therapy   Manual Therapy Soft tissue mobilization    Manual therapy comments manual therapy completed seperately from all other interventions this date    Soft tissue mobilization Soft tissue mobilization completed on upper arm, trapezius and scapularis region to increase mobility in a pain free zone                  OT Education - 03/23/20 1731    Education Details  Discussed solutions for being able to complete exercises even with busy life and home schedule. Provided chair lat stretch as something she can do at home as a quick stretch    Person(s) Educated Patient    Methods Explanation;Demonstration;Verbal cues    Comprehension Verbalized understanding;Returned demonstration;Verbal cues required            OT Short Term Goals - 03/21/20 1123      OT SHORT TERM GOAL #1   Title Patient will be educated and independent with HEP for RUE A/ROM and strength.    Time 4    Period Weeks    Target Date 02/02/20      OT SHORT TERM GOAL #2   Title Patient will demonstrate WFL P/ROM in right shoulder, forearm, wrist, and hand in order to improve independence with B/ADLs.    Time 4    Period Weeks      OT SHORT TERM GOAL #3   Title Patient will demonstrate 3+/5 or better strength in her right shoulder, forearm, wrist in order to assist with caring for her mother and complete gardening tasks.    Time 4    Period Weeks    Status Partially Met      OT SHORT TERM GOAL #4   Title Patient will improve right grip and pinch strength by 50% for improved ability to open jars and containers.    Time 4    Period Weeks      OT SHORT TERM GOAL #5   Title Patient will decrease pain to 3/10 or better in her RUE in order to complete ADLs with less difficulty.    Time 4    Period Weeks             OT Long Term Goals - 03/21/20 1123      OT LONG TERM GOAL #1   Title Patient will return to prior level of independence using right arm and hand as dominant with all desired B/IADLs, leisure activities.    Time 8    Period Weeks    Status On-going      OT LONG TERM GOAL #2   Title Patient will improve RUE A/ROM to WNL for improved ability to reach overhead, behind back and use hand to push self out of a chair.    Time 8    Period Weeks    Status On-going      OT LONG TERM GOAL #3   Title Patient will  improve right upper extremity strength to 5/5 for  improved ability to lift tools and bags of soil when gardening and pick up laundry baskets.    Time 8    Period Weeks    Status On-going      OT LONG TERM GOAL #4   Title Patient will improve right grip strength to 65 pounds and pinch strength to 15 pounds for improved ability to open containers and complete craft projects.    Time 8    Period Weeks    Status On-going      OT LONG TERM GOAL #5   Title Patient will decrease right upper extremity pain to 2/10 or better while completing ADLs, and leisure activities.    Time 8    Period Weeks    Status On-going                 Plan - 03/23/20 1739    Clinical Impression Statement A: Continued with manual techniques to address fascial restrictions in upper arm, trapezius and scapularis region. Patient demonstrated improvements in A/ROM shoulder flexion and ER while standing, with patient reporting that she was able to her RUE to wash her hair yesterday. Completed lat stretches on the chair and on table while seated with verbal cues and demonstration required for form and technique. Completed overhead lacing while seated but was only able to thread , then unthread a 1/4 of the lace before 2' was up.    Body Structure / Function / Physical Skills ADL;Strength;Pain;UE functional use;ROM;IADL;Fascial restriction;Scar mobility;Wound;Coordination;FMC;Muscle spasms;Skin integrity;Decreased knowledge of precautions    OT Treatment/Interventions Self-care/ADL training;Moist Heat;DME and/or AE instruction;Splinting;Therapeutic activities;Ultrasound;Therapeutic exercise;Scar mobilization;Passive range of motion;Neuromuscular education;Cryotherapy;Iontophoresis;Electrical Stimulation;Energy conservation;Manual Therapy;Dry needling;Other (comment)    Plan P: Manual techniques, functional reaching, lat stretching for increased ER.    Consulted and Agree with Plan of Care Patient           Patient will benefit from skilled therapeutic intervention  in order to improve the following deficits and impairments:   Body Structure / Function / Physical Skills: ADL, Strength, Pain, UE functional use, ROM, IADL, Fascial restriction, Scar mobility, Wound, Coordination, FMC, Muscle spasms, Skin integrity, Decreased knowledge of precautions       Visit Diagnosis: Acute pain of right shoulder  Other symptoms and signs involving the musculoskeletal system  Stiffness of right shoulder, not elsewhere classified    Problem List Patient Active Problem List   Diagnosis Date Noted  . Traumatic tear of supraspinatus tendon of right shoulder 11/18/2019  . Tear of right infraspinatus tendon 11/18/2019  . Right carpal tunnel syndrome 11/04/2019  . Acute pain of right shoulder 11/04/2019  . Hypertension 04/12/2019  . Glucose intolerance 04/12/2019  . Chronic venous insufficiency 04/06/2019  . Osteopenia 04/06/2019     [place], [place] Student [phone]   03/23/2020, 6:03 PM  [facility] [address] [place], [place], [address] Phone: [phone]   Fax:  [phone]  Name: Jessica [name] MRN: [id number] Date of Birth: 1955-07-21

## 2020-03-28 ENCOUNTER — Ambulatory Visit (HOSPITAL_COMMUNITY): Payer: Medicare Other | Admitting: Occupational Therapy

## 2020-03-28 ENCOUNTER — Encounter (HOSPITAL_COMMUNITY): Payer: Self-pay | Admitting: Occupational Therapy

## 2020-03-28 ENCOUNTER — Other Ambulatory Visit: Payer: Self-pay

## 2020-03-28 DIAGNOSIS — M25511 Pain in right shoulder: Secondary | ICD-10-CM | POA: Diagnosis not present

## 2020-03-28 DIAGNOSIS — M25611 Stiffness of right shoulder, not elsewhere classified: Secondary | ICD-10-CM

## 2020-03-28 DIAGNOSIS — R29898 Other symptoms and signs involving the musculoskeletal system: Secondary | ICD-10-CM

## 2020-03-28 NOTE — Therapy (Signed)
[place], [place], [address] Phone: [phone]   Fax:  [phone]  Occupational Therapy Treatment  Patient Details  Name: Jessica [name] MRN: [id number] Date of Birth: 1955-08-28 Referring Provider (OT): Dr. Cheral Bay   Encounter Date: 03/28/2020   OT End of Session - 03/28/20 1113    Visit Number 27    Number of Visits 31    Date for OT Re-Evaluation 04/13/20    Authorization Type Medicare and BCBS    Progress Note Due on Visit 30    OT Start Time 1032    OT Stop Time 1113    OT Time Calculation (min) 41 min    Activity Tolerance Patient tolerated treatment well    Behavior During Therapy [facility] for tasks assessed/performed           Past Medical History:  Diagnosis Date  . Anemia    per patient, had it years ago before hysterectomy years ago  . Arthritis   . Asthma   . Bronchitis   . Carpal tunnel syndrome of right wrist   . Chronic venous insufficiency   . GERD (gastroesophageal reflux disease)   . History of hiatal hernia   . Hypertension   . Pneumonia   . PONV (postoperative nausea and vomiting)     Past Surgical History:  Procedure Laterality Date  . ABDOMINAL HYSTERECTOMY    . CARPAL TUNNEL RELEASE Right 12/16/2019   Procedure: RIGHT CARPAL TUNNEL RELEASE;  Surgeon: Meredith Pel, MD;  Location: [place];  Service: Orthopedics;  Laterality: Right;  . COLONOSCOPY  06/05/2012   Procedure: COLONOSCOPY;  Surgeon: Rogene Houston, MD;  [facility];  Service: Endoscopy;  Laterality: N/A;  125-changed to 1225 Ann to notify pt  . NISSEN FUNDOPLICATION     x2  . SHOULDER ARTHROSCOPY WITH SUBACROMIAL DECOMPRESSION, ROTATOR CUFF REPAIR AND BICEP TENDON REPAIR Right 12/16/2019   Procedure: RIGHT SHOULDER ARTHROSCOPY, BICEPS TENODESIS, MINI OPEN ROTATOR CUFF TEAR REPAIR, DEBRIDEMENT;  Surgeon: Meredith Pel, MD;  Location: [place];  Service: Orthopedics;  Laterality: Right;     There were no vitals filed for this visit.   Subjective Assessment - 03/28/20 1033    Subjective  S: I've been trying to do some stuff behind my head.    Currently in Pain? No/denies              [facility] OT Assessment - 03/28/20 1033      Assessment   Medical Diagnosis S/P R RCR, Biceps Tenodesis, and CTR      Precautions   Precautions Shoulder    Type of Shoulder Precautions progress as tolerate for shoulder and wrist.                    OT Treatments/Exercises (OP) - 03/28/20 1044      Exercises   Exercises Shoulder      Shoulder Exercises: Supine   Protraction PROM;5 reps;Strengthening;10 reps    Protraction Weight (lbs) 1    Horizontal ABduction PROM;5 reps;Strengthening;10 reps    Horizontal ABduction Weight (lbs) 1    External Rotation PROM;5 reps;Strengthening;10 reps    External Rotation Weight (lbs) 1    Internal Rotation PROM;5 reps;Strengthening;10 reps    Internal Rotation Weight (lbs) 1    Flexion PROM;5 reps;Strengthening;10 reps    Shoulder Flexion Weight (lbs) 1    ABduction PROM;5 reps;Strengthening;10 reps    Shoulder ABduction Weight (lbs)  1      Shoulder Exercises: Seated   Protraction AROM;15 reps    Horizontal ABduction AROM;15 reps    External Rotation AROM;15 reps    Internal Rotation AROM;15 reps    Flexion AROM;15 reps    Abduction AROM;15 reps      Shoulder Exercises: Therapy Ball   Other Therapy Ball Exercises green therapy ball: chest press, flexion, 10X      Shoulder Exercises: ROM/Strengthening   UBE (Upper Arm Bike) Level 2 3' forward 3' reverse, pace: 7.0    Ball on Wall 1' flexion      Manual Therapy   Manual Therapy Soft tissue mobilization    Manual therapy comments manual therapy completed seperately from all other interventions this date    Soft tissue mobilization Soft tissue mobilization completed on upper arm, trapezius and scapularis region to increase mobility in a pain free zone                     OT Short Term Goals - 03/21/20 1123      OT SHORT TERM GOAL #1   Title Patient will be educated and independent with HEP for RUE A/ROM and strength.    Time 4    Period Weeks    Target Date 02/02/20      OT SHORT TERM GOAL #2   Title Patient will demonstrate WFL P/ROM in right shoulder, forearm, wrist, and hand in order to improve independence with B/ADLs.    Time 4    Period Weeks      OT SHORT TERM GOAL #3   Title Patient will demonstrate 3+/5 or better strength in her right shoulder, forearm, wrist in order to assist with caring for her mother and complete gardening tasks.    Time 4    Period Weeks    Status Partially Met      OT SHORT TERM GOAL #4   Title Patient will improve right grip and pinch strength by 50% for improved ability to open jars and containers.    Time 4    Period Weeks      OT SHORT TERM GOAL #5   Title Patient will decrease pain to 3/10 or better in her RUE in order to complete ADLs with less difficulty.    Time 4    Period Weeks             OT Long Term Goals - 03/21/20 1123      OT LONG TERM GOAL #1   Title Patient will return to prior level of independence using right arm and hand as dominant with all desired B/IADLs, leisure activities.    Time 8    Period Weeks    Status On-going      OT LONG TERM GOAL #2   Title Patient will improve RUE A/ROM to WNL for improved ability to reach overhead, behind back and use hand to push self out of a chair.    Time 8    Period Weeks    Status On-going      OT LONG TERM GOAL #3   Title Patient will improve right upper extremity strength to 5/5 for improved ability to lift tools and bags of soil when gardening and pick up laundry baskets.    Time 8    Period Weeks    Status On-going      OT LONG TERM GOAL #4   Title Patient will improve right grip strength to 65 pounds  and pinch strength to 15 pounds for improved ability to open containers and complete craft projects.    Time 8     Period Weeks    Status On-going      OT LONG TERM GOAL #5   Title Patient will decrease right upper extremity pain to 2/10 or better while completing ADLs, and leisure activities.    Time 8    Period Weeks    Status On-going                 Plan - 03/28/20 1106    Clinical Impression Statement A: Continued with manual techniques to address fascial restrictions in anterior shoulder. Continued with passive stretching and progressed to strengthening using 1# weights in supine. Continued with A/ROM in sitting increasing to 15 repetitions. Added ball on wall in flexion today. Verbal cuing for form and technique.    Body Structure / Function / Physical Skills ADL;Strength;Pain;UE functional use;ROM;IADL;Fascial restriction;Scar mobility;Wound;Coordination;FMC;Muscle spasms;Skin integrity;Decreased knowledge of precautions    Plan P: Manual techniques, functional reaching, attempt x to v arms    OT Home Exercise Plan inital evaluation:  table slides, tendon glides. 7/9 isometrics.; 03/16/20: A/ROM    Consulted and Agree with Plan of Care Patient           Patient will benefit from skilled therapeutic intervention in order to improve the following deficits and impairments:   Body Structure / Function / Physical Skills: ADL, Strength, Pain, UE functional use, ROM, IADL, Fascial restriction, Scar mobility, Wound, Coordination, FMC, Muscle spasms, Skin integrity, Decreased knowledge of precautions       Visit Diagnosis: Acute pain of right shoulder  Other symptoms and signs involving the musculoskeletal system  Stiffness of right shoulder, not elsewhere classified    Problem List Patient Active Problem List   Diagnosis Date Noted  . Traumatic tear of supraspinatus tendon of right shoulder 11/18/2019  . Tear of right infraspinatus tendon 11/18/2019  . Right carpal tunnel syndrome 11/04/2019  . Acute pain of right shoulder 11/04/2019  . Hypertension 04/12/2019  . Glucose  intolerance 04/12/2019  . Chronic venous insufficiency 04/06/2019  . Osteopenia 04/06/2019   Guadelupe Sabin, OTR/L  [phone] 03/28/2020, 11:14 AM  [place], [place], [address] Phone: [phone]   Fax:  [phone]  Name: Jessica [name] MRN: [id number] Date of Birth: Mar 08, 1956

## 2020-03-30 ENCOUNTER — Encounter (HOSPITAL_COMMUNITY): Payer: Self-pay | Admitting: Occupational Therapy

## 2020-03-30 ENCOUNTER — Other Ambulatory Visit: Payer: Self-pay

## 2020-03-30 ENCOUNTER — Ambulatory Visit (HOSPITAL_COMMUNITY): Payer: Medicare Other | Admitting: Occupational Therapy

## 2020-03-30 DIAGNOSIS — M25611 Stiffness of right shoulder, not elsewhere classified: Secondary | ICD-10-CM

## 2020-03-30 DIAGNOSIS — M25511 Pain in right shoulder: Secondary | ICD-10-CM | POA: Diagnosis not present

## 2020-03-30 DIAGNOSIS — R29898 Other symptoms and signs involving the musculoskeletal system: Secondary | ICD-10-CM

## 2020-03-30 NOTE — Therapy (Signed)
[place], [place], [address] Phone: [phone]   Fax:  [phone]  Occupational Therapy Treatment  Patient Details  Name: Jessica [name] MRN: [id number] Date of Birth: 1955-09-17 Referring Provider (OT): Dr. Cheral Bay   Encounter Date: 03/30/2020   OT End of Session - 03/30/20 0938    Visit Number 28    Number of Visits 31    Date for OT Re-Evaluation 04/13/20    Authorization Type Medicare and BCBS    Progress Note Due on Visit 19    OT Start Time 0902    OT Stop Time [phone]   pt requested to leave early to get to a MD appt   OT Time Calculation (min) 35 min    Activity Tolerance Patient tolerated treatment well    Behavior During Therapy [facility] for tasks assessed/performed           Past Medical History:  Diagnosis Date   Anemia    per patient, had it years ago before hysterectomy years ago   Arthritis    Asthma    Bronchitis    Carpal tunnel syndrome of right wrist    Chronic venous insufficiency    GERD (gastroesophageal reflux disease)    History of hiatal hernia    Hypertension    Pneumonia    PONV (postoperative nausea and vomiting)     Past Surgical History:  Procedure Laterality Date   ABDOMINAL HYSTERECTOMY     CARPAL TUNNEL RELEASE Right 12/16/2019   Procedure: RIGHT CARPAL TUNNEL RELEASE;  Surgeon: Meredith Pel, MD;  Location: [place];  Service: Orthopedics;  Laterality: Right;   COLONOSCOPY  06/05/2012   Procedure: COLONOSCOPY;  Surgeon: Rogene Houston, MD;  [facility];  Service: Endoscopy;  Laterality: N/A;  125-changed to 1225 Ann to notify pt   NISSEN FUNDOPLICATION     x2   SHOULDER ARTHROSCOPY WITH SUBACROMIAL DECOMPRESSION, ROTATOR CUFF REPAIR AND BICEP TENDON REPAIR Right 12/16/2019   Procedure: RIGHT SHOULDER ARTHROSCOPY, BICEPS TENODESIS, MINI OPEN ROTATOR CUFF TEAR REPAIR, DEBRIDEMENT;  Surgeon: Meredith Pel, MD;  Location: [place];  Service: Orthopedics;  Laterality: Right;    There were no vitals filed for this visit.   Subjective Assessment - 03/30/20 0903    Subjective  S: It's just a little tight today.    Currently in Pain? No/denies              [facility] OT Assessment - 03/30/20 0855      Assessment   Medical Diagnosis S/P R RCR, Biceps Tenodesis, and CTR      Precautions   Precautions Shoulder    Type of Shoulder Precautions progress as tolerate for shoulder and wrist.                    OT Treatments/Exercises (OP) - 03/30/20 0904      Exercises   Exercises Shoulder      Shoulder Exercises: Supine   Protraction PROM;5 reps;Strengthening;10 reps    Protraction Weight (lbs) 1    Horizontal ABduction PROM;5 reps;Strengthening;10 reps    Horizontal ABduction Weight (lbs) 1    External Rotation PROM;5 reps;Strengthening;10 reps    External Rotation Weight (lbs) 1    Internal Rotation PROM;5 reps;Strengthening;10 reps    Internal Rotation Weight (lbs) 1    Flexion PROM;5 reps;Strengthening;10 reps    Shoulder Flexion Weight (lbs) 1    ABduction PROM;5 reps;Strengthening;10  reps    Shoulder ABduction Weight (lbs) 1      Shoulder Exercises: Seated   Protraction Strengthening;10 reps    Protraction Weight (lbs) 1    Horizontal ABduction Strengthening;10 reps    Horizontal ABduction Weight (lbs) 1    External Rotation Strengthening;10 reps    External Rotation Weight (lbs) 1    Internal Rotation Strengthening;10 reps    Internal Rotation Weight (lbs) 1    Flexion Strengthening;10 reps    Flexion Weight (lbs) 1    Abduction Strengthening;10 reps    ABduction Weight (lbs) 1      Shoulder Exercises: Standing   Extension Theraband;12 reps    Theraband Level (Shoulder Extension) Level 2 (Red)    Row Theraband;12 reps    Theraband Level (Shoulder Row) Level 2 (Red)    Retraction Theraband;12 reps    Theraband Level (Shoulder Retraction) Level 2 (Red)      Shoulder Exercises:  ROM/Strengthening   Over Head Lace 2'    X to V Arms 10X    Proximal Shoulder Strengthening, Supine 10X each, 1#, no rest breaks    Ball on Wall 1' flexion      Shoulder Exercises: Stretch   Internal Rotation Stretch 3 reps    External Rotation Stretch --   10" horizontal towel     Functional Reaching Activities   Mid Level Pt placing 10 cones on middle shelf of overhead cabinet in flexion while wearing 1# wrist weight. Pt then removed wrist weight and removed cones from cabinet while reaching into abduction                    OT Short Term Goals - 03/21/20 1123      OT SHORT TERM GOAL #1   Title Patient will be educated and independent with HEP for RUE A/ROM and strength.    Time 4    Period Weeks    Target Date 02/02/20      OT SHORT TERM GOAL #2   Title Patient will demonstrate WFL P/ROM in right shoulder, forearm, wrist, and hand in order to improve independence with B/ADLs.    Time 4    Period Weeks      OT SHORT TERM GOAL #3   Title Patient will demonstrate 3+/5 or better strength in her right shoulder, forearm, wrist in order to assist with caring for her mother and complete gardening tasks.    Time 4    Period Weeks    Status Partially Met      OT SHORT TERM GOAL #4   Title Patient will improve right grip and pinch strength by 50% for improved ability to open jars and containers.    Time 4    Period Weeks      OT SHORT TERM GOAL #5   Title Patient will decrease pain to 3/10 or better in her RUE in order to complete ADLs with less difficulty.    Time 4    Period Weeks             OT Long Term Goals - 03/21/20 1123      OT LONG TERM GOAL #1   Title Patient will return to prior level of independence using right arm and hand as dominant with all desired B/IADLs, leisure activities.    Time 8    Period Weeks    Status On-going      OT LONG TERM GOAL #2   Title Patient will improve  RUE A/ROM to WNL for improved ability to reach overhead, behind  back and use hand to push self out of a chair.    Time 8    Period Weeks    Status On-going      OT LONG TERM GOAL #3   Title Patient will improve right upper extremity strength to 5/5 for improved ability to lift tools and bags of soil when gardening and pick up laundry baskets.    Time 8    Period Weeks    Status On-going      OT LONG TERM GOAL #4   Title Patient will improve right grip strength to 65 pounds and pinch strength to 15 pounds for improved ability to open containers and complete craft projects.    Time 8    Period Weeks    Status On-going      OT LONG TERM GOAL #5   Title Patient will decrease right upper extremity pain to 2/10 or better while completing ADLs, and leisure activities.    Time 8    Period Weeks    Status On-going                 Plan - 03/30/20 3662    Clinical Impression Statement A: Progressed to strengthening both supine and seated today, pt provided rest breaks as needed for fatigue. Added x to v arms, pt completing sucessfully with max effort. Added 1# wrist weight during functional reaching task, continued with overhead lacing. Pt with mod fatigue at end of session. Verbal cuing for form and technique during session.    Body Structure / Function / Physical Skills ADL;Strength;Pain;UE functional use;ROM;IADL;Fascial restriction;Scar mobility;Wound;Coordination;FMC;Muscle spasms;Skin integrity;Decreased knowledge of precautions    Plan P: Manual techniques as needed, functional reaching, continue working on x to v arms, attempt ball on wall in abduction    OT Home Exercise Plan inital evaluation:  table slides, tendon glides. 7/9 isometrics.; 03/16/20: A/ROM    Consulted and Agree with Plan of Care Patient           Patient will benefit from skilled therapeutic intervention in order to improve the following deficits and impairments:   Body Structure / Function / Physical Skills: ADL, Strength, Pain, UE functional use, ROM, IADL, Fascial  restriction, Scar mobility, Wound, Coordination, FMC, Muscle spasms, Skin integrity, Decreased knowledge of precautions       Visit Diagnosis: Acute pain of right shoulder  Other symptoms and signs involving the musculoskeletal system  Stiffness of right shoulder, not elsewhere classified    Problem List Patient Active Problem List   Diagnosis Date Noted   Traumatic tear of supraspinatus tendon of right shoulder 11/18/2019   Tear of right infraspinatus tendon 11/18/2019   Right carpal tunnel syndrome 11/04/2019   Acute pain of right shoulder 11/04/2019   Hypertension 04/12/2019   Glucose intolerance 04/12/2019   Chronic venous insufficiency 04/06/2019   Osteopenia 04/06/2019   Jessica [name], OTR/L  [id number] 03/30/2020, 9:40 AM  [place], [place], [address] Phone: [phone]   Fax:  [phone]  Name: Jessica [name] MRN: [id number] Date of Birth: Aug 30, 1955

## 2020-04-04 ENCOUNTER — Encounter (HOSPITAL_COMMUNITY): Payer: Self-pay | Admitting: Occupational Therapy

## 2020-04-04 ENCOUNTER — Other Ambulatory Visit: Payer: Self-pay

## 2020-04-04 ENCOUNTER — Ambulatory Visit (HOSPITAL_COMMUNITY): Payer: Medicare Other | Admitting: Occupational Therapy

## 2020-04-04 DIAGNOSIS — M25611 Stiffness of right shoulder, not elsewhere classified: Secondary | ICD-10-CM

## 2020-04-04 DIAGNOSIS — R29898 Other symptoms and signs involving the musculoskeletal system: Secondary | ICD-10-CM

## 2020-04-04 DIAGNOSIS — M25511 Pain in right shoulder: Secondary | ICD-10-CM | POA: Diagnosis not present

## 2020-04-04 NOTE — Therapy (Signed)
[place], [place], [address] Phone: [phone]   Fax:  [phone]  Occupational Therapy Treatment  Patient Details  Name: Jessica [name] MRN: [id number] Date of Birth: February 05, 1956 Referring Provider (OT): Dr. Cheral Bay   Encounter Date: 04/04/2020   OT End of Session - 04/04/20 1340    Visit Number 29    Number of Visits 31    Date for OT Re-Evaluation 04/13/20    Authorization Type Medicare and BCBS    Progress Note Due on Visit 30    OT Start Time 1300    OT Stop Time 1339    OT Time Calculation (min) 39 min    Activity Tolerance Patient tolerated treatment well    Behavior During Therapy [facility] for tasks assessed/performed           Past Medical History:  Diagnosis Date  . Anemia    per patient, had it years ago before hysterectomy years ago  . Arthritis   . Asthma   . Bronchitis   . Carpal tunnel syndrome of right wrist   . Chronic venous insufficiency   . GERD (gastroesophageal reflux disease)   . History of hiatal hernia   . Hypertension   . Pneumonia   . PONV (postoperative nausea and vomiting)     Past Surgical History:  Procedure Laterality Date  . ABDOMINAL HYSTERECTOMY    . CARPAL TUNNEL RELEASE Right 12/16/2019   Procedure: RIGHT CARPAL TUNNEL RELEASE;  Surgeon: Meredith Pel, MD;  Location: [place];  Service: Orthopedics;  Laterality: Right;  . COLONOSCOPY  06/05/2012   Procedure: COLONOSCOPY;  Surgeon: Rogene Houston, MD;  [facility];  Service: Endoscopy;  Laterality: N/A;  125-changed to 1225 Ann to notify pt  . NISSEN FUNDOPLICATION     x2  . SHOULDER ARTHROSCOPY WITH SUBACROMIAL DECOMPRESSION, ROTATOR CUFF REPAIR AND BICEP TENDON REPAIR Right 12/16/2019   Procedure: RIGHT SHOULDER ARTHROSCOPY, BICEPS TENODESIS, MINI OPEN ROTATOR CUFF TEAR REPAIR, DEBRIDEMENT;  Surgeon: Meredith Pel, MD;  Location: [place];  Service: Orthopedics;  Laterality: Right;     There were no vitals filed for this visit.   Subjective Assessment - 04/04/20 1301    Subjective  S: I've been working it a lot.    Currently in Pain? No/denies              [facility] OT Assessment - 04/04/20 1309      Assessment   Medical Diagnosis S/P R RCR, Biceps Tenodesis, and CTR      Precautions   Precautions Shoulder    Type of Shoulder Precautions progress as tolerate for shoulder and wrist.                    OT Treatments/Exercises (OP) - 04/04/20 1301      Exercises   Exercises Shoulder      Shoulder Exercises: Supine   Protraction PROM;5 reps;Strengthening;12 reps    Protraction Weight (lbs) 1    Horizontal ABduction PROM;5 reps;Strengthening;12 reps    Horizontal ABduction Weight (lbs) 1    External Rotation PROM;5 reps;Strengthening;12 reps    External Rotation Weight (lbs) 1    Internal Rotation PROM;5 reps;Strengthening;12 reps    Internal Rotation Weight (lbs) 1    Flexion PROM;5 reps;Strengthening;12 reps    Shoulder Flexion Weight (lbs) 1    ABduction PROM;5 reps;Strengthening;12 reps    Shoulder ABduction Weight (lbs) 1  Shoulder Exercises: Seated   Protraction Strengthening;10 reps    Protraction Weight (lbs) 1    Horizontal ABduction Strengthening;10 reps    Horizontal ABduction Weight (lbs) 1    External Rotation Strengthening;10 reps    External Rotation Weight (lbs) 1    Internal Rotation Strengthening;10 reps    Internal Rotation Weight (lbs) 1    Flexion Strengthening;10 reps    Flexion Weight (lbs) 1    Abduction Strengthening;10 reps    ABduction Weight (lbs) 1    Diagonals Theraband;10 reps    Theraband Level (Shoulder Diagonals) Level 2 (Red)      Shoulder Exercises: Therapy Ball   Other Therapy Ball Exercises green therapy ball: chest press, flexion, 10X      Shoulder Exercises: ROM/Strengthening   Over Head Lace 2'    X to V Arms 10X    Proximal Shoulder Strengthening, Supine 10X each, 1#, no rest breaks     Proximal Shoulder Strengthening, Seated 10X each, 1#, no rest breaks    Ball on Wall 1' flexion                    OT Short Term Goals - 03/21/20 1123      OT SHORT TERM GOAL #1   Title Patient will be educated and independent with HEP for RUE A/ROM and strength.    Time 4    Period Weeks    Target Date 02/02/20      OT SHORT TERM GOAL #2   Title Patient will demonstrate WFL P/ROM in right shoulder, forearm, wrist, and hand in order to improve independence with B/ADLs.    Time 4    Period Weeks      OT SHORT TERM GOAL #3   Title Patient will demonstrate 3+/5 or better strength in her right shoulder, forearm, wrist in order to assist with caring for her mother and complete gardening tasks.    Time 4    Period Weeks    Status Partially Met      OT SHORT TERM GOAL #4   Title Patient will improve right grip and pinch strength by 50% for improved ability to open jars and containers.    Time 4    Period Weeks      OT SHORT TERM GOAL #5   Title Patient will decrease pain to 3/10 or better in her RUE in order to complete ADLs with less difficulty.    Time 4    Period Weeks             OT Long Term Goals - 03/21/20 1123      OT LONG TERM GOAL #1   Title Patient will return to prior level of independence using right arm and hand as dominant with all desired B/IADLs, leisure activities.    Time 8    Period Weeks    Status On-going      OT LONG TERM GOAL #2   Title Patient will improve RUE A/ROM to WNL for improved ability to reach overhead, behind back and use hand to push self out of a chair.    Time 8    Period Weeks    Status On-going      OT LONG TERM GOAL #3   Title Patient will improve right upper extremity strength to 5/5 for improved ability to lift tools and bags of soil when gardening and pick up laundry baskets.    Time 8    Period Weeks  Status On-going      OT LONG TERM GOAL #4   Title Patient will improve right grip strength to 65 pounds  and pinch strength to 15 pounds for improved ability to open containers and complete craft projects.    Time 8    Period Weeks    Status On-going      OT LONG TERM GOAL #5   Title Patient will decrease right upper extremity pain to 2/10 or better while completing ADLs, and leisure activities.    Time 8    Period Weeks    Status On-going                 Plan - 04/04/20 1318    Clinical Impression Statement A: Trace fascial restrictions palpated today, therefore no manual techniques completed. Continued with strengthening in supine and sitting, increasing supine repetitions to 12X each. Continued with x to v arms, pt demonstrates improvement in form and tolerance for task. Continued with therapy ball strengthening and added diagonals with red theraband. Verbal cuing for form and technique.    Body Structure / Function / Physical Skills ADL;Strength;Pain;UE functional use;ROM;IADL;Fascial restriction;Scar mobility;Wound;Coordination;FMC;Muscle spasms;Skin integrity;Decreased knowledge of precautions    Plan P: manual techniques as needed, attempt w arms and ball on wall in abduction    OT Home Exercise Plan inital evaluation:  table slides, tendon glides. 7/9 isometrics.; 03/16/20: A/ROM    Consulted and Agree with Plan of Care Patient           Patient will benefit from skilled therapeutic intervention in order to improve the following deficits and impairments:   Body Structure / Function / Physical Skills: ADL, Strength, Pain, UE functional use, ROM, IADL, Fascial restriction, Scar mobility, Wound, Coordination, FMC, Muscle spasms, Skin integrity, Decreased knowledge of precautions       Visit Diagnosis: Acute pain of right shoulder  Other symptoms and signs involving the musculoskeletal system  Stiffness of right shoulder, not elsewhere classified    Problem List Patient Active Problem List   Diagnosis Date Noted  . Traumatic tear of supraspinatus tendon of right  shoulder 11/18/2019  . Tear of right infraspinatus tendon 11/18/2019  . Right carpal tunnel syndrome 11/04/2019  . Acute pain of right shoulder 11/04/2019  . Hypertension 04/12/2019  . Glucose intolerance 04/12/2019  . Chronic venous insufficiency 04/06/2019  . Osteopenia 04/06/2019   Guadelupe Sabin, OTR/L  [phone] 04/04/2020, 1:42 PM  [place] [address] [place], [place], [address] Phone: [phone]   Fax:  [phone]  Name: Jessica [name] MRN: [id number] Date of Birth: Nov 17, 1955

## 2020-04-06 ENCOUNTER — Ambulatory Visit (HOSPITAL_COMMUNITY): Payer: Medicare Other | Admitting: Occupational Therapy

## 2020-04-06 ENCOUNTER — Other Ambulatory Visit: Payer: Self-pay

## 2020-04-06 ENCOUNTER — Encounter (HOSPITAL_COMMUNITY): Payer: Self-pay | Admitting: Occupational Therapy

## 2020-04-06 DIAGNOSIS — M25511 Pain in right shoulder: Secondary | ICD-10-CM | POA: Diagnosis not present

## 2020-04-06 DIAGNOSIS — M25611 Stiffness of right shoulder, not elsewhere classified: Secondary | ICD-10-CM

## 2020-04-06 DIAGNOSIS — R29898 Other symptoms and signs involving the musculoskeletal system: Secondary | ICD-10-CM

## 2020-04-06 NOTE — Therapy (Addendum)
[place], [place], [address] Phone: [phone]   Fax:  [phone]  Occupational Therapy Treatment  Patient Details  Name: Jessica [name] MRN: [id number] Date of Birth: April 26, 1956 Referring Provider (OT): Dr. Cheral Bay  Progress Note Reporting Period 02/24/2020 to 04/06/2020  See note below for Objective Data and Assessment of Progress/Goals.       Encounter Date: 04/06/2020   OT End of Session - 04/06/20 1201    Visit Number 30    Number of Visits 31    Date for OT Re-Evaluation 04/13/20    Authorization Type Medicare and BCBS    Progress Note Due on Visit 30    OT Start Time 1122    OT Stop Time 1200    OT Time Calculation (min) 38 min    Activity Tolerance Patient tolerated treatment well    Behavior During Therapy WFL for tasks assessed/performed           Past Medical History:  Diagnosis Date  . Anemia    per patient, had it years ago before hysterectomy years ago  . Arthritis   . Asthma   . Bronchitis   . Carpal tunnel syndrome of right wrist   . Chronic venous insufficiency   . GERD (gastroesophageal reflux disease)   . History of hiatal hernia   . Hypertension   . Pneumonia   . PONV (postoperative nausea and vomiting)     Past Surgical History:  Procedure Laterality Date  . ABDOMINAL HYSTERECTOMY    . CARPAL TUNNEL RELEASE Right 12/16/2019   Procedure: RIGHT CARPAL TUNNEL RELEASE;  Surgeon: Meredith Pel, MD;  Location: [facility];  Service: Orthopedics;  Laterality: Right;  . COLONOSCOPY  06/05/2012   Procedure: COLONOSCOPY;  Surgeon: Rogene Houston, MD;  [facility];  Service: Endoscopy;  Laterality: N/A;  125-changed to 1225 Ann to notify pt  . NISSEN FUNDOPLICATION     x2  . SHOULDER ARTHROSCOPY WITH SUBACROMIAL DECOMPRESSION, ROTATOR CUFF REPAIR AND BICEP TENDON REPAIR Right 12/16/2019   Procedure: RIGHT SHOULDER ARTHROSCOPY, BICEPS TENODESIS, MINI OPEN  ROTATOR CUFF TEAR REPAIR, DEBRIDEMENT;  Surgeon: Meredith Pel, MD;  Location: [facility];  Service: Orthopedics;  Laterality: Right;    There were no vitals filed for this visit.   Subjective Assessment - 04/06/20 1124    Subjective  S: I got wet and I think that made it start aching.    Currently in Pain? Yes    Pain Score 2     Pain Location Shoulder    Pain Orientation Right    Pain Descriptors / Indicators Aching    Pain Type Acute pain    Pain Radiating Towards N/A    Pain Onset More than a month ago    Pain Frequency Occasional    Aggravating Factors  movement    Pain Relieving Factors rest    Effect of Pain on Daily Activities min effect on ADLs    Multiple Pain Sites No              [facility] OT Assessment - 04/06/20 1124      Assessment   Medical Diagnosis S/P R RCR, Biceps Tenodesis, and CTR      Precautions   Precautions Shoulder    Type of Shoulder Precautions progress as tolerate for shoulder and wrist.  OT Treatments/Exercises (OP) - 04/06/20 1125      Exercises   Exercises Shoulder      Shoulder Exercises: Supine   Protraction PROM;5 reps;Strengthening;12 reps    Protraction Weight (lbs) 1    Horizontal ABduction PROM;5 reps;Strengthening;12 reps    Horizontal ABduction Weight (lbs) 1    External Rotation PROM;5 reps;Strengthening;12 reps    External Rotation Weight (lbs) 1    Internal Rotation PROM;5 reps;Strengthening;12 reps    Internal Rotation Weight (lbs) 1    Flexion PROM;5 reps;Strengthening;12 reps    Shoulder Flexion Weight (lbs) 1    ABduction PROM;5 reps;Strengthening;12 reps    Shoulder ABduction Weight (lbs) 1      Shoulder Exercises: Seated   Protraction Strengthening;12 reps    Protraction Weight (lbs) 1    Horizontal ABduction Strengthening;12 reps;Theraband;10 reps    Theraband Level (Shoulder Horizontal ABduction) Level 2 (Red)    Horizontal ABduction Weight (lbs) 1    External Rotation  Strengthening;12 reps;Theraband;10 reps    Theraband Level (Shoulder External Rotation) Level 2 (Red)    External Rotation Weight (lbs) 1    Internal Rotation Strengthening;12 reps    Internal Rotation Weight (lbs) 1    Flexion Strengthening;12 reps    Flexion Weight (lbs) 1    Abduction Strengthening;12 reps    ABduction Weight (lbs) 1    Diagonals Theraband;10 reps    Theraband Level (Shoulder Diagonals) Level 2 (Red)      Shoulder Exercises: ROM/Strengthening   Over Head Lace 2'    X to V Arms 10X    Proximal Shoulder Strengthening, Supine 10X each, 1#, no rest breaks    Proximal Shoulder Strengthening, Seated 10X each, 1#, 1 rest breaks    Ball on Wall 1' flexion 1' abduction                    OT Short Term Goals - 03/21/20 1123      OT SHORT TERM GOAL #1   Title Patient will be educated and independent with HEP for RUE A/ROM and strength.    Time 4    Period Weeks    Target Date 02/02/20      OT SHORT TERM GOAL #2   Title Patient will demonstrate WFL P/ROM in right shoulder, forearm, wrist, and hand in order to improve independence with B/ADLs.    Time 4    Period Weeks      OT SHORT TERM GOAL #3   Title Patient will demonstrate 3+/5 or better strength in her right shoulder, forearm, wrist in order to assist with caring for her mother and complete gardening tasks.    Time 4    Period Weeks    Status Partially Met      OT SHORT TERM GOAL #4   Title Patient will improve right grip and pinch strength by 50% for improved ability to open jars and containers.    Time 4    Period Weeks      OT SHORT TERM GOAL #5   Title Patient will decrease pain to 3/10 or better in her RUE in order to complete ADLs with less difficulty.    Time 4    Period Weeks             OT Long Term Goals - 03/21/20 1123      OT LONG TERM GOAL #1   Title Patient will return to prior level of independence using right arm and hand as dominant  with all desired B/IADLs, leisure  activities.    Time 8    Period Weeks    Status On-going      OT LONG TERM GOAL #2   Title Patient will improve RUE A/ROM to WNL for improved ability to reach overhead, behind back and use hand to push self out of a chair.    Time 8    Period Weeks    Status On-going      OT LONG TERM GOAL #3   Title Patient will improve right upper extremity strength to 5/5 for improved ability to lift tools and bags of soil when gardening and pick up laundry baskets.    Time 8    Period Weeks    Status On-going      OT LONG TERM GOAL #4   Title Patient will improve right grip strength to 65 pounds and pinch strength to 15 pounds for improved ability to open containers and complete craft projects.    Time 8    Period Weeks    Status On-going      OT LONG TERM GOAL #5   Title Patient will decrease right upper extremity pain to 2/10 or better while completing ADLs, and leisure activities.    Time 8    Period Weeks    Status On-going                 Plan - 04/06/20 1149    Clinical Impression Statement A: Continued with RUE strengthening and stability exercises today. Increased seated strengthening repetitions to 12, added red theraband strengthening and continued with x to v arms and ball on the wall. Added ball on the wall in abduction. Verbal cuing for form and technique, rest breaks provided as needed for fatigue.    Body Structure / Function / Physical Skills ADL;Strength;Pain;UE functional use;ROM;IADL;Fascial restriction;Scar mobility;Wound;Coordination;FMC;Muscle spasms;Skin integrity;Decreased knowledge of precautions    Plan P: continue with red theraband strengthening in preparation for discharge next session    OT Home Exercise Plan inital evaluation:  table slides, tendon glides. 7/9 isometrics.; 03/16/20: A/ROM    Consulted and Agree with Plan of Care Patient           Patient will benefit from skilled therapeutic intervention in order to improve the following deficits and  impairments:   Body Structure / Function / Physical Skills: ADL, Strength, Pain, UE functional use, ROM, IADL, Fascial restriction, Scar mobility, Wound, Coordination, FMC, Muscle spasms, Skin integrity, Decreased knowledge of precautions       Visit Diagnosis: Acute pain of right shoulder  Other symptoms and signs involving the musculoskeletal system  Stiffness of right shoulder, not elsewhere classified    Problem List Patient Active Problem List   Diagnosis Date Noted  . Traumatic tear of supraspinatus tendon of right shoulder 11/18/2019  . Tear of right infraspinatus tendon 11/18/2019  . Right carpal tunnel syndrome 11/04/2019  . Acute pain of right shoulder 11/04/2019  . Hypertension 04/12/2019  . Glucose intolerance 04/12/2019  . Chronic venous insufficiency 04/06/2019  . Osteopenia 04/06/2019   Guadelupe Sabin, OTR/L  [phone] 04/06/2020, 12:03 PM  [facility] [address] [place], [place], [address] Phone: [phone]   Fax:  [phone]  Name: Jessica [name] MRN: [id number] Date of Birth: 1956-01-25

## 2020-04-11 ENCOUNTER — Encounter (HOSPITAL_COMMUNITY): Payer: Self-pay | Admitting: Occupational Therapy

## 2020-04-11 ENCOUNTER — Ambulatory Visit (HOSPITAL_COMMUNITY): Payer: Medicare Other | Admitting: Occupational Therapy

## 2020-04-11 ENCOUNTER — Other Ambulatory Visit: Payer: Self-pay

## 2020-04-11 DIAGNOSIS — M25511 Pain in right shoulder: Secondary | ICD-10-CM | POA: Diagnosis not present

## 2020-04-11 DIAGNOSIS — R29898 Other symptoms and signs involving the musculoskeletal system: Secondary | ICD-10-CM

## 2020-04-11 DIAGNOSIS — M25611 Stiffness of right shoulder, not elsewhere classified: Secondary | ICD-10-CM

## 2020-04-11 NOTE — Therapy (Signed)
[place], [place], [address] Phone: [phone]   Fax:  [phone]  Occupational Therapy Treatment  Patient Details  Name: Jessica [name] MRN: [id number] Date of Birth: 08-Dec-1955 Referring Provider (OT): Dr. Cheral Bay   Encounter Date: 04/11/2020   OT End of Session - 04/11/20 1114    Visit Number 31    Number of Visits 100    Date for OT Re-Evaluation 04/13/20    Authorization Type Medicare and BCBS    Progress Note Due on Visit 45    OT Start Time 1029    OT Stop Time 1113    OT Time Calculation (min) 44 min    Activity Tolerance Patient tolerated treatment well    Behavior During Therapy [facility] for tasks assessed/performed           Past Medical History:  Diagnosis Date   Anemia    per patient, had it years ago before hysterectomy years ago   Arthritis    Asthma    Bronchitis    Carpal tunnel syndrome of right wrist    Chronic venous insufficiency    GERD (gastroesophageal reflux disease)    History of hiatal hernia    Hypertension    Pneumonia    PONV (postoperative nausea and vomiting)     Past Surgical History:  Procedure Laterality Date   ABDOMINAL HYSTERECTOMY     CARPAL TUNNEL RELEASE Right 12/16/2019   Procedure: RIGHT CARPAL TUNNEL RELEASE;  Surgeon: Meredith Pel, MD;  Location: [facility];  Service: Orthopedics;  Laterality: Right;   COLONOSCOPY  06/05/2012   Procedure: COLONOSCOPY;  Surgeon: Rogene Houston, MD;  [facility];  Service: Endoscopy;  Laterality: N/A;  125-changed to 1225 Ann to notify pt   NISSEN FUNDOPLICATION     x2   SHOULDER ARTHROSCOPY WITH SUBACROMIAL DECOMPRESSION, ROTATOR CUFF REPAIR AND BICEP TENDON REPAIR Right 12/16/2019   Procedure: RIGHT SHOULDER ARTHROSCOPY, BICEPS TENODESIS, MINI OPEN ROTATOR CUFF TEAR REPAIR, DEBRIDEMENT;  Surgeon: Meredith Pel, MD;  Location: [facility];  Service: Orthopedics;  Laterality: Right;     There were no vitals filed for this visit.   Subjective Assessment - 04/11/20 1032    Subjective  S: It's just a little stiff today.    Currently in Pain? No/denies              [facility] OT Assessment - 04/11/20 1031      Assessment   Medical Diagnosis S/P R RCR, Biceps Tenodesis, and CTR      Precautions   Precautions Shoulder    Type of Shoulder Precautions progress as tolerate for shoulder and wrist.                    OT Treatments/Exercises (OP) - 04/11/20 1032      Exercises   Exercises Shoulder      Shoulder Exercises: Supine   Protraction PROM;5 reps;Strengthening;10 reps    Protraction Weight (lbs) 2    Horizontal ABduction PROM;5 reps;Strengthening;10 reps    Horizontal ABduction Weight (lbs) 2    External Rotation PROM;5 reps;Strengthening;10 reps    External Rotation Weight (lbs) 2    Internal Rotation PROM;5 reps;Strengthening;10 reps    Internal Rotation Weight (lbs) 2    Flexion PROM;5 reps;Strengthening;10 reps    Shoulder Flexion Weight (lbs) 2    ABduction PROM;5 reps;Strengthening;10 reps    Shoulder ABduction Weight (lbs) 2  Shoulder Exercises: Seated   Protraction Strengthening;12 reps    Protraction Weight (lbs) 1    Horizontal ABduction Strengthening;12 reps;Theraband;10 reps    Theraband Level (Shoulder Horizontal ABduction) Level 2 (Red)    Horizontal ABduction Weight (lbs) 1    External Rotation Strengthening;12 reps;Theraband;10 reps    Theraband Level (Shoulder External Rotation) Level 2 (Red)    External Rotation Weight (lbs) 1    Internal Rotation Strengthening;12 reps    Internal Rotation Weight (lbs) 1    Flexion Strengthening;12 reps    Flexion Weight (lbs) 1    Abduction Strengthening;12 reps    ABduction Weight (lbs) 1    Diagonals Theraband;10 reps    Theraband Level (Shoulder Diagonals) Level 2 (Red)      Shoulder Exercises: Therapy Ball   Other Therapy Ball Exercises green therapy ball: chest press,  flexion, circles 10X each      Shoulder Exercises: ROM/Strengthening   UBE (Upper Arm Bike) Level 2 3' forward 3' reverse, pace: 7.0    X to V Arms 10X, 1#     Proximal Shoulder Strengthening, Supine 10X each, 2#, no rest breaks    Proximal Shoulder Strengthening, Seated 10X each, 1#, 1 rest break    Ball on Wall 1' flexion 1' abduction                    OT Short Term Goals - 03/21/20 1123      OT SHORT TERM GOAL #1   Title Patient will be educated and independent with HEP for RUE A/ROM and strength.    Time 4    Period Weeks    Target Date 02/02/20      OT SHORT TERM GOAL #2   Title Patient will demonstrate WFL P/ROM in right shoulder, forearm, wrist, and hand in order to improve independence with B/ADLs.    Time 4    Period Weeks      OT SHORT TERM GOAL #3   Title Patient will demonstrate 3+/5 or better strength in her right shoulder, forearm, wrist in order to assist with caring for her mother and complete gardening tasks.    Time 4    Period Weeks    Status Partially Met      OT SHORT TERM GOAL #4   Title Patient will improve right grip and pinch strength by 50% for improved ability to open jars and containers.    Time 4    Period Weeks      OT SHORT TERM GOAL #5   Title Patient will decrease pain to 3/10 or better in her RUE in order to complete ADLs with less difficulty.    Time 4    Period Weeks             OT Long Term Goals - 03/21/20 1123      OT LONG TERM GOAL #1   Title Patient will return to prior level of independence using right arm and hand as dominant with all desired B/IADLs, leisure activities.    Time 8    Period Weeks    Status On-going      OT LONG TERM GOAL #2   Title Patient will improve RUE A/ROM to WNL for improved ability to reach overhead, behind back and use hand to push self out of a chair.    Time 8    Period Weeks    Status On-going      OT LONG TERM GOAL #3  Title Patient will improve right upper extremity  strength to 5/5 for improved ability to lift tools and bags of soil when gardening and pick up laundry baskets.    Time 8    Period Weeks    Status On-going      OT LONG TERM GOAL #4   Title Patient will improve right grip strength to 65 pounds and pinch strength to 15 pounds for improved ability to open containers and complete craft projects.    Time 8    Period Weeks    Status On-going      OT LONG TERM GOAL #5   Title Patient will decrease right upper extremity pain to 2/10 or better while completing ADLs, and leisure activities.    Time 8    Period Weeks    Status On-going                 Plan - 04/11/20 1055    Clinical Impression Statement A: Continued with focus on RUE strengthening and stability, progressing to 2# weights in supine. Pt improving with form and activity tolerance during all exercises. Also added weight to x to v arms today. Continued with ball on the wall and resumed therapy ball strengthening. Verbal cuing for form and technique.    Body Structure / Function / Physical Skills ADL;Strength;Pain;UE functional use;ROM;IADL;Fascial restriction;Scar mobility;Wound;Coordination;FMC;Muscle spasms;Skin integrity;Decreased knowledge of precautions    Plan P: Reassessment, update HEP, discharge    OT Home Exercise Plan inital evaluation:  table slides, tendon glides. 7/9 isometrics.; 03/16/20: A/ROM    Consulted and Agree with Plan of Care Patient           Patient will benefit from skilled therapeutic intervention in order to improve the following deficits and impairments:   Body Structure / Function / Physical Skills: ADL, Strength, Pain, UE functional use, ROM, IADL, Fascial restriction, Scar mobility, Wound, Coordination, FMC, Muscle spasms, Skin integrity, Decreased knowledge of precautions       Visit Diagnosis: Acute pain of right shoulder  Other symptoms and signs involving the musculoskeletal system  Stiffness of right shoulder, not elsewhere  classified    Problem List Patient Active Problem List   Diagnosis Date Noted   Traumatic tear of supraspinatus tendon of right shoulder 11/18/2019   Tear of right infraspinatus tendon 11/18/2019   Right carpal tunnel syndrome 11/04/2019   Acute pain of right shoulder 11/04/2019   Hypertension 04/12/2019   Glucose intolerance 04/12/2019   Chronic venous insufficiency 04/06/2019   Osteopenia 04/06/2019   Guadelupe Sabin, OTR/L  [phone] 04/11/2020, 11:15 AM  [place], [place], [address] Phone: [phone]   Fax:  [phone]  Name: Jessica [name] MRN: [id number] Date of Birth: [birth year]

## 2020-04-13 ENCOUNTER — Encounter (HOSPITAL_COMMUNITY): Payer: Self-pay | Admitting: Specialist

## 2020-04-13 ENCOUNTER — Other Ambulatory Visit: Payer: Self-pay

## 2020-04-13 ENCOUNTER — Ambulatory Visit (HOSPITAL_COMMUNITY): Payer: Medicare Other | Admitting: Specialist

## 2020-04-13 DIAGNOSIS — R29898 Other symptoms and signs involving the musculoskeletal system: Secondary | ICD-10-CM

## 2020-04-13 DIAGNOSIS — M25511 Pain in right shoulder: Secondary | ICD-10-CM | POA: Diagnosis not present

## 2020-04-13 DIAGNOSIS — M25611 Stiffness of right shoulder, not elsewhere classified: Secondary | ICD-10-CM

## 2020-04-13 NOTE — Patient Instructions (Signed)
Resisted Shoulder Flexion: Bilateral    Face anchor, tubing end in each hand. With arms straight out in front, pinch shoulder blades together and raise arms over head. Repeat ____ times per set. Do ____ sets per session. Do ____ sessions per day.  http://orth.exer.us/965   Copyright  VHI. All rights reserved.  1) Strengthening: Chest Pull - Resisted   Hold Theraband in front of body with hands about shoulder width a part. Pull band a part and back together slowly. Repeat ____ times. Complete ____ set(s) per session.. Repeat ____ session(s) per day.  http://orth.exer.us/926   Copyright  VHI. All rights reserved.   2) PNF Strengthening: Resisted   Standing with resistive band around each hand, bring right arm up and away, thumb back. Repeat ____ times per set. Do ____ sets per session. Do ____ sessions per day.                           3) Resisted External Rotation: in Neutral - Bilateral   Sit or stand, tubing in both hands, elbows at sides, bent to 90, forearms forward. Pinch shoulder blades together and rotate forearms out. Keep elbows at sides. Repeat ____ times per set. Do ____ sets per session. Do ____ sessions per day.  http://orth.exer.us/966   Copyright  VHI. All rights reserved.   4) PNF Strengthening: Resisted   Standing, hold resistive band above head. Bring right arm down and out from side. Repeat ____ times per set. Do ____ sets per session. Do ____ sessions per day.  http://orth.exer.us/922   Copyright  VHI. All rights reserved.

## 2020-04-13 NOTE — Therapy (Signed)
[place], [place], [address] Phone: [phone]   Fax:  [phone]  Occupational Therapy Treatment  Patient Details  Name: Jessica [name] MRN: [id number] Date of Birth: 06-23-1956 Referring Provider (OT): Dr. Cheral Bay   Encounter Date: 04/13/2020   OT End of Session - 04/13/20 1501    Visit Number 32    Number of Visits 32    Date for OT Re-Evaluation 04/13/20    Authorization Type Medicare and BCBS    Progress Note Due on Visit 7    OT Start Time 1120    OT Stop Time 1200    OT Time Calculation (min) 40 min    Activity Tolerance Patient tolerated treatment well           Past Medical History:  Diagnosis Date  . Anemia    per patient, had it years ago before hysterectomy years ago  . Arthritis   . Asthma   . Bronchitis   . Carpal tunnel syndrome of right wrist   . Chronic venous insufficiency   . GERD (gastroesophageal reflux disease)   . History of hiatal hernia   . Hypertension   . Pneumonia   . PONV (postoperative nausea and vomiting)     Past Surgical History:  Procedure Laterality Date  . ABDOMINAL HYSTERECTOMY    . CARPAL TUNNEL RELEASE Right 12/16/2019   Procedure: RIGHT CARPAL TUNNEL RELEASE;  Surgeon: Meredith Pel, MD;  Location: [facility];  Service: Orthopedics;  Laterality: Right;  . COLONOSCOPY  06/05/2012   Procedure: COLONOSCOPY;  Surgeon: Rogene [place], MD;  [facility];  Service: Endoscopy;  Laterality: N/A;  125-changed to 1225 Ann to notify pt  . NISSEN FUNDOPLICATION     x2  . SHOULDER ARTHROSCOPY WITH SUBACROMIAL DECOMPRESSION, ROTATOR CUFF REPAIR AND BICEP TENDON REPAIR Right 12/16/2019   Procedure: RIGHT SHOULDER ARTHROSCOPY, BICEPS TENODESIS, MINI OPEN ROTATOR CUFF TEAR REPAIR, DEBRIDEMENT;  Surgeon: Meredith Pel, MD;  Location: [facility];  Service: Orthopedics;  Laterality: Right;    There were no vitals filed for this visit.   Subjective  Assessment - 04/13/20 1459    Subjective  S:  I know it needs to get stronger, but I am pleased with how I am doing.    Currently in Pain? No/denies              [facility] OT Assessment - 04/13/20 0001      Assessment   Medical Diagnosis S/P R RCR, Biceps Tenodesis, and CTR    Referring Provider (OT) Dr. Cheral Bay      Precautions   Precautions Shoulder    Type of Shoulder Precautions progress as tolerate for shoulder and wrist.      Observation/Other Assessments   Focus on Therapeutic Outcomes (FOTO)  61% independent       Palpation   Palpation comment trace fascial restrictions       AROM   Overall AROM Comments Assessed seated, er/IR adducted    Right/Left Shoulder Right    Right Shoulder Flexion 160 Degrees   110   Right Shoulder ABduction 160 Degrees   120   Right Shoulder Internal Rotation 90 Degrees   70   Right Shoulder External Rotation 65 Degrees   59     PROM   Overall PROM Comments WNL in supine       Strength   Overall Strength Comments Assessed seated, er/IR adducted  Right Shoulder Flexion 4-/5   3-/5   Right Shoulder ABduction 4-/5   3/5   Right Shoulder Internal Rotation 5/5   5/5   Right Shoulder External Rotation 5/5   4+/5     Hand Function   Right Hand Grip (lbs) 63    Right Hand Lateral Pinch 16 lbs    Right Hand 3 Point Pinch 10 lbs    Left Hand Grip (lbs) 75    Left Hand Lateral Pinch 18 lbs    Left 3 point pinch 15 lbs                    OT Treatments/Exercises (OP) - 04/13/20 0001      Shoulder Exercises: Seated   Other Seated Exercises seated red theraband:  flexion, PNF 1, PNF 2, horizontal abduction, external rotation BUE 10 times each       Manual Therapy   Manual Therapy Myofascial release    Manual therapy comments manual therapy completed seperately from all other interventions this date    Myofascial Release myofascial release and manual stretching to decrease pain and fascial restrictions and improve pain  free mobility in right shoulder, upper arm, scapular, and shoulder region, and wrist region.                    OT Education - 04/13/20 1500    Education Details reviewed progress toawards goals, updated theraband to green theraband and added shoulder flexion, pnf 1 and 2, horizontal abduction and external rotation    Person(s) Educated Patient    Comprehension Verbalized understanding;Returned demonstration;Verbal cues required            OT Short Term Goals - 04/13/20 1503      OT SHORT TERM GOAL #1   Title Patient will be educated and independent with HEP for RUE A/ROM and strength.    Time 4    Period Weeks    Target Date 02/02/20      OT SHORT TERM GOAL #2   Title Patient will demonstrate WFL P/ROM in right shoulder, forearm, wrist, and hand in order to improve independence with B/ADLs.    Time 4    Period Weeks      OT SHORT TERM GOAL #3   Title Patient will demonstrate 3+/5 or better strength in her right shoulder, forearm, wrist in order to assist with caring for her mother and complete gardening tasks.    Time 4    Period Weeks    Status Partially Met      OT SHORT TERM GOAL #4   Title Patient will improve right grip and pinch strength by 50% for improved ability to open jars and containers.    Time 4    Period Weeks      OT SHORT TERM GOAL #5   Title Patient will decrease pain to 3/10 or better in her RUE in order to complete ADLs with less difficulty.    Time 4    Period Weeks             OT Long Term Goals - 04/13/20 1503      OT LONG TERM GOAL #1   Title Patient will return to prior level of independence using right arm and hand as dominant with all desired B/IADLs, leisure activities.    Time 8    Period Weeks    Status Achieved      OT LONG TERM GOAL #2   Title  Patient will improve RUE A/ROM to WNL for improved ability to reach overhead, behind back and use hand to push self out of a chair.    Time 8    Period Weeks    Status Achieved       OT LONG TERM GOAL #3   Title Patient will improve right upper extremity strength to 5/5 for improved ability to lift tools and bags of soil when gardening and pick up laundry baskets.    Time 8    Period Weeks    Status Partially Met      OT LONG TERM GOAL #4   Title Patient will improve right grip strength to 65 pounds and pinch strength to 15 pounds for improved ability to open containers and complete craft projects.    Time 8    Period Weeks    Status Achieved      OT LONG TERM GOAL #5   Title Patient will decrease right upper extremity pain to 2/10 or better while completing ADLs, and leisure activities.    Time 8    Period Weeks    Status Achieved                 Plan - 04/13/20 1502    Clinical Impression Statement A:  patient has met or made progress towards all OT goals.  Updated HEP to mod resist green theraband and added PNF, flexion, horizontal abduction and external rotaiton with theraband to HEP.    Body Structure / Function / Physical Skills ADL;Strength;Pain;UE functional use;ROM;IADL;Fascial restriction;Scar mobility;Wound;Coordination;FMC;Muscle spasms;Skin integrity;Decreased knowledge of precautions    Plan P:  DC From skilled OT intervention this date.           Patient will benefit from skilled therapeutic intervention in order to improve the following deficits and impairments:   Body Structure / Function / Physical Skills: ADL, Strength, Pain, UE functional use, ROM, IADL, Fascial restriction, Scar mobility, Wound, Coordination, FMC, Muscle spasms, Skin integrity, Decreased knowledge of precautions       Visit Diagnosis: Acute pain of right shoulder  Other symptoms and signs involving the musculoskeletal system  Stiffness of right shoulder, not elsewhere classified    Problem List Patient Active Problem List   Diagnosis Date Noted  . Traumatic tear of supraspinatus tendon of right shoulder 11/18/2019  . Tear of right infraspinatus  tendon 11/18/2019  . Right carpal tunnel syndrome 11/04/2019  . Acute pain of right shoulder 11/04/2019  . Hypertension 04/12/2019  . Glucose intolerance 04/12/2019  . Chronic venous insufficiency 04/06/2019  . Osteopenia 04/06/2019    Vangie Bicker, MHA, OTR/L [phone] OCCUPATIONAL THERAPY DISCHARGE SUMMARY  Visits from Start of Care: 32  Current functional level related to goals / functional outcomes: See above    Remaining deficits: 4-/5 strength in flexion and abduction   Education / Equipment: Strengthening HEP   Plan: Patient agrees to discharge.  Patient goals were met. Patient is being discharged due to meeting the stated rehab goals.  ?????    Vangie Bicker, [place], OTR/L [phone]   04/13/2020, 3:11 PM  [facility] [address] [place], [place], [address] Phone: [phone]   Fax:  [phone]  Name: MARSHA GUNDLACH MRN: [id number] Date of Birth: 07-02-1956

## 2020-04-26 ENCOUNTER — Ambulatory Visit (INDEPENDENT_AMBULATORY_CARE_PROVIDER_SITE_OTHER): Payer: Medicare Other | Admitting: Orthopedic Surgery

## 2020-04-26 DIAGNOSIS — S46811A Strain of other muscles, fascia and tendons at shoulder and upper arm level, right arm, initial encounter: Secondary | ICD-10-CM

## 2020-04-29 ENCOUNTER — Encounter: Payer: Self-pay | Admitting: Orthopedic Surgery

## 2020-04-29 NOTE — Progress Notes (Signed)
Office Visit Note   Patient: Jessica [name]           Date of Birth: 1955/08/08           MRN: [id number] Visit Date: 04/26/2020 Requested by: Susy Frizzle, MD [address] [place],  [place] [address] PCP: Susy Frizzle, MD  Subjective: Chief Complaint  Patient presents with  . Post-op Follow-up    HPI: Jessica [name] is a 64 y.o. female who presents to the office s/p right shoulder biceps tenodesis with rotator cuff repair.  She is about 4 and half months postop.  She was doing physical therapy up until the first week of October.  She has now transition to a home exercise program.  She notes that she can now wash her hair with her right arm which she was unable to do at her last visit.  She has equivalent active range of motion of the right shoulder compared with the contralateral side.  Passively she has 70 degrees external rotation, 105 degrees abduction, 160 degrees forward flexion.  She has excellent 5/5 strength of the infraspinatus and subscapularis with 5 -/5 strength of the supraspinatus.  Incisions have healed well on exam.  No crepitus is felt with passive range of motion of the right shoulder.  Patient is doing very well and is worked hard in therapy.  Plan for patient to follow-up as needed..                ROS: All systems reviewed are negative as they relate to the chief complaint within the history of present illness.  Patient denies fevers or chills.  Assessment & Plan: Visit Diagnoses: No diagnosis found.  Plan: See above.  Follow-Up Instructions: No follow-ups on file.   Orders:  No orders of the defined types were placed in this encounter.  No orders of the defined types were placed in this encounter.     Procedures: No procedures performed   Clinical Data: No additional findings.  Objective: Vital Signs: There were no vitals taken for this visit.  Physical Exam:  Constitutional: Patient appears well-developed HEENT:  Head:  Normocephalic Eyes:EOM are normal Neck: Normal range of motion Cardiovascular: Normal rate Pulmonary/chest: Effort normal Neurologic: Patient is alert Skin: Skin is warm Psychiatric: Patient has normal mood and affect  Ortho Exam: See above.  Specialty Comments:  No specialty comments available.  Imaging: No results found.   PMFS History: Patient Active Problem List   Diagnosis Date Noted  . Traumatic tear of supraspinatus tendon of right shoulder 11/18/2019  . Tear of right infraspinatus tendon 11/18/2019  . Right carpal tunnel syndrome 11/04/2019  . Acute pain of right shoulder 11/04/2019  . Hypertension 04/12/2019  . Glucose intolerance 04/12/2019  . Chronic venous insufficiency 04/06/2019  . Osteopenia 04/06/2019   Past Medical History:  Diagnosis Date  . Anemia    per patient, had it years ago before hysterectomy years ago  . Arthritis   . Asthma   . Bronchitis   . Carpal tunnel syndrome of right wrist   . Chronic venous insufficiency   . GERD (gastroesophageal reflux disease)   . History of hiatal hernia   . Hypertension   . Pneumonia   . PONV (postoperative nausea and vomiting)     No family history on file.  Past Surgical History:  Procedure Laterality Date  . ABDOMINAL HYSTERECTOMY    . CARPAL TUNNEL RELEASE Right 12/16/2019   Procedure: RIGHT CARPAL  TUNNEL RELEASE;  Surgeon: Meredith Pel, MD;  Location: [facility];  Service: Orthopedics;  Laterality: Right;  . COLONOSCOPY  06/05/2012   Procedure: COLONOSCOPY;  Surgeon: Rogene [facility], MD;  [facility];  Service: Endoscopy;  Laterality: N/A;  125-changed to 1225 Ann to notify pt  . NISSEN FUNDOPLICATION     x2  . SHOULDER ARTHROSCOPY WITH SUBACROMIAL DECOMPRESSION, ROTATOR CUFF REPAIR AND BICEP TENDON REPAIR Right 12/16/2019   Procedure: RIGHT SHOULDER ARTHROSCOPY, BICEPS TENODESIS, MINI OPEN ROTATOR CUFF TEAR REPAIR, DEBRIDEMENT;  Surgeon: Meredith Pel, MD;  Location: [facility];   Service: Orthopedics;  Laterality: Right;   Social History   Occupational History  . Not on file  Tobacco Use  . Smoking status: Never Smoker  . Smokeless tobacco: Never Used  Vaping Use  . Vaping Use: Never used  Substance and Sexual Activity  . Alcohol use: No  . Drug use: No  . Sexual activity: Not on file

## 2020-05-23 ENCOUNTER — Other Ambulatory Visit: Payer: Self-pay | Admitting: Family Medicine

## 2020-06-22 ENCOUNTER — Other Ambulatory Visit: Payer: Self-pay | Admitting: Family Medicine

## 2020-07-18 ENCOUNTER — Other Ambulatory Visit: Payer: Self-pay

## 2020-07-18 ENCOUNTER — Ambulatory Visit (INDEPENDENT_AMBULATORY_CARE_PROVIDER_SITE_OTHER): Payer: Medicare Other | Admitting: Nurse Practitioner

## 2020-07-18 DIAGNOSIS — J209 Acute bronchitis, unspecified: Secondary | ICD-10-CM | POA: Diagnosis not present

## 2020-07-18 DIAGNOSIS — J069 Acute upper respiratory infection, unspecified: Secondary | ICD-10-CM

## 2020-07-18 MED ORDER — GUAIFENESIN ER 600 MG PO TB12: 600.0000 mg | ORAL_TABLET | Freq: Two times a day (BID) | ORAL | 0 refills | Status: DC | PRN

## 2020-07-18 MED ORDER — PREDNISONE 20 MG PO TABS: 40.0000 mg | ORAL_TABLET | Freq: Every day | ORAL | 0 refills | Status: DC

## 2020-07-18 MED ORDER — ALBUTEROL SULFATE HFA 108 (90 BASE) MCG/ACT IN AERS: 2.0000 | INHALATION_SPRAY | RESPIRATORY_TRACT | 0 refills | Status: DC | PRN

## 2020-07-18 NOTE — Progress Notes (Signed)
Subjective:    Patient ID: Jessica [name], female    DOB: 04-18-1956, 65 y.o.   MRN: [id number]  HPI: Jessica [name] is a 65 y.o. female presenting Via parking lot visit due to COVID-19 pandemic for upper respiratory infection.  Chief Complaint  Patient presents with  . URI   UPPER RESPIRATORY TRACT INFECTION Has albuterol inhaler from when she had bronchitis in the past, is expired and requesting new one.  Does have a smoking history.  Is requesting COVID testing today. Onset: 5 days ago Worst symptom: congestion, wheezing, body aches Fever: TMAX at home 99.1, + body aches and hot/cold chills Cough: yes; productive with clear sputum Shortness of breath: no Wheezing: yes Chest pain: no Chest tightness: yes Chest congestion: yes Nasal congestion: yes Runny nose: yes Post nasal drip: yes Sneezing: yes Sore throat: yes Swollen glands: no Sinus pressure: yes Headache: yes Face pain: no Toothache: no Ear pain: no  Ear pressure: yes; bilateral  Eyes red/itching:no Eye drainage/crusting: no  Nausea: no Vomiting: no Diarrhea: no Change in appetite: no Loss of taste/smell: no Rash: no Fatigue: yes Sick contacts: no Strep contacts: no  Context: stable Recurrent sinusitis: no Treatments attempted: Dayquil, Nyquil, naproxen, flonase Relief with OTC medications: yes  Allergies  Allergen Reactions  . Codeine Other (See Comments)    Severe headaches   . Tetanus Antitoxin Swelling and Other (See Comments)    Fever   . Tetanus Toxoids Swelling and Other (See Comments)    fever  . Penicillins Rash    Outpatient Encounter Medications as of 07/18/2020  Medication Sig  . guaiFENesin (MUCINEX) 600 MG 12 hr tablet Take 1 tablet (600 mg total) by mouth 2 (two) times daily as needed for cough or to loosen phlegm.  . predniSONE (DELTASONE) 20 MG tablet Take 2 tablets (40 mg total) by mouth daily with breakfast.  . albuterol (VENTOLIN HFA) 108 (90 Base) MCG/ACT inhaler  Inhale 2 puffs into the lungs every 4 (four) hours as needed for wheezing or shortness of breath.  Marland Kitchen aspirin EC 81 MG tablet Take 1 tablet (81 mg total) by mouth daily.  . Calcium-Phosphorus-Vitamin D (CITRACAL +D3 PO) Take 1 tablet by mouth daily with supper.  . cetirizine (ZYRTEC) 10 MG tablet Take 10 mg by mouth daily as needed (allergies.).   Marland Kitchen Coenzyme Q10-Vitamin E (QUNOL ULTRA COQ10) 100-150 MG-UNIT CAPS Take 1 capsule by mouth daily with supper.  . esomeprazole (NEXIUM) 20 MG capsule Take 20 mg by mouth daily with supper.  . fluticasone (FLONASE) 50 MCG/ACT nasal spray Place 2 sprays into both nostrils daily. (Patient taking differently: Place 2 sprays into both nostrils daily as needed for allergies. )  . furosemide (LASIX) 40 MG tablet Take 1 tablet (40 mg total) by mouth daily. (Patient taking differently: Take 40 mg by mouth daily as needed (fluid retention/swelling.). )  . methocarbamol (ROBAXIN) 500 MG tablet Take 1 tablet (500 mg total) by mouth every 8 (eight) hours as needed.  . naproxen sodium (ANAPROX) 220 MG tablet Take 220-440 mg by mouth 3 (three) times daily as needed (pain. (Max 3 tablets/24 hrs.)).   Marland Kitchen oxyCODONE-acetaminophen (PERCOCET) 5-325 MG tablet Take 1 tablet by mouth every 6 (six) hours as needed for severe pain.  . ramipril (ALTACE) 5 MG capsule Take 1 capsule by mouth once daily  . sodium chloride (MURO 128) 5 % ophthalmic ointment Place 1 drop into the left eye at bedtime as needed (irritated/dry eyes).   . [  DISCONTINUED] albuterol (PROVENTIL HFA;VENTOLIN HFA) 108 (90 Base) MCG/ACT inhaler Inhale 2 puffs into the lungs every 4 (four) hours as needed for wheezing or shortness of breath.   No facility-administered encounter medications on file as of 07/18/2020.    Patient Active Problem List   Diagnosis Date Noted  . Upper respiratory tract infection 07/18/2020  . Traumatic tear of supraspinatus tendon of right shoulder 11/18/2019  . Tear of right infraspinatus  tendon 11/18/2019  . Right carpal tunnel syndrome 11/04/2019  . Acute pain of right shoulder 11/04/2019  . Hypertension 04/12/2019  . Glucose intolerance 04/12/2019  . Chronic venous insufficiency 04/06/2019  . Osteopenia 04/06/2019    Past Medical History:  Diagnosis Date  . Anemia    per patient, had it years ago before hysterectomy years ago  . Arthritis   . Asthma   . Bronchitis   . Carpal tunnel syndrome of right wrist   . Chronic venous insufficiency   . GERD (gastroesophageal reflux disease)   . History of hiatal hernia   . Hypertension   . Pneumonia   . PONV (postoperative nausea and vomiting)     Relevant past medical, surgical, family and social history reviewed and updated as indicated. Interim medical history since our last visit reviewed.  Review of Systems  Constitutional: Positive for chills, fatigue and fever. Negative for activity change and appetite change.  HENT: Positive for congestion, ear pain, postnasal drip, rhinorrhea, sinus pressure, sneezing and sore throat. Negative for ear discharge, mouth sores, nosebleeds, sinus pain and trouble swallowing.   Eyes: Negative.  Negative for pain, discharge, redness and itching.  Respiratory: Positive for cough, chest tightness and wheezing. Negative for shortness of breath.   Cardiovascular: Negative.  Negative for chest pain.  Gastrointestinal: Negative.  Negative for diarrhea, nausea, rectal pain and vomiting.  Musculoskeletal: Positive for myalgias. Negative for arthralgias.  Skin: Negative.  Negative for rash.  Neurological: Positive for headaches. Negative for dizziness and weakness.  Hematological: Negative.  Negative for adenopathy.  Psychiatric/Behavioral: Negative.     Per HPI unless specifically indicated above     Objective:    There were no vitals taken for this visit.  Wt Readings from Last 3 Encounters:  02/07/20 (!) 288 lb (130.6 kg)  12/16/19 288 lb (130.6 kg)  12/09/19 288 lb 9.3 oz  (130.9 kg)    Physical Exam Nursing note reviewed.  Constitutional:      General: She is not in acute distress.    Appearance: Normal appearance. She is not toxic-appearing.  HENT:     Head: Normocephalic and atraumatic.     Right Ear: External ear normal.     Left Ear: External ear normal.     Nose: Congestion present. No rhinorrhea.     Mouth/Throat:     Mouth: Mucous membranes are moist.     Pharynx: Oropharynx is clear.  Eyes:     General: No scleral icterus.    Extraocular Movements: Extraocular movements intact.  Cardiovascular:     Rate and Rhythm: Normal rate.     Heart sounds: Normal heart sounds.  Pulmonary:     Effort: Pulmonary effort is normal. No respiratory distress.     Breath sounds: Wheezing present. No rhonchi or rales.  Abdominal:     General: Abdomen is flat. Bowel sounds are normal. There is no distension.  Skin:    General: Skin is warm.     Capillary Refill: Capillary refill takes less than 2 seconds.  Coloration: Skin is not jaundiced or pale.  Neurological:     Mental Status: She is alert and oriented to person, place, and time.  Psychiatric:        Mood and Affect: Mood normal.        Behavior: Behavior normal.        Thought Content: Thought content normal.        Judgment: Judgment normal.     Results for orders placed or performed during the hospital encounter of 12/14/19  SARS CORONAVIRUS 2 (TAT 6-24 HRS) Nasopharyngeal Nasopharyngeal Swab   Specimen: Nasopharyngeal Swab  Result Value Ref Range   SARS Coronavirus 2 NEGATIVE NEGATIVE      Assessment & Plan:   Problem List Items Addressed This Visit      Respiratory   Upper respiratory tract infection - Primary    Acute, ongoing x 5 days.  Given wheezing, will send refill of albuterol inhaler and start prednisone 40 mg x 5 days for wheezing.  May need full pulmonary work up with PFTs if symptoms do not improve.  Also consider chest x-ray if symptoms do not improve. COVID swab  obtained. At this point, other symptoms likely viral and reassured patient that symptoms and exam findings are most consistent with a viral upper respiratory infection and explained lack of efficacy of antibiotics against viruses.  Discussed expected course and features suggestive of secondary bacterial infection.  Continue supportive care. Increase fluid intake with water or electrolyte solution like pedialyte. Encouraged acetaminophen as needed for fever/pain. Encouraged salt water gargling, chloraseptic spray and throat lozenges. Encouraged OTC guaifenesin. Encouraged saline sinus flushes and/or neti with humidified air.        Relevant Medications   guaiFENesin (MUCINEX) 600 MG 12 hr tablet   predniSONE (DELTASONE) 20 MG tablet   albuterol (VENTOLIN HFA) 108 (90 Base) MCG/ACT inhaler   Other Relevant Orders   SARS-COV-2 RNA,(COVID-19) QUAL NAAT    Other Visit Diagnoses    Acute bronchitis, unspecified organism       Relevant Medications   guaiFENesin (MUCINEX) 600 MG 12 hr tablet   predniSONE (DELTASONE) 20 MG tablet   albuterol (VENTOLIN HFA) 108 (90 Base) MCG/ACT inhaler       Follow up plan: Return if symptoms worsen or fail to improve.

## 2020-07-18 NOTE — Assessment & Plan Note (Signed)
Acute, ongoing x 5 days.  Given wheezing, will send refill of albuterol inhaler and start prednisone 40 mg x 5 days for wheezing.  May need full pulmonary work up with PFTs if symptoms do not improve.  Also consider chest x-ray if symptoms do not improve. COVID swab obtained. At this point, other symptoms likely viral and reassured patient that symptoms and exam findings are most consistent with a viral upper respiratory infection and explained lack of efficacy of antibiotics against viruses.  Discussed expected course and features suggestive of secondary bacterial infection.  Continue supportive care. Increase fluid intake with water or electrolyte solution like pedialyte. Encouraged acetaminophen as needed for fever/pain. Encouraged salt water gargling, chloraseptic spray and throat lozenges. Encouraged OTC guaifenesin. Encouraged saline sinus flushes and/or neti with humidified air.

## 2020-07-18 NOTE — Patient Instructions (Signed)

## 2020-07-20 LAB — SARS-COV-2 RNA,(COVID-19) QUALITATIVE NAAT: SARS CoV2 RNA: NOT DETECTED

## 2020-07-25 ENCOUNTER — Telehealth: Payer: Self-pay

## 2020-07-25 ENCOUNTER — Other Ambulatory Visit: Payer: Self-pay | Admitting: Family Medicine

## 2020-07-25 ENCOUNTER — Telehealth (INDEPENDENT_AMBULATORY_CARE_PROVIDER_SITE_OTHER): Payer: Medicare Other | Admitting: Family Medicine

## 2020-07-25 ENCOUNTER — Other Ambulatory Visit: Payer: Self-pay

## 2020-07-25 DIAGNOSIS — J019 Acute sinusitis, unspecified: Secondary | ICD-10-CM

## 2020-07-25 DIAGNOSIS — B9689 Other specified bacterial agents as the cause of diseases classified elsewhere: Secondary | ICD-10-CM | POA: Diagnosis not present

## 2020-07-25 MED ORDER — CEFDINIR 300 MG PO CAPS: 300.0000 mg | ORAL_CAPSULE | Freq: Two times a day (BID) | ORAL | 0 refills | Status: DC

## 2020-07-25 NOTE — Progress Notes (Signed)
Subjective:    Patient ID: Jessica [name], female    DOB: [birth year], 65 y.o.   MRN: [id number]  HPI  Patient is being seen today as a telephone visit.  She consents to be seen via telephone.  She is currently at home.  I am currently my office.  Phone call began around 2:00.  Phone call concluded at 210.  Symptoms began approximately 2 weeks ago.  She was seen 1 week ago by my partner who diagnosed her with a viral upper respiratory infection and recommended Mucinex.  Her COVID test at that time was negative on January 4.  She reports pain and pressure around her eyes and in her cheekbones.  She reports severe head congestion and rhinorrhea.  She reports postnasal drip causing occasional cough but she denies any chest pain or shortness of breath or dyspnea on exertion.  She denies any high fever.  She denies any myalgias or body aches or nausea or vomiting. Past Medical History:  Diagnosis Date  . Anemia    per patient, had it years ago before hysterectomy years ago  . Arthritis   . Asthma   . Bronchitis   . Carpal tunnel syndrome of right wrist   . Chronic venous insufficiency   . GERD (gastroesophageal reflux disease)   . History of hiatal hernia   . Hypertension   . Pneumonia   . PONV (postoperative nausea and vomiting)    Past Surgical History:  Procedure Laterality Date  . ABDOMINAL HYSTERECTOMY    . CARPAL TUNNEL RELEASE Right 12/16/2019   Procedure: RIGHT CARPAL TUNNEL RELEASE;  Surgeon: Meredith Pel, MD;  Location: [place];  Service: Orthopedics;  Laterality: Right;  . COLONOSCOPY  06/05/2012   Procedure: COLONOSCOPY;  Surgeon: Rogene Houston, MD;  [facility];  Service: Endoscopy;  Laterality: N/A;  125-changed to 1225 Ann to notify pt  . NISSEN FUNDOPLICATION     x2  . SHOULDER ARTHROSCOPY WITH SUBACROMIAL DECOMPRESSION, ROTATOR CUFF REPAIR AND BICEP TENDON REPAIR Right 12/16/2019   Procedure: RIGHT SHOULDER ARTHROSCOPY, BICEPS TENODESIS, MINI OPEN ROTATOR  CUFF TEAR REPAIR, DEBRIDEMENT;  Surgeon: Meredith Pel, MD;  Location: [place];  Service: Orthopedics;  Laterality: Right;   Current Outpatient Medications on File Prior to Visit  Medication Sig Dispense Refill  . albuterol (VENTOLIN HFA) 108 (90 Base) MCG/ACT inhaler Inhale 2 puffs into the lungs every 4 (four) hours as needed for wheezing or shortness of breath. 1 each 0  . aspirin EC 81 MG tablet Take 1 tablet (81 mg total) by mouth daily. 14 tablet 0  . Calcium-Phosphorus-Vitamin D (CITRACAL +D3 PO) Take 1 tablet by mouth daily with supper.    . cetirizine (ZYRTEC) 10 MG tablet Take 10 mg by mouth daily as needed (allergies.).     Marland Kitchen Coenzyme Q10-Vitamin E (QUNOL ULTRA COQ10) 100-150 MG-UNIT CAPS Take 1 capsule by mouth daily with supper.    . esomeprazole (NEXIUM) 20 MG capsule Take 20 mg by mouth daily with supper.    . fluticasone (FLONASE) 50 MCG/ACT nasal spray Place 2 sprays into both nostrils daily. (Patient taking differently: Place 2 sprays into both nostrils daily as needed for allergies. ) 16 g 2  . furosemide (LASIX) 40 MG tablet Take 1 tablet (40 mg total) by mouth daily. (Patient taking differently: Take 40 mg by mouth daily as needed (fluid retention/swelling.). ) 90 tablet 1  . guaiFENesin (MUCINEX) 600 MG 12 hr tablet Take 1 tablet (  600 mg total) by mouth 2 (two) times daily as needed for cough or to loosen phlegm. 30 tablet 0  . methocarbamol (ROBAXIN) 500 MG tablet Take 1 tablet (500 mg total) by mouth every 8 (eight) hours as needed. 30 tablet 0  . naproxen sodium (ANAPROX) 220 MG tablet Take 220-440 mg by mouth 3 (three) times daily as needed (pain. (Max 3 tablets/24 hrs.)).     Marland Kitchen oxyCODONE-acetaminophen (PERCOCET) 5-325 MG tablet Take 1 tablet by mouth every 6 (six) hours as needed for severe pain. 30 tablet 0  . predniSONE (DELTASONE) 20 MG tablet Take 2 tablets (40 mg total) by mouth daily with breakfast. 10 tablet 0  . ramipril (ALTACE) 5 MG capsule Take 1 capsule by  mouth once daily 90 capsule 0  . sodium chloride (MURO 128) 5 % ophthalmic ointment Place 1 drop into the left eye at bedtime as needed (irritated/dry eyes).      No current facility-administered medications on file prior to visit.   Allergies  Allergen Reactions  . Codeine Other (See Comments)    Severe headaches   . Tetanus Antitoxin Swelling and Other (See Comments)    Fever   . Tetanus Toxoids Swelling and Other (See Comments)    fever  . Penicillins Rash   Social History   Socioeconomic History  . Marital status: Married    Spouse name: Not on file  . Number of children: Not on file  . Years of education: Not on file  . Highest education level: Not on file  Occupational History  . Not on file  Tobacco Use  . Smoking status: Never Smoker  . Smokeless tobacco: Never Used  Vaping Use  . Vaping Use: Never used  Substance and Sexual Activity  . Alcohol use: No  . Drug use: No  . Sexual activity: Not on file  Other Topics Concern  . Not on file  Social History Narrative  . Not on file   Social Determinants of Health   Financial Resource Strain: Not on file  Food Insecurity: Not on file  Transportation Needs: Not on file  Physical Activity: Not on file  Stress: Not on file  Social Connections: Not on file  Intimate Partner Violence: Not on file     Review of Systems  All other systems reviewed and are negative.      Objective:   Physical Exam        Assessment & Plan:  Acute bacterial rhinosinusitis  I agree with the initial assessment that the patient likely had a viral upper respiratory infection but I believe she has developed a secondary sinus infection.  COVID test was negative.  Therefore I will treat her for sinusitis given the head congestion and pain that she is experiencing.  Begin Omnicef 300 mg p.o. twice daily for 10 days.  Recheck immediately if symptoms worsen or change.

## 2020-07-25 NOTE — Telephone Encounter (Signed)
Patient called for appointment, scheduled Jessica [name] with a virtual visit

## 2020-10-02 ENCOUNTER — Other Ambulatory Visit: Payer: Self-pay | Admitting: Family Medicine

## 2020-10-07 ENCOUNTER — Other Ambulatory Visit: Payer: Self-pay | Admitting: Family Medicine

## 2020-10-09 ENCOUNTER — Telehealth: Payer: Self-pay | Admitting: Family Medicine

## 2020-10-12 ENCOUNTER — Ambulatory Visit (INDEPENDENT_AMBULATORY_CARE_PROVIDER_SITE_OTHER): Payer: Medicare Other | Admitting: Family Medicine

## 2020-10-12 ENCOUNTER — Encounter: Payer: Self-pay | Admitting: Family Medicine

## 2020-10-12 ENCOUNTER — Other Ambulatory Visit: Payer: Self-pay

## 2020-10-12 VITALS — BP 146/82 | HR 98 | Temp 98.1°F | Resp 16 | Ht 64.0 in | Wt 295.0 lb

## 2020-10-12 DIAGNOSIS — M5432 Sciatica, left side: Secondary | ICD-10-CM | POA: Diagnosis not present

## 2020-10-12 DIAGNOSIS — M5431 Sciatica, right side: Secondary | ICD-10-CM | POA: Diagnosis not present

## 2020-10-12 DIAGNOSIS — R5382 Chronic fatigue, unspecified: Secondary | ICD-10-CM

## 2020-10-12 LAB — CBC WITH DIFFERENTIAL/PLATELET
Absolute Monocytes: 701 cells/uL (ref 200–950)
Basophils Absolute: 51 cells/uL (ref 0–200)
Basophils Relative: 0.7 %
Eosinophils Absolute: 197 cells/uL (ref 15–500)
Eosinophils Relative: 2.7 %
HCT: 41.1 % (ref 35.0–45.0)
Hemoglobin: 13.1 g/dL (ref 11.7–15.5)
Lymphs Abs: 2884 cells/uL (ref 850–3900)
MCH: 26.8 pg — ABNORMAL LOW (ref 27.0–33.0)
MCHC: 31.9 g/dL — ABNORMAL LOW (ref 32.0–36.0)
MCV: 84.2 fL (ref 80.0–100.0)
MPV: 11.4 fL (ref 7.5–12.5)
Monocytes Relative: 9.6 %
Neutro Abs: 3468 cells/uL (ref 1500–7800)
Neutrophils Relative %: 47.5 %
Platelets: 202 10*3/uL (ref 140–400)
RBC: 4.88 10*6/uL (ref 3.80–5.10)
RDW: 14 % (ref 11.0–15.0)
Total Lymphocyte: 39.5 %
WBC: 7.3 10*3/uL (ref 3.8–10.8)

## 2020-10-12 LAB — COMPLETE METABOLIC PANEL WITH GFR
AG Ratio: 1.5 (calc) (ref 1.0–2.5)
ALT: 15 U/L (ref 6–29)
AST: 17 U/L (ref 10–35)
Albumin: 4.1 g/dL (ref 3.6–5.1)
Alkaline phosphatase (APISO): 93 U/L (ref 37–153)
BUN: 16 mg/dL (ref 7–25)
CO2: 29 mmol/L (ref 20–32)
Calcium: 9.3 mg/dL (ref 8.6–10.4)
Chloride: 103 mmol/L (ref 98–110)
Creat: 0.87 mg/dL (ref 0.50–0.99)
GFR, Est African American: 80 mL/min/{1.73_m2} (ref >=60)
GFR, Est Non African American: 69 mL/min/{1.73_m2} (ref >=60)
Globulin: 2.8 g/dL (calc) (ref 1.9–3.7)
Glucose, Bld: 100 mg/dL — ABNORMAL HIGH (ref 65–99)
Potassium: 4.6 mmol/L (ref 3.5–5.3)
Sodium: 141 mmol/L (ref 135–146)
Total Bilirubin: 0.3 mg/dL (ref 0.2–1.2)
Total Protein: 6.9 g/dL (ref 6.1–8.1)

## 2020-10-12 LAB — TSH: TSH: 4.23 mIU/L (ref 0.40–4.50)

## 2020-10-12 MED ORDER — HYDROCODONE-ACETAMINOPHEN 5-325 MG PO TABS: 1.0000 | ORAL_TABLET | Freq: Four times a day (QID) | ORAL | 0 refills | Status: DC | PRN

## 2020-10-12 MED ORDER — PREDNISONE 20 MG PO TABS
ORAL_TABLET | ORAL | 0 refills | Status: DC
Start: 2020-10-12 — End: 2021-07-20

## 2020-10-12 NOTE — Progress Notes (Signed)
Subjective:    Patient ID: Jessica [name], female    DOB: 1955/10/24, 65 y.o.   MRN: [id number]  HPI  Patient fell in March 2019 and injured her lower back and her shoulder.  Since that time, she has had episodes of pain radiating down both legs.  However over the last 3 to 4 weeks has become constant.  She reports pain radiating into her posterior left gluteal area down her left leg all the way into her left foot.  The pain is a burning stinging pins-and-needles sensation consistent with sciatica.  She has severe pain trying to go up steps.  She also has occasional pains radiating down her right leg though nowhere near as severe as the left leg.  She denies any bowel or bladder incontinence.  She denies any saddle anesthesias.  She does report severe fatigue.  She states that she has a history of obstructive sleep apnea and is not wearing her machine at night because she cannot tolerate it.  This was diagnosed many years ago.  She states that she has no energy and hypersomnolence.  She would like lab work to check her thyroid. Past Medical History:  Diagnosis Date  . Anemia    per patient, had it years ago before hysterectomy years ago  . Arthritis   . Asthma   . Bronchitis   . Carpal tunnel syndrome of right wrist   . Chronic venous insufficiency   . GERD (gastroesophageal reflux disease)   . History of hiatal hernia   . Hypertension   . Pneumonia   . PONV (postoperative nausea and vomiting)    Past Surgical History:  Procedure Laterality Date  . ABDOMINAL HYSTERECTOMY    . CARPAL TUNNEL RELEASE Right 12/16/2019   Procedure: RIGHT CARPAL TUNNEL RELEASE;  Surgeon: Meredith Pel, MD;  Location: [place];  Service: Orthopedics;  Laterality: Right;  . COLONOSCOPY  06/05/2012   Procedure: COLONOSCOPY;  Surgeon: Rogene Houston, MD;  [facility];  Service: Endoscopy;  Laterality: N/A;  125-changed to 1225 Ann to notify pt  . NISSEN FUNDOPLICATION     x2  . SHOULDER  ARTHROSCOPY WITH SUBACROMIAL DECOMPRESSION, ROTATOR CUFF REPAIR AND BICEP TENDON REPAIR Right 12/16/2019   Procedure: RIGHT SHOULDER ARTHROSCOPY, BICEPS TENODESIS, MINI OPEN ROTATOR CUFF TEAR REPAIR, DEBRIDEMENT;  Surgeon: Meredith Pel, MD;  Location: [facility];  Service: Orthopedics;  Laterality: Right;   Current Outpatient Medications on File Prior to Visit  Medication Sig Dispense Refill  . albuterol (VENTOLIN HFA) 108 (90 Base) MCG/ACT inhaler Inhale 2 puffs into the lungs every 4 (four) hours as needed for wheezing or shortness of breath. 1 each 0  . aspirin EC 81 MG tablet Take 1 tablet (81 mg total) by mouth daily. 14 tablet 0  . Calcium-Phosphorus-Vitamin D (CITRACAL +D3 PO) Take 1 tablet by mouth daily with supper.    . cefdinir (OMNICEF) 300 MG capsule Take 1 capsule (300 mg total) by mouth 2 (two) times daily. 20 capsule 0  . cetirizine (ZYRTEC) 10 MG tablet Take 10 mg by mouth daily as needed (allergies.).     Marland Kitchen Coenzyme Q10-Vitamin E (QUNOL ULTRA COQ10) 100-150 MG-UNIT CAPS Take 1 capsule by mouth daily with supper.    . esomeprazole (NEXIUM) 20 MG capsule Take 20 mg by mouth daily with supper.    . fluticasone (FLONASE) 50 MCG/ACT nasal spray Place 2 sprays into both nostrils daily. (Patient taking differently: Place 2 sprays into both nostrils  daily as needed for allergies. ) 16 g 2  . furosemide (LASIX) 40 MG tablet Take 1 tablet by mouth once daily 90 tablet 0  . guaiFENesin (MUCINEX) 600 MG 12 hr tablet Take 1 tablet (600 mg total) by mouth 2 (two) times daily as needed for cough or to loosen phlegm. 30 tablet 0  . methocarbamol (ROBAXIN) 500 MG tablet Take 1 tablet (500 mg total) by mouth every 8 (eight) hours as needed. 30 tablet 0  . naproxen sodium (ANAPROX) 220 MG tablet Take 220-440 mg by mouth 3 (three) times daily as needed (pain. (Max 3 tablets/24 hrs.)).     Marland Kitchen oxyCODONE-acetaminophen (PERCOCET) 5-325 MG tablet Take 1 tablet by mouth every 6 (six) hours as needed for  severe pain. 30 tablet 0  . predniSONE (DELTASONE) 20 MG tablet Take 2 tablets (40 mg total) by mouth daily with breakfast. 10 tablet 0  . ramipril (ALTACE) 5 MG capsule Take 1 capsule by mouth once daily 90 capsule 0  . sodium chloride (MURO 128) 5 % ophthalmic ointment Place 1 drop into the left eye at bedtime as needed (irritated/dry eyes).      No current facility-administered medications on file prior to visit.   Allergies  Allergen Reactions  . Codeine Other (See Comments)    Severe headaches   . Tetanus Antitoxin Swelling and Other (See Comments)    Fever   . Tetanus Toxoids Swelling and Other (See Comments)    fever  . Penicillins Rash   Social History   Socioeconomic History  . Marital status: Married    Spouse name: Not on file  . Number of children: Not on file  . Years of education: Not on file  . Highest education level: Not on file  Occupational History  . Not on file  Tobacco Use  . Smoking status: Never Smoker  . Smokeless tobacco: Never Used  Vaping Use  . Vaping Use: Never used  Substance and Sexual Activity  . Alcohol use: No  . Drug use: No  . Sexual activity: Not on file  Other Topics Concern  . Not on file  Social History Narrative  . Not on file   Social Determinants of Health   Financial Resource Strain: Not on file  Food Insecurity: Not on file  Transportation Needs: Not on file  Physical Activity: Not on file  Stress: Not on file  Social Connections: Not on file  Intimate Partner Violence: Not on file      Review of Systems  All other systems reviewed and are negative.      Objective:   Physical Exam Neck:     Vascular: No JVD.  Cardiovascular:     Rate and Rhythm: Normal rate and regular rhythm.     Heart sounds: Normal heart sounds. No murmur heard.   Pulmonary:     Effort: Pulmonary effort is normal. No respiratory distress.     Breath sounds: Normal breath sounds. No stridor. No wheezing or rales.  Musculoskeletal:      Lumbar back: Tenderness present. No bony tenderness. Decreased range of motion.       Legs:  Skin:        Chronic venous stasis changes in both legs.        Assessment & Plan:   Chronic fatigue - Plan: CBC with Differential/Platelet, COMPLETE METABOLIC PANEL WITH GFR, TSH  Bilateral sciatica  Patient symptoms sound like sciatica likely due to lumbar degenerative disc disease.  Begin prednisone  taper pack and Norco 5/325 1 p.o. every 6 hours as needed pain.  Reassess in 2 to 3 weeks.  If no better, I would proceed with an MRI of the lumbar spine to evaluate for nerve compression that may benefit from cortisone injections in the back.  Regarding her fatigue I will check a CBC, CMP, TSH.  If this is normal, I would recommend a repeat sleep study

## 2020-10-17 IMAGING — MR MR SHOULDER*R* W/O CM
5 series · 40 of 40 positions shown · non-contrast
Comparison: Radiographs dated 10/01/2019

CLINICAL DATA: Right shoulder pain since a fall in September 2019.

EXAM:
MRI OF THE RIGHT SHOULDER WITHOUT CONTRAST
TECHNIQUE: Multiplanar, multisequence MR imaging of the shoulder was performed.
No intravenous contrast was administered.

[Series 5: PD fat-sat · axial · right · 4.0mm · 0.47mm/px · z∈[-18,+73]mm · 6 of 23 slices shown (1 of 2)]
[im 1/23]
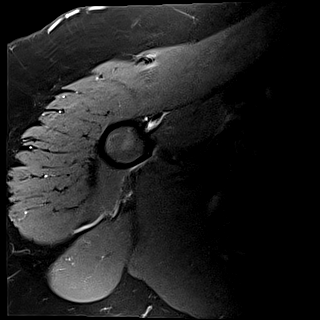
[im 5/23]
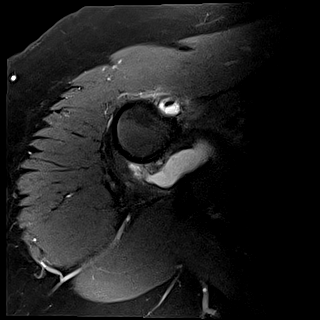
[im 9/23]
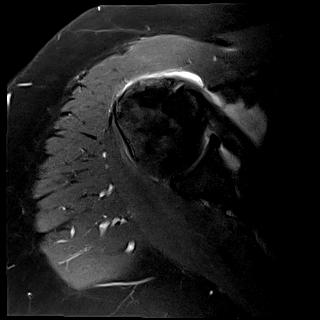
[im 14/23]
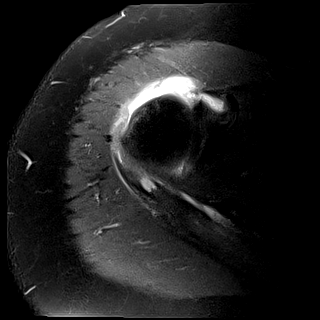
[im 18/23]
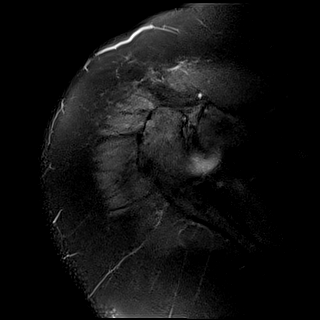
[im 23/23]
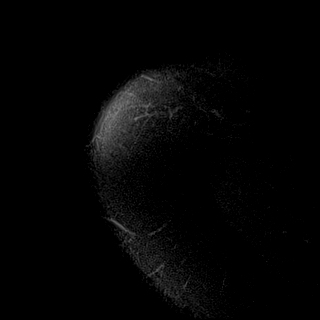

[Series 6: T2 fat-sat · oblique · right · 4.0mm · 0.47mm/px · 7 of 21 slices shown (1 of 2)]
[im 1/21]
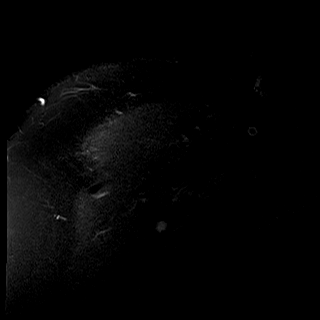
[im 4/21]
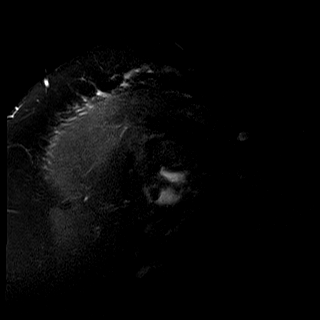
[im 7/21]
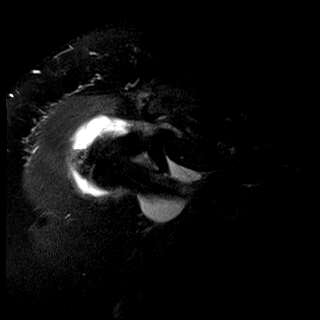
[im 11/21]
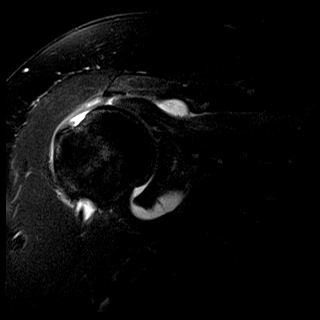
[im 14/21]
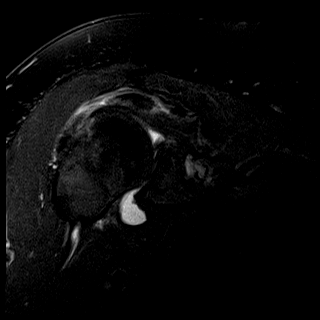
[im 17/21]
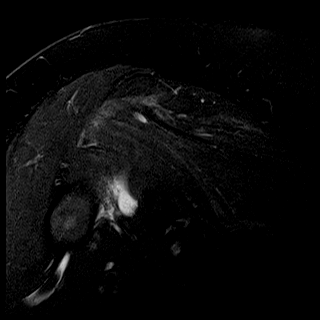
[im 21/21]
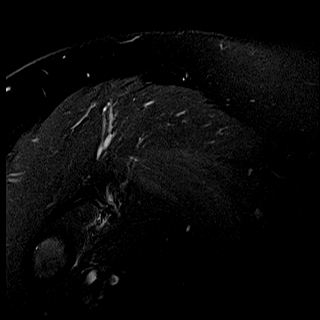

[Series 7: PD fat-sat · oblique · right · 4.0mm · 0.47mm/px · 7 of 21 slices shown (2 of 2)]
[im 1/21]
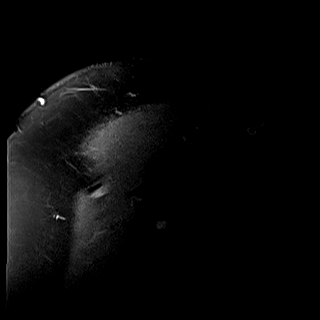
[im 4/21]
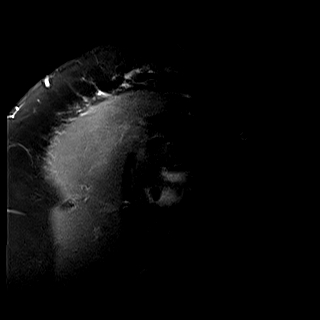
[im 7/21]
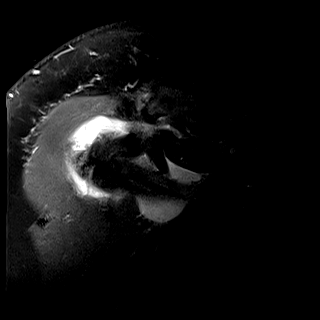
[im 11/21]
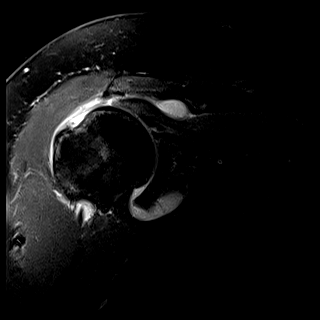
[im 14/21]
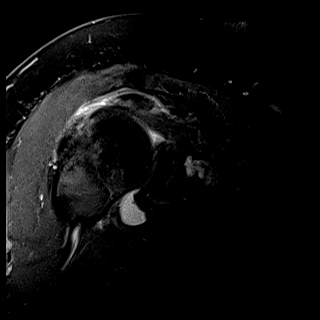
[im 17/21]
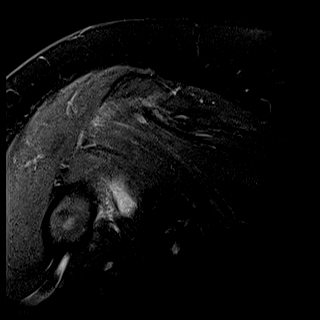
[im 21/21]
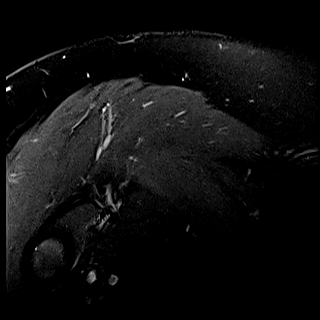

[Series 9: T1 · sagittal · right · 3.0mm · 0.59mm/px · 10 of 30 slices shown]
[im 1/30]
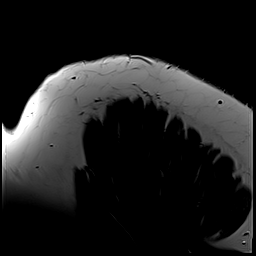
[im 4/30]
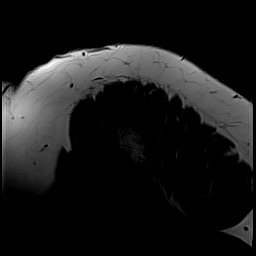
[im 7/30]
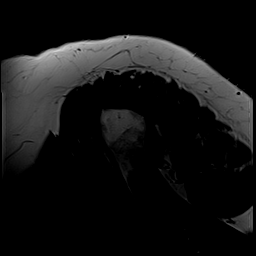
[im 10/30]
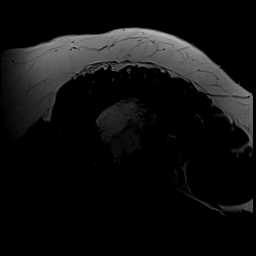
[im 13/30]
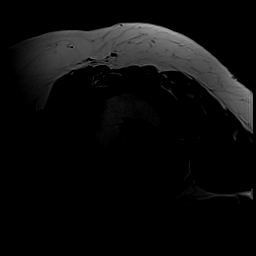
[im 17/30]
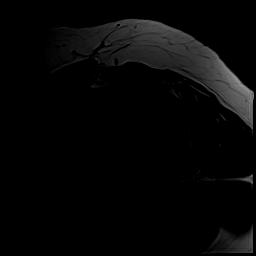
[im 20/30]
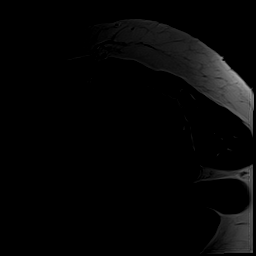
[im 23/30]
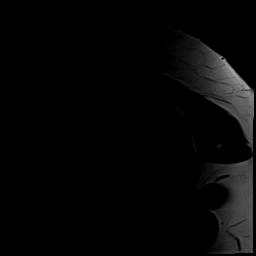
[im 26/30]
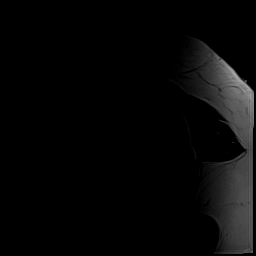
[im 30/30]
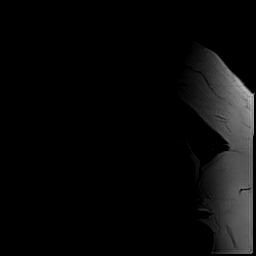

[Series 10: T2 fat-sat · sagittal · right · 3.0mm · 0.59mm/px · 10 of 30 slices shown (2 of 2)]
[im 1/30]
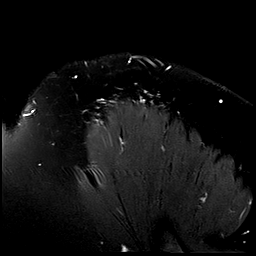
[im 4/30]
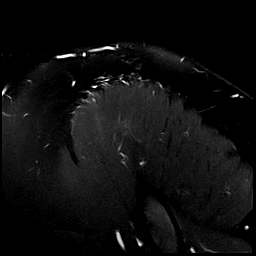
[im 7/30]
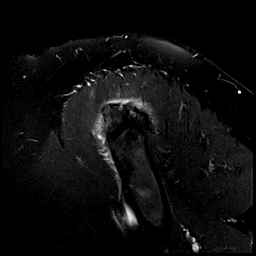
[im 10/30]
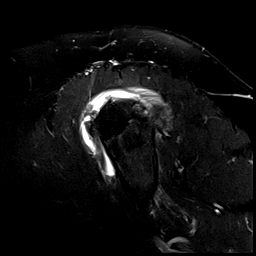
[im 13/30]
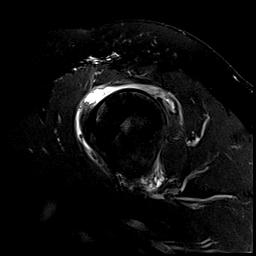
[im 17/30]
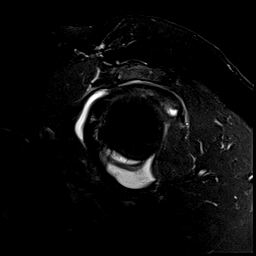
[im 20/30]
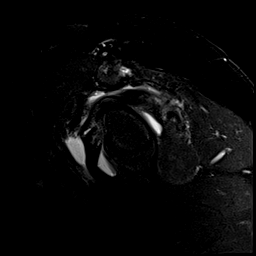
[im 23/30]
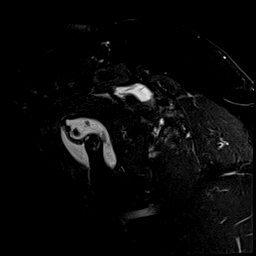
[im 26/30]
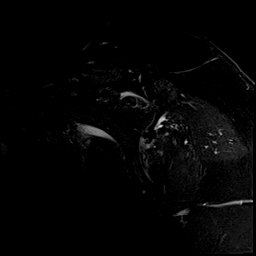
[im 30/30]
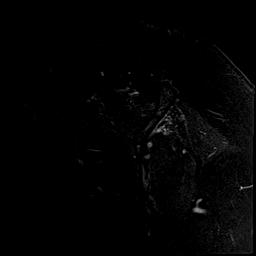

[40 of 40 positions shown; findings below may reference images not displayed]

FINDINGS: Rotator cuff: There is a 3 x 4 cm full-thickness retracted tear
involving the supraspinatus and infraspinatus tendons. The tear
extends into the musculotendinous junction of the infraspinatus. The
subscapularis and teres minor tendons are intact.

Muscles: There is moderate atrophy of the infraspinatus muscle and
slight atrophy of supraspinatus muscle.

Biceps long head:  Properly located and intact.

Acromioclavicular Joint: Minimal degenerative changes of the AC
joint. Type 2 acromion.

Glenohumeral Joint: Moderate glenohumeral joint effusion. Loose body
in the subcoracoid recess of the joint. No discrete chondral defect.

Labrum:  Intact.

Bones:  No marrow abnormality, fracture or dislocation.

Other: None
IMPRESSION: 1. Large full-thickness retracted tear of the supraspinatus and
infraspinatus tendons.
2. Moderate glenohumeral joint effusion with loose body in the
subcoracoid recess of the joint.

## 2020-11-04 ENCOUNTER — Ambulatory Visit
Admission: RE | Admit: 2020-11-04 | Discharge: 2020-11-04 | Disposition: A | Discharge status: Home / Self Care | Payer: Medicare Other | Source: Ambulatory Visit | Attending: Family Medicine | Admitting: Family Medicine

## 2020-11-04 DIAGNOSIS — M5432 Sciatica, left side: Secondary | ICD-10-CM

## 2020-11-04 DIAGNOSIS — M5431 Sciatica, right side: Secondary | ICD-10-CM

## 2020-11-09 ENCOUNTER — Other Ambulatory Visit: Payer: Self-pay

## 2020-11-09 ENCOUNTER — Encounter: Payer: Self-pay | Admitting: Family Medicine

## 2020-11-09 ENCOUNTER — Ambulatory Visit (INDEPENDENT_AMBULATORY_CARE_PROVIDER_SITE_OTHER): Payer: Medicare Other | Admitting: Family Medicine

## 2020-11-09 VITALS — BP 142/76 | HR 90 | Temp 97.2°F | Ht 64.0 in | Wt 293.4 lb

## 2020-11-09 DIAGNOSIS — M5432 Sciatica, left side: Secondary | ICD-10-CM | POA: Diagnosis not present

## 2020-11-09 DIAGNOSIS — M25561 Pain in right knee: Secondary | ICD-10-CM

## 2020-11-09 DIAGNOSIS — M5431 Sciatica, right side: Secondary | ICD-10-CM

## 2020-11-09 NOTE — Progress Notes (Signed)
Subjective:    Patient ID: Jessica [name], female    DOB: 1955/10/10, 65 y.o.   MRN: [id number]  HPI  3/22 Patient fell in March 2019 and injured her lower back and her shoulder.  Since that time, she has had episodes of pain radiating down both legs.  However over the last 3 to 4 weeks has become constant.  She reports pain radiating into her posterior left gluteal area down her left leg all the way into her left foot.  The pain is a burning stinging pins-and-needles sensation consistent with sciatica.  She has severe pain trying to go up steps.  She also has occasional pains radiating down her right leg though nowhere near as severe as the left leg.  She denies any bowel or bladder incontinence.  She denies any saddle anesthesias.  She does report severe fatigue.  She states that she has a history of obstructive sleep apnea and is not wearing her machine at night because she cannot tolerate it.  This was diagnosed many years ago.  She states that she has no energy and hypersomnolence.  She would like lab work to check her thyroid.  At that time, my plan was: Patient symptoms sound like sciatica likely due to lumbar degenerative disc disease.  Begin prednisone taper pack and Norco 5/325 1 p.o. every 6 hours as needed pain.  Reassess in 2 to 3 weeks.  If no better, I would proceed with an MRI of the lumbar spine to evaluate for nerve compression that may benefit from cortisone injections in the back.  Regarding her fatigue I will check a CBC, CMP, TSH.  If this is normal, I would recommend a repeat sleep study  11/09/20 MRI IMPRESSION: 1. Mild multilevel lumbar spondylosis including grade 1 anterolisthesis of L4 on L5. 2. Small right paracentral disc extrusion at T12-L1 with caudal extension of disc material. 3. No significant foraminal or canal stenosis at any level.  Patient states that the left-sided sciatica has improved gradually with time.  She is no longer having pain radiate into her left  buttocks or down her left leg.  However she is having significant pain in her right knee.  She states that it hurts to go up and down steps.  The pain is located anteriorly on either side of the patella.  It hurts to the point that she does not want to fully commit her weight to the knee.  She has tenderness along the medial and lateral joint lines.  She is also noticed some swelling in her anterior right shin.  The swelling extends down to the level of hyperpigmentation and subcutaneous fibrosis due to longstanding leg swelling.  She has chronic venous stasis changes in both legs. Past Medical History:  Diagnosis Date  . Anemia    per patient, had it years ago before hysterectomy years ago  . Arthritis   . Asthma   . Bronchitis   . Carpal tunnel syndrome of right wrist   . Chronic venous insufficiency   . GERD (gastroesophageal reflux disease)   . History of hiatal hernia   . Hypertension   . Pneumonia   . PONV (postoperative nausea and vomiting)    Past Surgical History:  Procedure Laterality Date  . ABDOMINAL HYSTERECTOMY    . CARPAL TUNNEL RELEASE Right 12/16/2019   Procedure: RIGHT CARPAL TUNNEL RELEASE;  Surgeon: Meredith Pel, MD;  Location: [place];  Service: Orthopedics;  Laterality: Right;  . COLONOSCOPY  06/05/2012  Procedure: COLONOSCOPY;  Surgeon: Rogene Houston, MD;  [facility];  Service: Endoscopy;  Laterality: N/A;  125-changed to 1225 Ann to notify pt  . NISSEN FUNDOPLICATION     x2  . SHOULDER ARTHROSCOPY WITH SUBACROMIAL DECOMPRESSION, ROTATOR CUFF REPAIR AND BICEP TENDON REPAIR Right 12/16/2019   Procedure: RIGHT SHOULDER ARTHROSCOPY, BICEPS TENODESIS, MINI OPEN ROTATOR CUFF TEAR REPAIR, DEBRIDEMENT;  Surgeon: Meredith Pel, MD;  Location: [facility];  Service: Orthopedics;  Laterality: Right;   Current Outpatient Medications on File Prior to Visit  Medication Sig Dispense Refill  . albuterol (VENTOLIN HFA) 108 (90 Base) MCG/ACT inhaler Inhale 2 puffs  into the lungs every 4 (four) hours as needed for wheezing or shortness of breath. 1 each 0  . aspirin EC 81 MG tablet Take 1 tablet (81 mg total) by mouth daily. 14 tablet 0  . Calcium-Phosphorus-Vitamin D (CITRACAL +D3 PO) Take 1 tablet by mouth daily with supper.    . cefdinir (OMNICEF) 300 MG capsule Take 1 capsule (300 mg total) by mouth 2 (two) times daily. 20 capsule 0  . cetirizine (ZYRTEC) 10 MG tablet Take 10 mg by mouth daily as needed (allergies.).     Marland Kitchen Coenzyme Q10-Vitamin E (QUNOL ULTRA COQ10) 100-150 MG-UNIT CAPS Take 1 capsule by mouth daily with supper.    . esomeprazole (NEXIUM) 20 MG capsule Take 20 mg by mouth daily with supper.    . fluticasone (FLONASE) 50 MCG/ACT nasal spray Place 2 sprays into both nostrils daily. (Patient taking differently: Place 2 sprays into both nostrils daily as needed for allergies.) 16 g 2  . furosemide (LASIX) 40 MG tablet Take 1 tablet by mouth once daily 90 tablet 0  . guaiFENesin (MUCINEX) 600 MG 12 hr tablet Take 1 tablet (600 mg total) by mouth 2 (two) times daily as needed for cough or to loosen phlegm. 30 tablet 0  . HYDROcodone-acetaminophen (NORCO) 5-325 MG tablet Take 1 tablet by mouth every 6 (six) hours as needed for moderate pain. 30 tablet 0  . naproxen sodium (ANAPROX) 220 MG tablet Take 220-440 mg by mouth 3 (three) times daily as needed (pain. (Max 3 tablets/24 hrs.)).     Marland Kitchen predniSONE (DELTASONE) 20 MG tablet Take 2 tablets (40 mg total) by mouth daily with breakfast. 10 tablet 0  . predniSONE (DELTASONE) 20 MG tablet 3 tabs poqday 1-2, 2 tabs poqday 3-4, 1 tab poqday 5-6 12 tablet 0  . ramipril (ALTACE) 5 MG capsule Take 1 capsule by mouth once daily 90 capsule 0   No current facility-administered medications on file prior to visit.   Allergies  Allergen Reactions  . Codeine Other (See Comments)    Severe headaches   . Tetanus Antitoxin Swelling and Other (See Comments)    Fever   . Tetanus Toxoids Swelling and Other (See  Comments)    fever  . Penicillins Rash   Social History   Socioeconomic History  . Marital status: Married    Spouse name: Not on file  . Number of children: Not on file  . Years of education: Not on file  . Highest education level: Not on file  Occupational History  . Not on file  Tobacco Use  . Smoking status: Never Smoker  . Smokeless tobacco: Never Used  Vaping Use  . Vaping Use: Never used  Substance and Sexual Activity  . Alcohol use: No  . Drug use: No  . Sexual activity: Not on file  Other Topics  Concern  . Not on file  Social History Narrative  . Not on file   Social Determinants of Health   Financial Resource Strain: Not on file  Food Insecurity: Not on file  Transportation Needs: Not on file  Physical Activity: Not on file  Stress: Not on file  Social Connections: Not on file  Intimate Partner Violence: Not on file      Review of Systems  All other systems reviewed and are negative.      Objective:   Physical Exam Neck:     Vascular: No JVD.  Cardiovascular:     Rate and Rhythm: Normal rate and regular rhythm.     Heart sounds: Normal heart sounds. No murmur heard.   Pulmonary:     Effort: Pulmonary effort is normal. No respiratory distress.     Breath sounds: Normal breath sounds. No stridor. No wheezing or rales.  Musculoskeletal:     Lumbar back: Tenderness present. No bony tenderness. Decreased range of motion.     Right knee: Decreased range of motion. Tenderness present over the medial joint line and lateral joint line.  Skin:        Chronic venous stasis changes in both legs.        Assessment & Plan:   Bilateral sciatica  Acute pain of right knee  Thankfully, the sciatica has improved.  MRI is relatively normal aside from a bulging disc at T12-L1 with no significant impression on the spinal cord.  At the present time no treatment is necessary for this.  However she is having bilateral knee pain right greater than left and  the right knee seems to be moderate to severe in nature.  I suspect osteoarthritis.  She is already taken Tylenol and naproxen.  Therefore she is requesting a cortisone injection in her knee.  Using sterile technique, I injected the right knee with 2 cc lidocaine, 2 cc of Marcaine, and 2 cc of 40 mg/mL Kenalog.

## 2020-11-21 ENCOUNTER — Other Ambulatory Visit: Payer: Self-pay | Admitting: Family Medicine

## 2020-12-22 ENCOUNTER — Other Ambulatory Visit: Payer: Self-pay

## 2020-12-22 ENCOUNTER — Emergency Department (HOSPITAL_COMMUNITY)
Admission: EM | Admit: 2020-12-22 | Discharge: 2020-12-22 | Disposition: A | Discharge status: Home / Self Care | Payer: Medicare Other | Attending: Emergency Medicine | Admitting: Emergency Medicine

## 2020-12-22 ENCOUNTER — Encounter (HOSPITAL_COMMUNITY): Payer: Self-pay | Admitting: Emergency Medicine

## 2020-12-22 ENCOUNTER — Emergency Department (HOSPITAL_COMMUNITY): Payer: Medicare Other

## 2020-12-22 DIAGNOSIS — S0990XA Unspecified injury of head, initial encounter: Secondary | ICD-10-CM | POA: Diagnosis present

## 2020-12-22 DIAGNOSIS — S0003XA Contusion of scalp, initial encounter: Secondary | ICD-10-CM | POA: Diagnosis not present

## 2020-12-22 DIAGNOSIS — M79602 Pain in left arm: Secondary | ICD-10-CM | POA: Diagnosis not present

## 2020-12-22 DIAGNOSIS — Z79899 Other long term (current) drug therapy: Secondary | ICD-10-CM | POA: Diagnosis not present

## 2020-12-22 DIAGNOSIS — J45909 Unspecified asthma, uncomplicated: Secondary | ICD-10-CM | POA: Diagnosis not present

## 2020-12-22 DIAGNOSIS — W07XXXA Fall from chair, initial encounter: Secondary | ICD-10-CM | POA: Diagnosis not present

## 2020-12-22 DIAGNOSIS — M542 Cervicalgia: Secondary | ICD-10-CM

## 2020-12-22 DIAGNOSIS — I10 Essential (primary) hypertension: Secondary | ICD-10-CM | POA: Insufficient documentation

## 2020-12-22 DIAGNOSIS — W19XXXA Unspecified fall, initial encounter: Secondary | ICD-10-CM

## 2020-12-22 NOTE — ED Triage Notes (Signed)
Pt c/o falling and hitting the left side of head on a bench. Pt denies LOC and not on blood thinners

## 2020-12-22 NOTE — Discharge Instructions (Addendum)
Tylenol as needed as directed for headaches. Recheck with your doctor as needed, return to ER for worsening or concerning symptoms.

## 2020-12-22 NOTE — ED Provider Notes (Signed)
[facility] EMERGENCY DEPARTMENT Provider Note   CSN: [id number] Arrival date & time: 12/22/20  2047     History Chief Complaint  Patient presents with   Jessica [name] is a 65 y.o. female.  65 year old female presents for evaluation after a fall. Patient was sitting in a chair on her deck, did not realize the leg of the chair was between planks on the deck and the chair fell to the left, patient struck the left side of her head on a bench on the deck.  Reports pain in her head, neck and mild left arm pain.  No LOC, not anticoagulated.  No other injuries, complaints or concerns.      Past Medical History:  Diagnosis Date   Anemia    per patient, had it years ago before hysterectomy years ago   Arthritis    Asthma    Bronchitis    Carpal tunnel syndrome of right wrist    Chronic venous insufficiency    GERD (gastroesophageal reflux disease)    History of hiatal hernia    Hypertension    Pneumonia    PONV (postoperative nausea and vomiting)     Patient Active Problem List   Diagnosis Date Noted   Upper respiratory tract infection 07/18/2020   Hypertension 04/12/2019   Glucose intolerance 04/12/2019   Chronic venous insufficiency 04/06/2019   Osteopenia 04/06/2019    Past Surgical History:  Procedure Laterality Date   ABDOMINAL HYSTERECTOMY     CARPAL TUNNEL RELEASE Right 12/16/2019   Procedure: RIGHT CARPAL TUNNEL RELEASE;  Surgeon: Meredith Pel, MD;  Location: [place];  Service: Orthopedics;  Laterality: Right;   COLONOSCOPY  06/05/2012   Procedure: COLONOSCOPY;  Surgeon: Rogene Houston, MD;  [facility];  Service: Endoscopy;  Laterality: N/A;  125-changed to 1225 Ann to notify pt   NISSEN FUNDOPLICATION     x2   SHOULDER ARTHROSCOPY WITH SUBACROMIAL DECOMPRESSION, ROTATOR CUFF REPAIR AND BICEP TENDON REPAIR Right 12/16/2019   Procedure: RIGHT SHOULDER ARTHROSCOPY, BICEPS TENODESIS, MINI OPEN ROTATOR CUFF TEAR REPAIR, DEBRIDEMENT;   Surgeon: Meredith Pel, MD;  Location: [place];  Service: Orthopedics;  Laterality: Right;     OB History   No obstetric history on file.     No family history on file.  Social History   Tobacco Use   Smoking status: Never   Smokeless tobacco: Never  Vaping Use   Vaping Use: Never used  Substance Use Topics   Alcohol use: No   Drug use: No    Home Medications Prior to Admission medications   Medication Sig Start Date End Date Taking? Authorizing Provider  albuterol (VENTOLIN HFA) 108 (90 Base) MCG/ACT inhaler Inhale 2 puffs into the lungs every 4 (four) hours as needed for wheezing or shortness of breath. 07/18/20   Eulogio Bear, NP  Calcium-Phosphorus-Vitamin D (CITRACAL +D3 PO) Take 1 tablet by mouth daily with supper.    [provider]  cefdinir (OMNICEF) 300 MG capsule Take 1 capsule (300 mg total) by mouth 2 (two) times daily. 07/25/20   Susy Frizzle, MD  cetirizine (ZYRTEC) 10 MG tablet Take 10 mg by mouth daily as needed (allergies.).     [provider]  Coenzyme Q10-Vitamin E (QUNOL ULTRA COQ10) 100-150 MG-UNIT CAPS Take 1 capsule by mouth daily with supper.    [provider]  esomeprazole (NEXIUM) 20 MG capsule Take 20 mg by mouth daily with supper.  [provider]  fluticasone (FLONASE) 50 MCG/ACT nasal spray Place 2 sprays into both nostrils daily. Patient taking differently: Place 2 sprays into both nostrils daily as needed for allergies. 06/19/18   Delsa Grana, PA-C  furosemide (LASIX) 40 MG tablet Take 1 tablet by mouth once daily 10/10/20   Susy Frizzle, MD  guaiFENesin (MUCINEX) 600 MG 12 hr tablet Take 1 tablet (600 mg total) by mouth 2 (two) times daily as needed for cough or to loosen phlegm. 07/18/20   Eulogio Bear, NP  HYDROcodone-acetaminophen (NORCO) 5-325 MG tablet Take 1 tablet by mouth every 6 (six) hours as needed for moderate pain. Patient not taking: Reported on 11/09/2020 10/12/20    Susy Frizzle, MD  naproxen sodium (ANAPROX) 220 MG tablet Take 220-440 mg by mouth 3 (three) times daily as needed (pain. (Max 3 tablets/24 hrs.)).     [provider]  predniSONE (DELTASONE) 20 MG tablet 3 tabs poqday 1-2, 2 tabs poqday 3-4, 1 tab poqday 5-6 10/12/20   Susy Frizzle, MD  ramipril (ALTACE) 5 MG capsule Take 1 capsule by mouth once daily 10/10/20   Susy Frizzle, MD    Allergies    Codeine, Tetanus antitoxin, Tetanus toxoids, and Penicillins  Review of Systems   Review of Systems  Constitutional:  Negative for fever.  Eyes:  Negative for visual disturbance.  Respiratory:  Negative for shortness of breath.   Cardiovascular:  Negative for chest pain.  Gastrointestinal:  Negative for nausea and vomiting.  Musculoskeletal:  Positive for myalgias and neck pain. Negative for back pain, gait problem and joint swelling.  Skin:  Negative for rash and wound.  Allergic/Immunologic: Negative for immunocompromised state.  Neurological:  Positive for headaches.  Hematological:  Does not bruise/bleed easily.  Psychiatric/Behavioral:  Negative for confusion.   All other systems reviewed and are negative.  Physical Exam Updated Vital Signs BP (!) 171/107   Pulse 84   Temp 98 F (36.7 C)   Resp 18   Ht 5\' 3"  (1.6 m)   Wt 131.5 kg   SpO2 99%   BMI 51.37 kg/m   Physical Exam Vitals and nursing note reviewed.  Constitutional:      General: She is not in acute distress.    Appearance: She is well-developed. She is not diaphoretic.  HENT:     Head: Normocephalic and atraumatic.     Nose: Nose normal.     Mouth/Throat:     Mouth: Mucous membranes are moist.  Eyes:     Extraocular Movements: Extraocular movements intact.     Pupils: Pupils are equal, round, and reactive to light.  Pulmonary:     Effort: Pulmonary effort is normal.  Musculoskeletal:        General: No swelling, deformity or signs of injury.     Cervical back: Tenderness present. No bony  tenderness. Pain with movement present.     Thoracic back: No tenderness or bony tenderness.     Lumbar back: No tenderness or bony tenderness.       Back:     Comments: Tenderness with patient in left neck, no midline or bony tenderness. Does have some pain with  Skin:    General: Skin is warm and dry.     Findings: No erythema or rash.  Neurological:     Mental Status: She is alert and oriented to person, place, and time.  Psychiatric:        Behavior: Behavior normal.  ED Results / Procedures / Treatments   Labs (all labs ordered are listed, but only abnormal results are displayed) Labs Reviewed - No data to display  EKG None  Radiology CT Head Wo Contrast  Result Date: 12/22/2020 CLINICAL DATA:  The patient fell and hit the left side of her head. EXAM: CT HEAD WITHOUT CONTRAST CT CERVICAL SPINE WITHOUT CONTRAST TECHNIQUE: Multidetector CT imaging of the head and cervical spine was performed following the standard protocol without intravenous contrast. Multiplanar CT image reconstructions of the cervical spine were also generated. COMPARISON:  None. FINDINGS: CT HEAD FINDINGS Brain: Mildly enlarged ventricles and cortical sulci. No intracranial hemorrhage, mass lesion or CT evidence of acute infarction. Vascular: No hyperdense vessel or unexpected calcification. Skull: Normal. Negative for fracture or focal lesion. Sinuses/Orbits: Unremarkable. Other: Right concha bullosa. CT CERVICAL SPINE FINDINGS Alignment: Mild reversal of the normal cervical lordosis. No subluxations. Limited by photon starvation in the cervicothoracic region. Skull base and vertebrae: Limited by photon starvation in the cervicothoracic region. No visible acute fracture. No primary bone lesion or focal pathologic process. Soft tissues and spinal canal: No prevertebral fluid or swelling. No visible canal hematoma. Disc levels:  Mild multilevel degenerative changes. Upper chest: Clear lung apices. Other: None.  IMPRESSION: 1. No skull fracture or intracranial hemorrhage. 2. No cervical spine fracture or subluxation. 3. Mild diffuse cerebral and cerebellar atrophy. 4. Mild cervical spine degenerative changes. Electronically Signed   By: Claudie Revering M.D.   On: 12/22/2020 22:48   CT Cervical Spine Wo Contrast  Result Date: 12/22/2020 CLINICAL DATA:  The patient fell and hit the left side of her head. EXAM: CT HEAD WITHOUT CONTRAST CT CERVICAL SPINE WITHOUT CONTRAST TECHNIQUE: Multidetector CT imaging of the head and cervical spine was performed following the standard protocol without intravenous contrast. Multiplanar CT image reconstructions of the cervical spine were also generated. COMPARISON:  None. FINDINGS: CT HEAD FINDINGS Brain: Mildly enlarged ventricles and cortical sulci. No intracranial hemorrhage, mass lesion or CT evidence of acute infarction. Vascular: No hyperdense vessel or unexpected calcification. Skull: Normal. Negative for fracture or focal lesion. Sinuses/Orbits: Unremarkable. Other: Right concha bullosa. CT CERVICAL SPINE FINDINGS Alignment: Mild reversal of the normal cervical lordosis. No subluxations. Limited by photon starvation in the cervicothoracic region. Skull base and vertebrae: Limited by photon starvation in the cervicothoracic region. No visible acute fracture. No primary bone lesion or focal pathologic process. Soft tissues and spinal canal: No prevertebral fluid or swelling. No visible canal hematoma. Disc levels:  Mild multilevel degenerative changes. Upper chest: Clear lung apices. Other: None. IMPRESSION: 1. No skull fracture or intracranial hemorrhage. 2. No cervical spine fracture or subluxation. 3. Mild diffuse cerebral and cerebellar atrophy. 4. Mild cervical spine degenerative changes. Electronically Signed   By: Claudie Revering M.D.   On: 12/22/2020 22:48    Procedures Procedures   Medications Ordered in ED Medications - No data to display  ED Course  I have reviewed  the triage vital signs and the nursing notes.  Pertinent labs & imaging results that were available during my care of the patient were reviewed by me and considered in my medical decision making (see chart for details).  Clinical Course as of 12/22/20 2302  Fri Dec 22, 8017  [id number] 65 year old female with complaint of pain in the left side of her head and neck after fall earlier this evening as above.  Added tenderness left side scalp as well as left-sided neck, not anticoagulated.  CT head and  C-spine negative for acute injury. Discussed results with patient at discharge however she has just found out via telephone that her twin brother has died 55 and is ready for discharge to be with family.  Recommend follow-up with PCP as needed, return to ED for new or worsening symptoms. [LM]    Clinical Course User Index [LM] Roque Lias   MDM Rules/Calculators/A&P                           Final Clinical Impression(s) / ED Diagnoses Final diagnoses:  Fall, initial encounter  Contusion of scalp, initial encounter  Neck pain    Rx / DC Orders ED Discharge Orders     None        Roque Lias 12/22/20 2302    Luna Fuse, MD 12/22/20 2307

## 2020-12-22 NOTE — ED Provider Notes (Signed)
Emergency Medicine Provider Triage Evaluation Note  Jessica [name] , a 65 y.o. female  was evaluated in triage.  Pt complains of fall. Patient was sitting in a chair on her deck, did not realize the leg of the chair was between planks on the deck and the chair fell to the left, patient struck the left side of her head on a bench on the deck.  Reports pain in her head, neck and mild left arm pain.  No LOC, not anticoagulated.  No other injuries, complaints or concerns.  Review of Systems  Positive: Headache, neck pain Negative: LOC  Physical Exam  BP (!) 171/107   Pulse 84   Temp 98 F (36.7 C)   Resp 18   Ht 5\' 3"  (1.6 m)   Wt 131.5 kg   SpO2 99%   BMI 51.37 kg/m  Gen:   Awake, no distress   Resp:  Normal effort  MSK:   Moves extremities without difficulty  Other:    Medical Decision Making  Medically screening exam initiated at 9:26 PM.  Appropriate orders placed.  Jackelyn Poling Kenney was informed that the remainder of the evaluation will be completed by another provider, this initial triage assessment does not replace that evaluation, and the importance of remaining in the ED until their evaluation is complete.     Tacy Learn, PA-C 12/22/20 2129    Luna Fuse, MD 12/22/20 2221

## 2021-01-08 ENCOUNTER — Telehealth: Payer: Self-pay | Admitting: *Deleted

## 2021-01-08 NOTE — Telephone Encounter (Signed)
Patient tested positive for COVID on 12/22/2020.   Reports that she continues to have cough and sore throat from nasal drainage.   Advised to continue symptom management with OTC medications: Tylenol/ Ibuprofen for fever/ body aches, Mucinex/ Delsym for cough/ chest congestion, Afrin/Sudafed/nasal saline for sinus pressure/ nasal congestion  If chest pain, shortness of breath, fever >104 that is unresponsive to antipyretics noted, advised to go to ER for evaluation.   Advised that the CDC recommends the following criteria prior to ending isolation in vaccinated persons at least 5 days since symptoms onset, then 5 days of masking, AND 3 days fever free without antipyretics (Tylenol or Ibuprofen) AND improvement in respiratory symptoms.

## 2021-02-03 ENCOUNTER — Other Ambulatory Visit: Payer: Self-pay | Admitting: Family Medicine

## 2021-04-02 ENCOUNTER — Telehealth: Payer: Self-pay | Admitting: Family Medicine

## 2021-04-02 NOTE — Telephone Encounter (Signed)
I attempted to [place] for patient to call back and schedule Medicare Annual Wellness Visit (AWV) in office. No answer and no voice mail.  If not able to come in office, please offer to do virtually or by telephone.  Left office number and my jabber [phone].  Due for AWVI  Please schedule at anytime with Nurse Health Advisor.

## 2021-05-29 ENCOUNTER — Other Ambulatory Visit: Payer: Self-pay | Admitting: Family Medicine

## 2021-07-17 ENCOUNTER — Other Ambulatory Visit: Payer: Self-pay

## 2021-07-17 ENCOUNTER — Other Ambulatory Visit: Payer: Medicare Other

## 2021-07-17 DIAGNOSIS — I10 Essential (primary) hypertension: Secondary | ICD-10-CM

## 2021-07-17 DIAGNOSIS — E7439 Other disorders of intestinal carbohydrate absorption: Secondary | ICD-10-CM

## 2021-07-17 DIAGNOSIS — Z13220 Encounter for screening for lipoid disorders: Secondary | ICD-10-CM

## 2021-07-17 DIAGNOSIS — R7309 Other abnormal glucose: Secondary | ICD-10-CM

## 2021-07-17 DIAGNOSIS — Z0000 Encounter for general adult medical examination without abnormal findings: Secondary | ICD-10-CM

## 2021-07-18 LAB — CBC WITH DIFFERENTIAL/PLATELET
Absolute Monocytes: 640 cells/uL (ref 200–950)
Basophils Absolute: 40 cells/uL (ref 0–200)
Basophils Relative: 0.6 %
Eosinophils Absolute: 152 cells/uL (ref 15–500)
Eosinophils Relative: 2.3 %
HCT: 40.6 % (ref 35.0–45.0)
Hemoglobin: 13.1 g/dL (ref 11.7–15.5)
Lymphs Abs: 2323 cells/uL (ref 850–3900)
MCH: 27.8 pg (ref 27.0–33.0)
MCHC: 32.3 g/dL (ref 32.0–36.0)
MCV: 86 fL (ref 80.0–100.0)
MPV: 11.6 fL (ref 7.5–12.5)
Monocytes Relative: 9.7 %
Neutro Abs: 3445 cells/uL (ref 1500–7800)
Neutrophils Relative %: 52.2 %
Platelets: 197 10*3/uL (ref 140–400)
RBC: 4.72 10*6/uL (ref 3.80–5.10)
RDW: 14.1 % (ref 11.0–15.0)
Total Lymphocyte: 35.2 %
WBC: 6.6 10*3/uL (ref 3.8–10.8)

## 2021-07-18 LAB — COMPREHENSIVE METABOLIC PANEL
AG Ratio: 1.2 (calc) (ref 1.0–2.5)
ALT: 12 U/L (ref 6–29)
AST: 15 U/L (ref 10–35)
Albumin: 3.9 g/dL (ref 3.6–5.1)
Alkaline phosphatase (APISO): 94 U/L (ref 37–153)
BUN: 19 mg/dL (ref 7–25)
CO2: 31 mmol/L (ref 20–32)
Calcium: 9.8 mg/dL (ref 8.6–10.4)
Chloride: 104 mmol/L (ref 98–110)
Creat: 0.93 mg/dL (ref 0.50–1.05)
Globulin: 3.2 g/dL (calc) (ref 1.9–3.7)
Glucose, Bld: 104 mg/dL — ABNORMAL HIGH (ref 65–99)
Potassium: 4.9 mmol/L (ref 3.5–5.3)
Sodium: 141 mmol/L (ref 135–146)
Total Bilirubin: 0.4 mg/dL (ref 0.2–1.2)
Total Protein: 7.1 g/dL (ref 6.1–8.1)

## 2021-07-18 LAB — LIPID PANEL
Cholesterol: 191 mg/dL (ref <200)
HDL: 44 mg/dL — ABNORMAL LOW (ref >=50)
LDL Cholesterol (Calc): 127 mg/dL (calc) — ABNORMAL HIGH
Non-HDL Cholesterol (Calc): 147 mg/dL (calc) — ABNORMAL HIGH (ref <130)
Total CHOL/HDL Ratio: 4.3 (calc) (ref <5.0)
Triglycerides: 101 mg/dL (ref <150)

## 2021-07-18 LAB — HEMOGLOBIN A1C
Hgb A1c MFr Bld: 5.8 % of total Hgb — ABNORMAL HIGH (ref <5.7)
Mean Plasma Glucose: 120 mg/dL
eAG (mmol/L): 6.6 mmol/L

## 2021-07-18 LAB — TSH: TSH: 3.49 mIU/L (ref 0.40–4.50)

## 2021-07-20 ENCOUNTER — Other Ambulatory Visit: Payer: Self-pay

## 2021-07-20 ENCOUNTER — Encounter: Payer: Self-pay | Admitting: Family Medicine

## 2021-07-20 ENCOUNTER — Ambulatory Visit (INDEPENDENT_AMBULATORY_CARE_PROVIDER_SITE_OTHER): Payer: Medicare Other | Admitting: Family Medicine

## 2021-07-20 VITALS — BP 140/80 | HR 78 | Ht 62.0 in | Wt 288.0 lb

## 2021-07-20 DIAGNOSIS — E7800 Pure hypercholesterolemia, unspecified: Secondary | ICD-10-CM

## 2021-07-20 DIAGNOSIS — Z0000 Encounter for general adult medical examination without abnormal findings: Secondary | ICD-10-CM | POA: Diagnosis not present

## 2021-07-20 DIAGNOSIS — M1711 Unilateral primary osteoarthritis, right knee: Secondary | ICD-10-CM | POA: Diagnosis not present

## 2021-07-20 DIAGNOSIS — E7439 Other disorders of intestinal carbohydrate absorption: Secondary | ICD-10-CM

## 2021-07-20 DIAGNOSIS — I10 Essential (primary) hypertension: Secondary | ICD-10-CM | POA: Diagnosis not present

## 2021-07-20 DIAGNOSIS — I872 Venous insufficiency (chronic) (peripheral): Secondary | ICD-10-CM | POA: Diagnosis not present

## 2021-07-20 MED ORDER — ROSUVASTATIN CALCIUM 10 MG PO TABS: 10.0000 mg | ORAL_TABLET | Freq: Every day | ORAL | 3 refills | Status: DC

## 2021-07-20 NOTE — Progress Notes (Signed)
Subjective:    Patient ID: Jessica [name], female    DOB: Nov 27, 1955, 66 y.o.   MRN: [id number]  HPI  Patient is a very pleasant 66 year old Caucasian female who is here today for complete physical.  She has several concerns.  First she has chronic venous insufficiency.  She has hyperpigmentation and fibrotic skin changes on her lower extremities due to chronic venous insufficiency.  She has numerous varicose veins and constant pain in her lower legs due to the swelling.  She is not using compression hose.  She does take a diuretic as needed for swelling however her swelling is controlled.  She does have tenderness to palpation circumferentially around both legs from her ankles to her mid shins.  She would like to see a vascular specialist to discuss other options that she has.  She also reports bilateral knee pain right greater than left.  The cortisone injection that I gave her earlier this year again for no benefit unfortunately she has significant osteoarthritis of the left femur orthopedic surgeon to discuss surgical replacement.  She is due for Prevnar 20.  The remainder of her vaccinations are up-to-date except for Shingrix.  She is due for mammogram.  She is due for colonoscopy.  She defers these at the present time but scheduled them for self later this year.  Currently she is having to care for her mother who is nearing the end of her life and also has a grandchild arriving any day. Past Medical History:  Diagnosis Date   Anemia    per patient, had it years ago before hysterectomy years ago   Arthritis    Asthma    Bronchitis    Carpal tunnel syndrome of right wrist    Chronic venous insufficiency    GERD (gastroesophageal reflux disease)    History of hiatal hernia    Hypertension    Pneumonia    PONV (postoperative nausea and vomiting)    Past Surgical History:  Procedure Laterality Date   ABDOMINAL HYSTERECTOMY     CARPAL TUNNEL RELEASE Right 12/16/2019   Procedure: RIGHT  CARPAL TUNNEL RELEASE;  Surgeon: Meredith Pel, MD;  Location: [place];  Service: Orthopedics;  Laterality: Right;   COLONOSCOPY  06/05/2012   Procedure: COLONOSCOPY;  Surgeon: Rogene Houston, MD;  [facility];  Service: Endoscopy;  Laterality: N/A;  125-changed to 1225 Ann to notify pt   NISSEN FUNDOPLICATION     x2   SHOULDER ARTHROSCOPY WITH SUBACROMIAL DECOMPRESSION, ROTATOR CUFF REPAIR AND BICEP TENDON REPAIR Right 12/16/2019   Procedure: RIGHT SHOULDER ARTHROSCOPY, BICEPS TENODESIS, MINI OPEN ROTATOR CUFF TEAR REPAIR, DEBRIDEMENT;  Surgeon: Meredith Pel, MD;  Location: [place];  Service: Orthopedics;  Laterality: Right;   Current Outpatient Medications on File Prior to Visit  Medication Sig Dispense Refill   cetirizine (ZYRTEC) 10 MG tablet Take 10 mg by mouth daily as needed (allergies.).      esomeprazole (NEXIUM) 20 MG capsule Take 20 mg by mouth daily with supper.     furosemide (LASIX) 40 MG tablet Take 1 tablet by mouth once daily 90 tablet 0   guaiFENesin (MUCINEX) 600 MG 12 hr tablet Take 1 tablet (600 mg total) by mouth 2 (two) times daily as needed for cough or to loosen phlegm. 30 tablet 0   naproxen sodium (ANAPROX) 220 MG tablet Take 220-440 mg by mouth 3 (three) times daily as needed (pain. (Max 3 tablets/24 hrs.)).      ramipril (  ALTACE) 5 MG capsule Take 1 capsule by mouth once daily 90 capsule 0   No current facility-administered medications on file prior to visit.   Allergies  Allergen Reactions   Codeine Other (See Comments)    Severe headaches    Tetanus Antitoxin Swelling and Other (See Comments)    Fever    Tetanus Toxoids Swelling and Other (See Comments)    fever   Penicillins Rash   Social History   Socioeconomic History   Marital status: Married    Spouse name: Not on file   Number of children: Not on file   Years of education: Not on file   Highest education level: Not on file  Occupational History   Not on file  Tobacco  Use   Smoking status: Never   Smokeless tobacco: Never  Vaping Use   Vaping Use: Never used  Substance and Sexual Activity   Alcohol use: No   Drug use: No   Sexual activity: Not on file  Other Topics Concern   Not on file  Social History Narrative   Not on file   Social Determinants of Health   Financial Resource Strain: Not on file  Food Insecurity: Not on file  Transportation Needs: Not on file  Physical Activity: Not on file  Stress: Not on file  Social Connections: Not on file  Intimate Partner Violence: Not on file      Review of Systems  All other systems reviewed and are negative.     Objective:   Physical Exam Neck:     Vascular: No JVD.  Cardiovascular:     Rate and Rhythm: Normal rate and regular rhythm.     Heart sounds: Normal heart sounds. No murmur heard. Pulmonary:     Effort: Pulmonary effort is normal. No respiratory distress.     Breath sounds: Normal breath sounds. No stridor. No wheezing or rales.  Musculoskeletal:     Lumbar back: Tenderness present. No bony tenderness. Decreased range of motion.       Legs:  Skin:       Chronic venous stasis changes in both legs.        Assessment & Plan:   Venous insufficiency - Plan: Ambulatory referral to Vascular Surgery  Primary osteoarthritis of right knee - Plan: Ambulatory referral to Orthopedic Surgery  General medical exam  Primary hypertension  Glucose intolerance  Pure hypercholesterolemia Recommended a mammogram and a colonoscopy.  Patient defers these at the present time.  Recommended Pneumovax 20 and Shingrix.  Consult vascular surgery but I did recommend the patient try compression hose and horse Chestnut seed extract for the pain in her legs due to chronic venous insufficiency.  Consult orthopedic surgery due to severe osteoarthritis in her right knee.  Recommended Crestor 10 mg a day due to her history of hyperlipidemia and her family history of cardiovascular disease and her  personal history of hypertension.  The remainder of her lab work is acceptable.  Recommended diet exercise and weight loss.

## 2021-07-22 ENCOUNTER — Encounter: Payer: Self-pay | Admitting: Family Medicine

## 2021-07-26 ENCOUNTER — Encounter: Payer: Self-pay | Admitting: Family Medicine

## 2021-07-26 DIAGNOSIS — I872 Venous insufficiency (chronic) (peripheral): Secondary | ICD-10-CM

## 2021-08-10 ENCOUNTER — Telehealth: Payer: Self-pay | Admitting: Family Medicine

## 2021-08-10 NOTE — Telephone Encounter (Signed)
Patient returned missed call; unsure if call was for her or for her mother.

## 2021-08-15 ENCOUNTER — Ambulatory Visit (INDEPENDENT_AMBULATORY_CARE_PROVIDER_SITE_OTHER): Payer: Medicare Other

## 2021-08-15 ENCOUNTER — Ambulatory Visit: Payer: Self-pay

## 2021-08-15 ENCOUNTER — Telehealth: Payer: Self-pay

## 2021-08-15 ENCOUNTER — Ambulatory Visit (INDEPENDENT_AMBULATORY_CARE_PROVIDER_SITE_OTHER): Payer: Medicare Other | Admitting: Orthopedic Surgery

## 2021-08-15 ENCOUNTER — Other Ambulatory Visit: Payer: Self-pay

## 2021-08-15 VITALS — Ht 62.75 in | Wt 292.0 lb

## 2021-08-15 DIAGNOSIS — M170 Bilateral primary osteoarthritis of knee: Secondary | ICD-10-CM

## 2021-08-15 DIAGNOSIS — G8929 Other chronic pain: Secondary | ICD-10-CM

## 2021-08-15 DIAGNOSIS — M25561 Pain in right knee: Secondary | ICD-10-CM

## 2021-08-15 DIAGNOSIS — M25562 Pain in left knee: Secondary | ICD-10-CM

## 2021-08-15 DIAGNOSIS — M7989 Other specified soft tissue disorders: Secondary | ICD-10-CM

## 2021-08-15 NOTE — Telephone Encounter (Signed)
Auth needed for bilat knee gel injections  

## 2021-08-15 NOTE — Telephone Encounter (Signed)
Noted  

## 2021-08-19 ENCOUNTER — Encounter: Payer: Self-pay | Admitting: Orthopedic Surgery

## 2021-08-19 MED ORDER — METHYLPREDNISOLONE ACETATE 40 MG/ML IJ SUSP
40.0000 mg | INTRAMUSCULAR | Status: AC | PRN
  Administered 2021-08-15: 40 mg via INTRA_ARTICULAR

## 2021-08-19 MED ORDER — BUPIVACAINE HCL 0.25 % IJ SOLN
4.0000 mL | INTRAMUSCULAR | Status: AC | PRN
  Administered 2021-08-15: 4 mL via INTRA_ARTICULAR

## 2021-08-19 MED ORDER — LIDOCAINE HCL 1 % IJ SOLN
5.0000 mL | INTRAMUSCULAR | Status: AC | PRN
  Administered 2021-08-15: 5 mL

## 2021-08-19 NOTE — Progress Notes (Signed)
Office Visit Note   Patient: Jessica [name]           Date of Birth: 04/01/1956           MRN: [id number] Visit Date: 08/15/2021 Requested by: Susy Frizzle, MD [address] [place],  [place] [address] PCP: Susy Frizzle, MD  Subjective: Chief Complaint  Patient presents with   Right Knee - Pain   Left Knee - Pain    HPI: Jessica [name] is a 66 y.o. female who presents to the office complaining of bilateral knee pain.  Patient notes bilateral knee pain over the last year that has been getting worse.  She has difficulty with stairs and feels like her knee wants to give way on her at times.  She has had an injection into her knees by her PCP that did not provide any significant relief.  She helps take care of her mother.  Taking naproxen 3 times daily.  Not interested in any surgical intervention for her knees.  Denies any history of diabetes.  Does have history of sciatic pain in early 2022 and was told she had a bulging disc and took a steroid Dosepak with good relief.  Currently denying any recurrent radicular pain at this time.  No groin pain.  Most of her pain in her knees is in her right knee and more medial than lateral.  She also complains of right lower leg swelling in the lateral calf.  She was seen at [facility] and they recommended ablation..                ROS: All systems reviewed are negative as they relate to the chief complaint within the history of present illness.  Patient denies fevers or chills.  Assessment & Plan: Visit Diagnoses:  1. Chronic pain of both knees   2. Right leg swelling     Plan: Patient is a 66 year old female who presents for evaluation of bilateral knee pain and right calf swelling.  She has bilateral knee osteoarthritis noted on today's radiographs, primarily localized to the patellofemoral compartment in the left knee and tricompartmental in the right knee.  Discussed options available to patient.  She does not want to pursue  surgery at this time.  She would like to try cortisone injections.  Previous cortisone injection by her PCP did not help.  Bilateral knee cortisone injections administered today and patient tolerated the procedure well.  Also plan to preapproved patient for gel injections given her lack of response to cortisone in the past.  Follow-up with the office after approval of gel injections.  We are not well versed in the risk and benefits of further vein ablation to treat pain in her calf region and the leg but already has venous stasis.  Cannot make recommendations for or against that procedure at this time.  This patient is diagnosed with osteoarthritis of the knee(s).    Radiographs show evidence of joint space narrowing, osteophytes, subchondral sclerosis and/or subchondral cysts.  This patient has knee pain which interferes with functional and activities of daily living.    This patient has experienced inadequate response, adverse effects and/or intolerance with conservative treatments such as acetaminophen, NSAIDS, topical creams, physical therapy or regular exercise, knee bracing and/or weight loss.   This patient has experienced inadequate response or has a contraindication to intra articular steroid injections for at least 3 months.   This patient is not scheduled to have a total  knee replacement within 6 months of starting treatment with viscosupplementation.   Follow-Up Instructions: No follow-ups on file.   Orders:  Orders Placed This Encounter  Procedures   XR Knee 1-2 Views Right   XR KNEE 3 VIEW LEFT   XR Tibia/Fibula Right   No orders of the defined types were placed in this encounter.     Procedures: Large Joint Inj: bilateral knee on 08/15/2021 7:01 PM Indications: diagnostic evaluation, joint swelling and pain Details: 18 G 1.5 in needle, superolateral approach  Arthrogram: No  Medications (Right): 5 mL lidocaine 1 %; 4 mL bupivacaine 0.25 %; 40 mg methylPREDNISolone  acetate 40 MG/ML Medications (Left): 5 mL lidocaine 1 %; 4 mL bupivacaine 0.25 %; 40 mg methylPREDNISolone acetate 40 MG/ML Outcome: tolerated well, no immediate complications Procedure, treatment alternatives, risks and benefits explained, specific risks discussed. Consent was given by the patient. Immediately prior to procedure a time out was called to verify the correct patient, procedure, equipment, support staff and site/side marked as required. Patient was prepped and draped in the usual sterile fashion.      Clinical Data: No additional findings.  Objective: Vital Signs: Ht 5' 2.75" (1.594 m)    Wt 292 lb (132.5 kg)    BMI 52.14 kg/m   Physical Exam:  Constitutional: Patient appears well-developed HEENT:  Head: Normocephalic Eyes:EOM are normal Neck: Normal range of motion Cardiovascular: Normal rate Pulmonary/chest: Effort normal Neurologic: Patient is alert Skin: Skin is warm Psychiatric: Patient has normal mood and affect  Ortho Exam: Ortho exam demonstrates bilateral knees with no significant effusion.  0 degrees extension and 110 degrees of knee flexion in both knees.  Tenderness over the medial and lateral joint lines, more so over the medial joint line.  No pain with hip range of motion bilaterally.  Negative straight leg raise bilaterally.  Specialty Comments:  No specialty comments available.  Imaging: No results found.   PMFS History: Patient Active Problem List   Diagnosis Date Noted   Upper respiratory tract infection 07/18/2020   Hypertension 04/12/2019   Glucose intolerance 04/12/2019   Chronic venous insufficiency 04/06/2019   Osteopenia 04/06/2019   Past Medical History:  Diagnosis Date   Anemia    per patient, had it years ago before hysterectomy years ago   Arthritis    Asthma    Bronchitis    Carpal tunnel syndrome of right wrist    Chronic venous insufficiency    GERD (gastroesophageal reflux disease)    History of hiatal hernia     Hypertension    Pneumonia    PONV (postoperative nausea and vomiting)     Family History  Problem Relation Age of Onset   Hypertension Mother    Alzheimer's disease Father     Past Surgical History:  Procedure Laterality Date   ABDOMINAL HYSTERECTOMY     CARPAL TUNNEL RELEASE Right 12/16/2019   Procedure: RIGHT CARPAL TUNNEL RELEASE;  Surgeon: Meredith Pel, MD;  Location: [facility];  Service: Orthopedics;  Laterality: Right;   COLONOSCOPY  06/05/2012   Procedure: COLONOSCOPY;  Surgeon: Rogene Houston, MD;  [facility];  Service: Endoscopy;  Laterality: N/A;  125-changed to 1225 Ann to notify pt   NISSEN FUNDOPLICATION     x2   SHOULDER ARTHROSCOPY WITH SUBACROMIAL DECOMPRESSION, ROTATOR CUFF REPAIR AND BICEP TENDON REPAIR Right 12/16/2019   Procedure: RIGHT SHOULDER ARTHROSCOPY, BICEPS TENODESIS, MINI OPEN ROTATOR CUFF TEAR REPAIR, DEBRIDEMENT;  Surgeon: Meredith Pel, MD;  Location:  [facility];  Service: Orthopedics;  Laterality: Right;   Social History   Occupational History   Not on file  Tobacco Use   Smoking status: Never   Smokeless tobacco: Never  Vaping Use   Vaping Use: Never used  Substance and Sexual Activity   Alcohol use: No   Drug use: No   Sexual activity: Not on file

## 2021-08-21 ENCOUNTER — Other Ambulatory Visit: Payer: Self-pay | Admitting: Nurse Practitioner

## 2021-08-21 DIAGNOSIS — J209 Acute bronchitis, unspecified: Secondary | ICD-10-CM

## 2021-08-21 DIAGNOSIS — J069 Acute upper respiratory infection, unspecified: Secondary | ICD-10-CM

## 2021-08-22 ENCOUNTER — Ambulatory Visit (INDEPENDENT_AMBULATORY_CARE_PROVIDER_SITE_OTHER): Payer: Medicare Other

## 2021-08-22 ENCOUNTER — Ambulatory Visit
Admission: EM | Admit: 2021-08-22 | Discharge: 2021-08-22 | Disposition: A | Discharge status: Home / Self Care | Payer: Medicare Other | Attending: Family Medicine | Admitting: Family Medicine

## 2021-08-22 ENCOUNTER — Other Ambulatory Visit: Payer: Self-pay

## 2021-08-22 ENCOUNTER — Encounter: Payer: Self-pay | Admitting: Emergency Medicine

## 2021-08-22 ENCOUNTER — Encounter: Payer: Self-pay | Admitting: Family Medicine

## 2021-08-22 DIAGNOSIS — J069 Acute upper respiratory infection, unspecified: Secondary | ICD-10-CM

## 2021-08-22 DIAGNOSIS — Z20828 Contact with and (suspected) exposure to other viral communicable diseases: Secondary | ICD-10-CM

## 2021-08-22 DIAGNOSIS — Z9189 Other specified personal risk factors, not elsewhere classified: Secondary | ICD-10-CM

## 2021-08-22 DIAGNOSIS — R059 Cough, unspecified: Secondary | ICD-10-CM | POA: Diagnosis not present

## 2021-08-22 DIAGNOSIS — R509 Fever, unspecified: Secondary | ICD-10-CM | POA: Diagnosis not present

## 2021-08-22 MED ORDER — OSELTAMIVIR PHOSPHATE 75 MG PO CAPS: 75.0000 mg | ORAL_CAPSULE | Freq: Two times a day (BID) | ORAL | 0 refills | Status: AC

## 2021-08-22 MED ORDER — BENZONATATE 100 MG PO CAPS: ORAL_CAPSULE | ORAL | 0 refills | Status: DC

## 2021-08-22 NOTE — ED Notes (Signed)
States son tested positive for flu yesterday.

## 2021-08-22 NOTE — ED Provider Notes (Signed)
[facility]   [id number] 08/22/21 Arrival Time: 0840  ASSESSMENT & PLAN:  1. Viral URI with cough   2. At increased risk of exposure to COVID-19 virus   3. Exposure to the flu    I have personally viewed the imaging studies ordered this visit. No acute changes. No signs of PNA.  Discussed typical duration of viral illnesses. Viral testing sent (COVID/flu). OTC symptom care as needed.  New Prescriptions   BENZONATATE (TESSALON) 100 MG CAPSULE    Take 1 capsule by mouth every 8 (eight) hours for cough.   OSELTAMIVIR (TAMIFLU) 75 MG CAPSULE    Take 1 capsule (75 mg total) by mouth 2 (two) times daily for 5 days.     Follow-up Information     Susy Frizzle, MD.   Specialty: Family Medicine Why: If worsening or failing to improve as anticipated. Contact information: [address] [place] [address] [phone]                 Reviewed expectations re: course of current medical issues. Questions answered. Outlined signs and symptoms indicating need for more acute intervention. Understanding verbalized. After Visit Summary given.   SUBJECTIVE: History from: Patient. EZABELLA TESKA is a 66 y.o. female. Reports: nasal congestion, cough, subj fever; abrupt onset, few d ago; cough now productive. No SOB. Normal PO intake without n/v/d.  OBJECTIVE:  Vitals:   08/22/21 0926  BP: (!) 175/84  Pulse: 91  Resp: 18  Temp: 100.2 F (37.9 C)  TempSrc: Oral  SpO2: 94%    General appearance: alert; no distress Eyes: PERRLA; EOMI; conjunctiva normal HENT: [place]; AT; with nasal congestion Neck: supple  Lungs: speaks full sentences without difficulty; unlabored; slightly coarse breath sounds throughout Extremities: no edema Skin: warm and dry Neurologic: normal gait Psychological: alert and cooperative; normal mood and affect  Labs:  Labs Reviewed  COVID-19, FLU A+B NAA    Imaging: DG Chest 2 View  Result Date: 08/22/2021 CLINICAL  DATA:  Cough, fever, ex-smoker. EXAM: CHEST - 2 VIEW COMPARISON:  07/12/2013 FINDINGS: Normal sized heart. Tortuous and mildly calcified thoracic aorta. Clear lungs. Moderate peribronchial thickening. Surgical clip at the gastroesophageal junction. Lower thoracic spine degenerative changes. IMPRESSION: Moderate bronchitic changes. Electronically Signed   By: Claudie Revering M.D.   On: 08/22/2021 10:11    Allergies  Allergen Reactions   Codeine Other (See Comments)    Severe headaches    Tetanus Antitoxin Swelling and Other (See Comments)    Fever    Tetanus Toxoids Swelling and Other (See Comments)    fever   Penicillins Rash    Past Medical History:  Diagnosis Date   Anemia    per patient, had it years ago before hysterectomy years ago   Arthritis    Asthma    Bronchitis    Carpal tunnel syndrome of right wrist    Chronic venous insufficiency    GERD (gastroesophageal reflux disease)    History of hiatal hernia    Hypertension    Pneumonia    PONV (postoperative nausea and vomiting)    Social History   Socioeconomic History   Marital status: Married    Spouse name: Not on file   Number of children: Not on file   Years of education: Not on file   Highest education level: Not on file  Occupational History   Not on file  Tobacco Use   Smoking status: Never   Smokeless tobacco:  Never  Vaping Use   Vaping Use: Never used  Substance and Sexual Activity   Alcohol use: No   Drug use: No   Sexual activity: Not on file  Other Topics Concern   Not on file  Social History Narrative   Not on file   Social Determinants of Health   Financial Resource Strain: Not on file  Food Insecurity: Not on file  Transportation Needs: Not on file  Physical Activity: Not on file  Stress: Not on file  Social Connections: Not on file  Intimate Partner Violence: Not on file   Family History  Problem Relation Age of Onset   Hypertension Mother    Alzheimer's disease Father    Past  Surgical History:  Procedure Laterality Date   ABDOMINAL HYSTERECTOMY     CARPAL TUNNEL RELEASE Right 12/16/2019   Procedure: RIGHT CARPAL TUNNEL RELEASE;  Surgeon: Meredith Pel, MD;  Location: [facility];  Service: Orthopedics;  Laterality: Right;   COLONOSCOPY  06/05/2012   Procedure: COLONOSCOPY;  Surgeon: Rogene Houston, MD;  [facility];  Service: Endoscopy;  Laterality: N/A;  125-changed to 1225 Ann to notify pt   NISSEN FUNDOPLICATION     x2   SHOULDER ARTHROSCOPY WITH SUBACROMIAL DECOMPRESSION, ROTATOR CUFF REPAIR AND BICEP TENDON REPAIR Right 12/16/2019   Procedure: RIGHT SHOULDER ARTHROSCOPY, BICEPS TENODESIS, MINI OPEN ROTATOR CUFF TEAR REPAIR, DEBRIDEMENT;  Surgeon: Meredith Pel, MD;  Location: [facility];  Service: Orthopedics;  Laterality: Right;     Vanessa Kick, MD 08/22/21 1044

## 2021-08-22 NOTE — ED Triage Notes (Signed)
Cough since Monday. Fever, headache, productive cough with yellow sputum.  Last dose of tylenol was at 7am.  Has been taking mucinex with some relief.

## 2021-08-22 NOTE — Discharge Instructions (Signed)
You have been tested for COVID-19 today. °If your test returns positive, you will receive a phone call from [facility] regarding your results. °Negative test results are not called. °Both positive and negative results area always visible on MyChart. °If you do not have a MyChart account, sign up instructions are provided in your discharge papers. °Please do not hesitate to contact us should you have questions or concerns. ° °

## 2021-08-23 LAB — COVID-19, FLU A+B NAA
Influenza A, NAA: DETECTED — AB
Influenza B, NAA: NOT DETECTED
SARS-CoV-2, NAA: NOT DETECTED

## 2021-08-23 NOTE — Telephone Encounter (Signed)
Erroneous encounter. Please disregard.

## 2021-08-27 ENCOUNTER — Ambulatory Visit (INDEPENDENT_AMBULATORY_CARE_PROVIDER_SITE_OTHER): Payer: Medicare Other

## 2021-08-27 ENCOUNTER — Ambulatory Visit
Admission: EM | Admit: 2021-08-27 | Discharge: 2021-08-27 | Disposition: A | Discharge status: Home / Self Care | Payer: Medicare Other | Attending: Urgent Care | Admitting: Urgent Care

## 2021-08-27 ENCOUNTER — Other Ambulatory Visit: Payer: Self-pay

## 2021-08-27 DIAGNOSIS — R059 Cough, unspecified: Secondary | ICD-10-CM

## 2021-08-27 DIAGNOSIS — R0981 Nasal congestion: Secondary | ICD-10-CM | POA: Diagnosis not present

## 2021-08-27 DIAGNOSIS — R0989 Other specified symptoms and signs involving the circulatory and respiratory systems: Secondary | ICD-10-CM | POA: Diagnosis not present

## 2021-08-27 DIAGNOSIS — J101 Influenza due to other identified influenza virus with other respiratory manifestations: Secondary | ICD-10-CM

## 2021-08-27 DIAGNOSIS — R062 Wheezing: Secondary | ICD-10-CM | POA: Diagnosis not present

## 2021-08-27 MED ORDER — PROMETHAZINE-DM 6.25-15 MG/5ML PO SYRP
5.0000 mL | ORAL_SOLUTION | Freq: Four times a day (QID) | ORAL | 0 refills | Status: DC | PRN
Start: 2021-08-27 — End: 2021-09-18

## 2021-08-27 MED ORDER — PREDNISONE 50 MG PO TABS: 50.0000 mg | ORAL_TABLET | Freq: Every day | ORAL | 0 refills | Status: DC

## 2021-08-27 NOTE — ED Provider Notes (Signed)
[facility]   MRN: [id number] DOB: 05-17-1956  Subjective:   Jessica [name] is a 66 y.o. female presenting for 1 week history of persistent intermittent left ear pain, coughing, wheezing, chest tightness.  She does have a history of asthma.  She is not a smoker.  She did test positive for influenza last week.  She also had a COVID test done and was negative.  Has a history of pneumonia and wants to make sure that so she has now.  No current facility-administered medications for this encounter.  Current Outpatient Medications:    benzonatate (TESSALON) 100 MG capsule, Take 1 capsule by mouth every 8 (eight) hours for cough., Disp: 21 capsule, Rfl: 0   cetirizine (ZYRTEC) 10 MG tablet, Take 10 mg by mouth daily as needed (allergies.). , Disp: , Rfl:    esomeprazole (NEXIUM) 20 MG capsule, Take 20 mg by mouth daily with supper., Disp: , Rfl:    furosemide (LASIX) 40 MG tablet, Take 1 tablet by mouth once daily, Disp: 90 tablet, Rfl: 0   guaiFENesin (MUCINEX) 600 MG 12 hr tablet, Take 1 tablet (600 mg total) by mouth 2 (two) times daily as needed for cough or to loosen phlegm., Disp: 30 tablet, Rfl: 0   naproxen sodium (ANAPROX) 220 MG tablet, Take 220-440 mg by mouth 3 (three) times daily as needed (pain. (Max 3 tablets/24 hrs.)). , Disp: , Rfl:    oseltamivir (TAMIFLU) 75 MG capsule, Take 1 capsule (75 mg total) by mouth 2 (two) times daily for 5 days., Disp: 10 capsule, Rfl: 0   ramipril (ALTACE) 5 MG capsule, Take 1 capsule by mouth once daily, Disp: 90 capsule, Rfl: 0   rosuvastatin (CRESTOR) 10 MG tablet, Take 1 tablet (10 mg total) by mouth daily., Disp: 90 tablet, Rfl: 3   VENTOLIN HFA 108 (90 Base) MCG/ACT inhaler, INHALE 2 PUFFS BY MOUTH EVERY 4 HOURS AS NEEDED FOR WHEEZING OR SHORTNESS OF BREATH, Disp: 18 g, Rfl: 0   Allergies  Allergen Reactions   Codeine Other (See Comments)    Severe headaches    Tetanus Antitoxin Swelling and Other (See Comments)    Fever     Tetanus Toxoids Swelling and Other (See Comments)    fever   Penicillins Rash    Past Medical History:  Diagnosis Date   Anemia    per patient, had it years ago before hysterectomy years ago   Arthritis    Asthma    Bronchitis    Carpal tunnel syndrome of right wrist    Chronic venous insufficiency    GERD (gastroesophageal reflux disease)    History of hiatal hernia    Hypertension    Pneumonia    PONV (postoperative nausea and vomiting)      Past Surgical History:  Procedure Laterality Date   ABDOMINAL HYSTERECTOMY     CARPAL TUNNEL RELEASE Right 12/16/2019   Procedure: RIGHT CARPAL TUNNEL RELEASE;  Surgeon: Meredith Pel, MD;  Location: [facility];  Service: Orthopedics;  Laterality: Right;   COLONOSCOPY  06/05/2012   Procedure: COLONOSCOPY;  Surgeon: Rogene Houston, MD;  [facility];  Service: Endoscopy;  Laterality: N/A;  125-changed to 1225 Ann to notify pt   NISSEN FUNDOPLICATION     x2   SHOULDER ARTHROSCOPY WITH SUBACROMIAL DECOMPRESSION, ROTATOR CUFF REPAIR AND BICEP TENDON REPAIR Right 12/16/2019   Procedure: RIGHT SHOULDER ARTHROSCOPY, BICEPS TENODESIS, MINI OPEN ROTATOR CUFF TEAR REPAIR, DEBRIDEMENT;  Surgeon: Meredith Pel, MD;  Location: [facility];  Service: Orthopedics;  Laterality: Right;    Family History  Problem Relation Age of Onset   Hypertension Mother    Alzheimer's disease Father     Social History   Tobacco Use   Smoking status: Never   Smokeless tobacco: Never  Vaping Use   Vaping Use: Never used  Substance Use Topics   Alcohol use: No   Drug use: No    ROS   Objective:   Vitals: BP (!) 150/84 (BP Location: Right Arm)    Pulse 73    Temp 98.1 F (36.7 C) (Oral)    Resp 20    SpO2 96%   Physical Exam Constitutional:      General: She is not in acute distress.    Appearance: Normal appearance. She is well-developed and normal weight. She is not ill-appearing, toxic-appearing or diaphoretic.  HENT:     Head:  Normocephalic and atraumatic.     Right Ear: Tympanic membrane, ear canal and external ear normal. No drainage or tenderness. No middle ear effusion. There is no impacted cerumen. Tympanic membrane is not erythematous.     Left Ear: Tympanic membrane, ear canal and external ear normal. No drainage or tenderness.  No middle ear effusion. There is no impacted cerumen. Tympanic membrane is not erythematous.     Nose: Congestion present. No rhinorrhea.     Mouth/Throat:     Mouth: Mucous membranes are moist. No oral lesions.     Pharynx: No pharyngeal swelling, oropharyngeal exudate, posterior oropharyngeal erythema or uvula swelling.     Tonsils: No tonsillar exudate or tonsillar abscesses.  Eyes:     General: No scleral icterus.       Right eye: No discharge.        Left eye: No discharge.     Extraocular Movements: Extraocular movements intact.     Right eye: Normal extraocular motion.     Left eye: Normal extraocular motion.     Conjunctiva/sclera: Conjunctivae normal.  Cardiovascular:     Rate and Rhythm: Normal rate.     Heart sounds: No murmur heard.   No friction rub. No gallop.  Pulmonary:     Effort: Pulmonary effort is normal. No respiratory distress.     Breath sounds: No stridor. No wheezing, rhonchi or rales.  Chest:     Chest wall: No tenderness.  Musculoskeletal:     Cervical back: Normal range of motion and neck supple.  Lymphadenopathy:     Cervical: No cervical adenopathy.  Skin:    General: Skin is warm and dry.  Neurological:     General: No focal deficit present.     Mental Status: She is alert and oriented to person, place, and time.  Psychiatric:        Mood and Affect: Mood normal.        Behavior: Behavior normal.    DG Chest 2 View  Result Date: 08/27/2021 CLINICAL DATA:  Cough and congestion for 1 week, LEFT side crackles, former smoker, hypertension EXAM: CHEST - 2 VIEW COMPARISON:  08/22/2021 FINDINGS: Upper normal heart size. Mediastinal contours  and pulmonary vascularity normal. Atherosclerotic calcification aorta. Lungs clear. No pulmonary infiltrate, pleural effusion, or pneumothorax. Scattered endplate spur formation thoracic spine. IMPRESSION: No acute abnormalities. Aortic Atherosclerosis (ICD10-I70.0). Electronically Signed   By: Lavonia Dana M.D.   On: 08/27/2021 08:57     Assessment and Plan :   PDMP not reviewed this encounter.  1. Influenza A  2. Rhonchi   3. Sinus congestion   4. Wheezing    Chest x-ray is reassuring.  We will hold off on antibiotic use.  Start a steroid course in the context of her history of asthma, bronchitis.  No signs of otitis media.  Recommend continued supportive care as she recovers through influenza.  Counseled patient on potential for adverse effects with medications prescribed/recommended today, ER and return-to-clinic precautions discussed, patient verbalized understanding.    Jaynee Eagles, [place] 08/27/21 [phone]

## 2021-08-27 NOTE — ED Triage Notes (Signed)
Pt reports cough, wheezing  and left ear pain x 1 week. Pt reports she tested positive for Flu on 08/21/2021.

## 2021-08-28 ENCOUNTER — Encounter (HOSPITAL_COMMUNITY): Payer: Medicare Other

## 2021-09-07 ENCOUNTER — Other Ambulatory Visit: Payer: Self-pay | Admitting: Family Medicine

## 2021-09-10 ENCOUNTER — Other Ambulatory Visit: Payer: Self-pay

## 2021-09-17 ENCOUNTER — Encounter: Payer: Self-pay | Admitting: Family Medicine

## 2021-09-18 ENCOUNTER — Telehealth: Payer: Medicare Other | Admitting: Physician Assistant

## 2021-09-18 DIAGNOSIS — B9689 Other specified bacterial agents as the cause of diseases classified elsewhere: Secondary | ICD-10-CM

## 2021-09-18 DIAGNOSIS — J208 Acute bronchitis due to other specified organisms: Secondary | ICD-10-CM | POA: Diagnosis not present

## 2021-09-18 MED ORDER — DOXYCYCLINE HYCLATE 100 MG PO TABS: 100.0000 mg | ORAL_TABLET | Freq: Two times a day (BID) | ORAL | 0 refills | Status: DC

## 2021-09-18 MED ORDER — PROMETHAZINE-DM 6.25-15 MG/5ML PO SYRP: 5.0000 mL | ORAL_SOLUTION | Freq: Four times a day (QID) | ORAL | 0 refills | Status: DC | PRN

## 2021-09-18 MED ORDER — BENZONATATE 100 MG PO CAPS: 100.0000 mg | ORAL_CAPSULE | Freq: Three times a day (TID) | ORAL | 0 refills | Status: DC | PRN

## 2021-09-18 NOTE — Addendum Note (Signed)
Addended by: Mar Daring on: 09/18/2021 06:22 PM ? ? Modules accepted: Level of Service ? ?

## 2021-09-18 NOTE — Progress Notes (Signed)
?Virtual Visit Consent  ? ?[place], you are scheduled for a virtual visit with a [facility] provider today.   ?  ?Just as with appointments in the office, your consent must be obtained to participate.  Your consent will be active for this visit and any virtual visit you may have with one of our providers in the next 365 days.   ?  ?If you have a MyChart account, a copy of this consent can be sent to you electronically.  All virtual visits are billed to your insurance company just like a traditional visit in the office.   ? ?As this is a virtual visit, video technology does not allow for your provider to perform a traditional examination.  This may limit your provider's ability to fully assess your condition.  If your provider identifies any concerns that need to be evaluated in person or the need to arrange testing (such as labs, EKG, etc.), we will make arrangements to do so.   ?  ?Although advances in technology are sophisticated, we cannot ensure that it will always work on either your end or our end.  If the connection with a video visit is poor, the visit may have to be switched to a telephone visit.  With either a video or telephone visit, we are not always able to ensure that we have a secure connection.    ? ?I need to obtain your verbal consent now.   Are you willing to proceed with your visit today?  ?  ?Jessica [name] has provided verbal consent on 09/18/2021 for a virtual visit (video or telephone). ?  ?Mar Daring, PA-C  ? ?Date: 09/18/2021 5:25 PM ? ? ?Virtual Visit via Video Note  ? ?Jessica [name], connected with  Jessica [name]  ([id number], 1955/09/19) on 09/18/21 at  5:15 PM EST by a video-enabled telemedicine application and verified that I am speaking with the correct person using two identifiers. ? ?Location: ?Patient: Virtual Visit Location Patient: Home ?Provider: Virtual Visit Location Provider: Home Office ?  ?I discussed the limitations of evaluation and management by  telemedicine and the availability of in person appointments. The patient expressed understanding and agreed to proceed.   ? ?History of Present Illness: ?Jessica [name] is a 66 y.o. who identifies as a female who was assigned female at birth, and is being seen today for cough/URI symptoms continuing from recent flu A diagnosis on 08/22/21. Treated with tessalon perles and Tamiflu. Then she was seen again on 08/27/21 and was started on Prednisone '50mg'$  x 5 days and Promethazine DM ? ?HPI: URI  ?This is a new problem. The current episode started 1 to 4 weeks ago. The problem has been gradually worsening. There has been no fever. Associated symptoms include congestion, coughing, headaches, rhinorrhea, sinus pain and a sore throat. Pertinent negatives include no ear pain or plugged ear sensation. Treatments tried: see above. The treatment provided no relief.   ? ? ?Problems:  ?Patient Active Problem List  ? Diagnosis Date Noted  ? Upper respiratory tract infection 07/18/2020  ? Hypertension 04/12/2019  ? Glucose intolerance 04/12/2019  ? Chronic venous insufficiency 04/06/2019  ? Osteopenia 04/06/2019  ?  ?Allergies:  ?Allergies  ?Allergen Reactions  ? Codeine Other (See Comments)  ?  Severe headaches ?  ? Tetanus Antitoxin Swelling and Other (See Comments)  ?  Fever ?  ? Tetanus Toxoids Swelling and Other (See Comments)  ?  fever  ? Penicillins  Rash  ? ?Medications:  ?Current Outpatient Medications:  ?  benzonatate (TESSALON) 100 MG capsule, Take 1 capsule (100 mg total) by mouth 3 (three) times daily as needed., Disp: 30 capsule, Rfl: 0 ?  doxycycline (VIBRA-TABS) 100 MG tablet, Take 1 tablet (100 mg total) by mouth 2 (two) times daily., Disp: 20 tablet, Rfl: 0 ?  promethazine-dextromethorphan (PROMETHAZINE-DM) 6.25-15 MG/5ML syrup, Take 5 mLs by mouth 4 (four) times daily as needed., Disp: 118 mL, Rfl: 0 ?  acetaminophen (TYLENOL 8 HOUR ARTHRITIS PAIN) 650 MG CR tablet, , Disp: , Rfl:  ?  cetirizine (ZYRTEC) 10 MG  tablet, Take 10 mg by mouth daily as needed (allergies.). , Disp: , Rfl:  ?  esomeprazole (NEXIUM) 20 MG capsule, Take 20 mg by mouth daily with supper., Disp: , Rfl:  ?  furosemide (LASIX) 40 MG tablet, Take 1 tablet by mouth once daily, Disp: 90 tablet, Rfl: 0 ?  guaiFENesin (MUCINEX) 600 MG 12 hr tablet, Take 1 tablet (600 mg total) by mouth 2 (two) times daily as needed for cough or to loosen phlegm., Disp: 30 tablet, Rfl: 0 ?  naproxen sodium (ANAPROX) 220 MG tablet, Take 220-440 mg by mouth 3 (three) times daily as needed (pain. (Max 3 tablets/24 hrs.)). , Disp: , Rfl:  ?  predniSONE (DELTASONE) 50 MG tablet, Take 1 tablet (50 mg total) by mouth daily with breakfast., Disp: 5 tablet, Rfl: 0 ?  ramipril (ALTACE) 5 MG capsule, Take 1 capsule by mouth once daily, Disp: 90 capsule, Rfl: 3 ?  rosuvastatin (CRESTOR) 10 MG tablet, Take 1 tablet (10 mg total) by mouth daily., Disp: 90 tablet, Rfl: 3 ?  VENTOLIN HFA 108 (90 Base) MCG/ACT inhaler, INHALE 2 PUFFS BY MOUTH EVERY 4 HOURS AS NEEDED FOR WHEEZING OR SHORTNESS OF BREATH, Disp: 18 g, Rfl: 0 ? ?Observations/Objective: ?Patient is well-developed, well-nourished in no acute distress.  ?Resting comfortably at home.  ?Head is normocephalic, atraumatic.  ?No labored breathing.  ?Speech is clear and coherent with logical content.  ?Patient is alert and oriented at baseline.  ? ? ?Assessment and Plan: ?1. Acute bacterial bronchitis ?- promethazine-dextromethorphan (PROMETHAZINE-DM) 6.25-15 MG/5ML syrup; Take 5 mLs by mouth 4 (four) times daily as needed.  Dispense: 118 mL; Refill: 0 ?- doxycycline (VIBRA-TABS) 100 MG tablet; Take 1 tablet (100 mg total) by mouth 2 (two) times daily.  Dispense: 20 tablet; Refill: 0 ?- benzonatate (TESSALON) 100 MG capsule; Take 1 capsule (100 mg total) by mouth 3 (three) times daily as needed.  Dispense: 30 capsule; Refill: 0 ? ?- Suspect secondary bacterial infection following Influenza ?- Will treat with Doxycycline ?- Tessalon perles  and Promethazine DM refilled ?- Steam treatments and humidifier can help ?- Push fluids ?- Rest as needed ?- Seek in person evaluation if not improving or if worsening ? ?Follow Up Instructions: ?I discussed the assessment and treatment plan with the patient. The patient was provided an opportunity to ask questions and all were answered. The patient agreed with the plan and demonstrated an understanding of the instructions.  A copy of instructions were sent to the patient via MyChart unless otherwise noted below.  ? ?The patient was advised to call back or seek an in-person evaluation if the symptoms worsen or if the condition fails to improve as anticipated. ? ?Time:  ?I spent 15 minutes with the patient via telehealth technology discussing the above problems/concerns.   ? ?Mar Daring, PA-C ?

## 2021-09-18 NOTE — Patient Instructions (Signed)
Jessica [name], thank you for joining Mar Daring, PA-C for today's virtual visit.  While this provider is not your primary care provider (PCP), if your PCP is located in our provider database this encounter information will be shared with them immediately following your visit.  Consent: (Patient) Jessica [name] provided verbal consent for this virtual visit at the beginning of the encounter.  Current Medications:  Current Outpatient Medications:    benzonatate (TESSALON) 100 MG capsule, Take 1 capsule (100 mg total) by mouth 3 (three) times daily as needed., Disp: 30 capsule, Rfl: 0   doxycycline (VIBRA-TABS) 100 MG tablet, Take 1 tablet (100 mg total) by mouth 2 (two) times daily., Disp: 20 tablet, Rfl: 0   promethazine-dextromethorphan (PROMETHAZINE-DM) 6.25-15 MG/5ML syrup, Take 5 mLs by mouth 4 (four) times daily as needed., Disp: 118 mL, Rfl: 0   acetaminophen (TYLENOL 8 HOUR ARTHRITIS PAIN) 650 MG CR tablet, , Disp: , Rfl:    cetirizine (ZYRTEC) 10 MG tablet, Take 10 mg by mouth daily as needed (allergies.). , Disp: , Rfl:    esomeprazole (NEXIUM) 20 MG capsule, Take 20 mg by mouth daily with supper., Disp: , Rfl:    furosemide (LASIX) 40 MG tablet, Take 1 tablet by mouth once daily, Disp: 90 tablet, Rfl: 0   guaiFENesin (MUCINEX) 600 MG 12 hr tablet, Take 1 tablet (600 mg total) by mouth 2 (two) times daily as needed for cough or to loosen phlegm., Disp: 30 tablet, Rfl: 0   naproxen sodium (ANAPROX) 220 MG tablet, Take 220-440 mg by mouth 3 (three) times daily as needed (pain. (Max 3 tablets/24 hrs.)). , Disp: , Rfl:    predniSONE (DELTASONE) 50 MG tablet, Take 1 tablet (50 mg total) by mouth daily with breakfast., Disp: 5 tablet, Rfl: 0   ramipril (ALTACE) 5 MG capsule, Take 1 capsule by mouth once daily, Disp: 90 capsule, Rfl: 3   rosuvastatin (CRESTOR) 10 MG tablet, Take 1 tablet (10 mg total) by mouth daily., Disp: 90 tablet, Rfl: 3   VENTOLIN HFA 108 (90 Base) MCG/ACT  inhaler, INHALE 2 PUFFS BY MOUTH EVERY 4 HOURS AS NEEDED FOR WHEEZING OR SHORTNESS OF BREATH, Disp: 18 g, Rfl: 0   Medications ordered in this encounter:  Meds ordered this encounter  Medications   promethazine-dextromethorphan (PROMETHAZINE-DM) 6.25-15 MG/5ML syrup    Sig: Take 5 mLs by mouth 4 (four) times daily as needed.    Dispense:  118 mL    Refill:  0    Order Specific Question:   Supervising Provider    Answer:   MILLER, BRIAN [[id number]]   doxycycline (VIBRA-TABS) 100 MG tablet    Sig: Take 1 tablet (100 mg total) by mouth 2 (two) times daily.    Dispense:  20 tablet    Refill:  0    Order Specific Question:   Supervising Provider    Answer:   MILLER, BRIAN [[id number]]   benzonatate (TESSALON) 100 MG capsule    Sig: Take 1 capsule (100 mg total) by mouth 3 (three) times daily as needed.    Dispense:  30 capsule    Refill:  0    Order Specific Question:   Supervising Provider    Answer:   Sabra Heck, [place]     *If you need refills on other medications prior to your next appointment, please contact your pharmacy*  Follow-Up: Call back or seek an in-person evaluation if the symptoms worsen or if the condition fails to improve  as anticipated.  Other Instructions Acute Bronchitis, Adult Acute bronchitis is sudden inflammation of the main airways (bronchi) that come off the windpipe (trachea) in the lungs. The swelling causes the airways to get smaller and make more mucus than normal. This can make it hard to breathe and can cause coughing or noisy breathing (wheezing). Acute bronchitis may last several weeks. The cough may last longer. Allergies, asthma, and exposure to smoke may make the condition worse. What are the causes? This condition can be caused by germs and by substances that irritate the lungs, including: Cold and flu viruses. The most common cause of this condition is the virus that causes the common cold. Bacteria. This is less common. Breathing in substances that  irritate the lungs, including: Smoke from cigarettes and other forms of tobacco. Dust and pollen. Fumes from household cleaning products, gases, or burned fuel. Indoor or outdoor air pollution. What increases the risk? The following factors may make you more likely to develop this condition: A weak body's defense system, also called the immune system. A condition that affects your lungs and breathing, such as asthma. What are the signs or symptoms? Common symptoms of this condition include: Coughing. This may bring up clear, yellow, or green mucus from your lungs (sputum). Wheezing. Runny or stuffy nose. Having too much mucus in your lungs (chest congestion). Shortness of breath. Aches and pains, including sore throat or chest. How is this diagnosed? This condition is usually diagnosed based on: Your symptoms and medical history. A physical exam. You may also have other tests, including tests to rule out other conditions, such as pneumonia. These tests include: A test of lung function. Test of a mucus sample to look for the presence of bacteria. Tests to check the oxygen level in your blood. Blood tests. Chest X-ray. How is this treated? Most cases of acute bronchitis clear up over time without treatment. Your health care provider may recommend: Drinking more fluids to help thin your mucus so it is easier to cough up. Taking inhaled medicine (inhaler) to improve air flow in and out of your lungs. Using a vaporizer or a humidifier. These are machines that add water to the air to help you breathe better. Taking a medicine that thins mucus and clears congestion (expectorant). Taking a medicine that prevents or stops coughing (cough suppressant). It is notcommon to take an antibiotic medicine for this condition. Follow these instructions at home:  Take over-the-counter and prescription medicines only as told by your health care provider. Use an inhaler, vaporizer, or humidifier as  told by your health care provider. Take two teaspoons (10 mL) of honey at bedtime to lessen coughing at night. Drink enough fluid to keep your urine pale yellow. Do not use any products that contain nicotine or tobacco. These products include cigarettes, chewing tobacco, and vaping devices, such as e-cigarettes. If you need help quitting, ask your health care provider. Get plenty of rest. Return to your normal activities as told by your health care provider. Ask your health care provider what activities are safe for you. Keep all follow-up visits. This is important. How is this prevented? To lower your risk of getting this condition again: Wash your hands often with soap and water for at least 20 seconds. If soap and water are not available, use hand sanitizer. Avoid contact with people who have cold symptoms. Try not to touch your mouth, nose, or eyes with your hands. Avoid breathing in smoke or chemical fumes. Breathing smoke or  chemical fumes will make your condition worse. Get the flu shot every year. Contact a health care provider if: Your symptoms do not improve after 2 weeks. You have trouble coughing up the mucus. Your cough keeps you awake at night. You have a fever. Get help right away if you: Cough up blood. Feel pain in your chest. Have severe shortness of breath. Faint or keep feeling like you are going to faint. Have a severe headache. Have a fever or chills that get worse. These symptoms may represent a serious problem that is an emergency. Do not wait to see if the symptoms will go away. Get medical help right away. Call your local emergency services (911 in the [place] Do not drive yourself to the hospital. Summary Acute bronchitis is inflammation of the main airways (bronchi) that come off the windpipe (trachea) in the lungs. The swelling causes the airways to get smaller and make more mucus than normal. Drinking more fluids can help thin your mucus so it is easier to  cough up. Take over-the-counter and prescription medicines only as told by your health care provider. Do not use any products that contain nicotine or tobacco. These products include cigarettes, chewing tobacco, and vaping devices, such as e-cigarettes. If you need help quitting, ask your health care provider. Contact a health care provider if your symptoms do not improve after 2 weeks. This information is not intended to replace advice given to you by your health care provider. Make sure you discuss any questions you have with your health care provider. Document Revised: 11/01/2020 Document Reviewed: 11/01/2020 Elsevier Patient Education  2022 [employer].    If you have been instructed to have an in-person evaluation today at a local Urgent Care facility, please use the link below. It will take you to a list of all of our available [facility], including address, phone number and hours of operation. Please do not delay care.  [facility]  If you or a family member do not have a primary care provider, use the link below to schedule a visit and establish care. When you choose a [facility] primary care physician or advanced practice provider, you gain a long-term partner in health. Find a Primary Care Provider  Learn more about [facility] in-office and virtual care options: [facility]

## 2021-10-03 ENCOUNTER — Telehealth: Payer: Self-pay

## 2021-10-03 NOTE — Telephone Encounter (Signed)
BV pending for SynviscOne, bilateral knee. ? ?

## 2021-10-05 ENCOUNTER — Telehealth: Payer: Self-pay

## 2021-10-05 NOTE — Telephone Encounter (Signed)
Approved for SynviscOne, bilateral knee. ?[place] ?Medicare deductible has been met ?Covered at 100% through [facility] after Medicare pays. ?No Co-pay ?No PA required ? ?Appt. 10/24/2021 with Dr. Marlou Sa ?

## 2021-10-24 ENCOUNTER — Encounter: Payer: Self-pay | Admitting: Orthopedic Surgery

## 2021-10-24 ENCOUNTER — Ambulatory Visit (INDEPENDENT_AMBULATORY_CARE_PROVIDER_SITE_OTHER): Payer: Medicare Other | Admitting: Orthopedic Surgery

## 2021-10-24 DIAGNOSIS — M1712 Unilateral primary osteoarthritis, left knee: Secondary | ICD-10-CM

## 2021-10-24 DIAGNOSIS — M170 Bilateral primary osteoarthritis of knee: Secondary | ICD-10-CM

## 2021-10-24 DIAGNOSIS — M1711 Unilateral primary osteoarthritis, right knee: Secondary | ICD-10-CM

## 2021-10-24 MED ORDER — HYLAN G-F 20 48 MG/6ML IX SOSY
48.0000 mg | PREFILLED_SYRINGE | INTRA_ARTICULAR | Status: AC | PRN
  Administered 2021-10-24: 48 mg via INTRA_ARTICULAR

## 2021-10-24 MED ORDER — LIDOCAINE HCL 1 % IJ SOLN
5.0000 mL | INTRAMUSCULAR | Status: AC | PRN
  Administered 2021-10-24: 5 mL

## 2021-10-24 MED ORDER — METHYLPREDNISOLONE ACETATE 40 MG/ML IJ SUSP
40.0000 mg | INTRAMUSCULAR | Status: AC | PRN
  Administered 2021-10-24: 40 mg via INTRA_ARTICULAR

## 2021-10-24 MED ORDER — BUPIVACAINE HCL 0.25 % IJ SOLN
4.0000 mL | INTRAMUSCULAR | Status: AC | PRN
  Administered 2021-10-24: 4 mL via INTRA_ARTICULAR

## 2021-10-24 NOTE — Progress Notes (Signed)
? ?  Procedure Note ? ?Patient: Jessica [name]             ?Date of Birth: 03-19-1956           ?MRN: [id number]             ?Visit Date: 10/24/2021 ? ?Procedures: ?Visit Diagnoses:  ?1. Bilateral primary osteoarthritis of knee   ? ? ?Large Joint Inj: R knee on 10/24/2021 1:58 PM ?Indications: pain, joint swelling and diagnostic evaluation ?Details: 18 G 1.5 in needle, superolateral approach ? ?Arthrogram: No ? ?Medications: 5 mL lidocaine 1 %; 48 mg Hylan 48 MG/6ML ?Outcome: tolerated well, no immediate complications ?Procedure, treatment alternatives, risks and benefits explained, specific risks discussed. Consent was given by the patient. Immediately prior to procedure a time out was called to verify the correct patient, procedure, equipment, support staff and site/side marked as required. Patient was prepped and draped in the usual sterile fashion.  ? ? ?Large Joint Inj: L knee on 10/24/2021 1:58 PM ?Indications: diagnostic evaluation, joint swelling and pain ?Details: 18 G 1.5 in needle, superolateral approach ? ?Arthrogram: No ? ?Medications: 5 mL lidocaine 1 %; 40 mg methylPREDNISolone acetate 40 MG/ML; 4 mL bupivacaine 0.25 % ?Outcome: tolerated well, no immediate complications ?Procedure, treatment alternatives, risks and benefits explained, specific risks discussed. Consent was given by the patient. Immediately prior to procedure a time out was called to verify the correct patient, procedure, equipment, support staff and site/side marked as required. Patient was prepped and draped in the usual sterile fashion.  ? ? ? ? ? ?

## 2021-12-13 ENCOUNTER — Encounter: Payer: Self-pay | Admitting: Emergency Medicine

## 2021-12-13 ENCOUNTER — Ambulatory Visit
Admission: EM | Admit: 2021-12-13 | Discharge: 2021-12-13 | Disposition: A | Discharge status: Home / Self Care | Payer: Medicare Other | Attending: Nurse Practitioner | Admitting: Nurse Practitioner

## 2021-12-13 DIAGNOSIS — M792 Neuralgia and neuritis, unspecified: Secondary | ICD-10-CM

## 2021-12-13 MED ORDER — VALACYCLOVIR HCL 1 G PO TABS: 1000.0000 mg | ORAL_TABLET | Freq: Three times a day (TID) | ORAL | 0 refills | Status: AC

## 2021-12-13 NOTE — ED Triage Notes (Signed)
Tingling pain around left area on back.  States it feels like shattered glass.  Pain since yesterday.  Area hurts when touched or rubbed.

## 2021-12-13 NOTE — Discharge Instructions (Signed)
Take medication if you develop a rash. Do not rubs, scratch, or irritate the area. If blistering does occur, keep the area covered as this is when you are contagious. May take over-the-counter Benadryl as needed as necessary for itching. Follow-up as needed.

## 2021-12-13 NOTE — ED Provider Notes (Signed)
[facility]    CSN: [id number] Arrival date & time: 12/13/21  1749      History   Chief Complaint No chief complaint on file.   HPI Jessica [name] is a 66 y.o. female.   HPI Patient presents for complaints of possible shingles.  States that 1 day ago she has since developed a tingling pain on the left area of her back.  She states worsen with any shearing against her close or touch.  She states the pain did improve, but today, when she tried to pick up her grandson, she noticed the pain again.  She states that she has had shingles in the past and the symptoms feel very similar.  She states that the area does become irritated when it is touched or rubbed.  She denies fever, chills, blistering, redness, erythema.  Patient states she has not received her shingles vaccine.  Past Medical History:  Diagnosis Date   Anemia    per patient, had it years ago before hysterectomy years ago   Arthritis    Asthma    Bronchitis    Carpal tunnel syndrome of right wrist    Chronic venous insufficiency    GERD (gastroesophageal reflux disease)    History of hiatal hernia    Hypertension    Pneumonia    PONV (postoperative nausea and vomiting)     Patient Active Problem List   Diagnosis Date Noted   Upper respiratory tract infection 07/18/2020   Hypertension 04/12/2019   Glucose intolerance 04/12/2019   Chronic venous insufficiency 04/06/2019   Osteopenia 04/06/2019    Past Surgical History:  Procedure Laterality Date   ABDOMINAL HYSTERECTOMY     CARPAL TUNNEL RELEASE Right 12/16/2019   Procedure: RIGHT CARPAL TUNNEL RELEASE;  Surgeon: Meredith Pel, MD;  Location: [place];  Service: Orthopedics;  Laterality: Right;   COLONOSCOPY  06/05/2012   Procedure: COLONOSCOPY;  Surgeon: Rogene Houston, MD;  [facility];  Service: Endoscopy;  Laterality: N/A;  125-changed to 1225 Ann to notify pt   NISSEN FUNDOPLICATION     x2   SHOULDER ARTHROSCOPY WITH SUBACROMIAL  DECOMPRESSION, ROTATOR CUFF REPAIR AND BICEP TENDON REPAIR Right 12/16/2019   Procedure: RIGHT SHOULDER ARTHROSCOPY, BICEPS TENODESIS, MINI OPEN ROTATOR CUFF TEAR REPAIR, DEBRIDEMENT;  Surgeon: Meredith Pel, MD;  Location: [place];  Service: Orthopedics;  Laterality: Right;    OB History   No obstetric history on file.      Home Medications    Prior to Admission medications   Medication Sig Start Date End Date Taking? Authorizing Provider  valACYclovir (VALTREX) 1000 MG tablet Take 1 tablet (1,000 mg total) by mouth 3 (three) times daily for 7 days. 12/13/21 12/20/21 Yes Itxel Wickard-Warren, Alda Lea, NP  acetaminophen (TYLENOL 8 HOUR ARTHRITIS PAIN) 650 MG CR tablet  10/27/20   [provider]  benzonatate (TESSALON) 100 MG capsule Take 1 capsule (100 mg total) by mouth 3 (three) times daily as needed. 09/18/21   Mar Daring, PA-C  cetirizine (ZYRTEC) 10 MG tablet Take 10 mg by mouth daily as needed (allergies.).     [provider]  doxycycline (VIBRA-TABS) 100 MG tablet Take 1 tablet (100 mg total) by mouth 2 (two) times daily. 09/18/21   Mar Daring, PA-C  esomeprazole (NEXIUM) 20 MG capsule Take 20 mg by mouth daily with supper.    [provider]  furosemide (LASIX) 40 MG tablet Take 1 tablet by mouth once daily  10/10/20   Susy Frizzle, MD  guaiFENesin (MUCINEX) 600 MG 12 hr tablet Take 1 tablet (600 mg total) by mouth 2 (two) times daily as needed for cough or to loosen phlegm. 07/18/20   Eulogio Bear, NP  naproxen sodium (ANAPROX) 220 MG tablet Take 220-440 mg by mouth 3 (three) times daily as needed (pain. (Max 3 tablets/24 hrs.)).     [provider]  predniSONE (DELTASONE) 50 MG tablet Take 1 tablet (50 mg total) by mouth daily with breakfast. 08/27/21   Jaynee Eagles, PA-C  promethazine-dextromethorphan (PROMETHAZINE-DM) 6.25-15 MG/5ML syrup Take 5 mLs by mouth 4 (four) times daily as needed. 09/18/21   Mar Daring, PA-C   ramipril (ALTACE) 5 MG capsule Take 1 capsule by mouth once daily 09/10/21   Susy Frizzle, MD  rosuvastatin (CRESTOR) 10 MG tablet Take 1 tablet (10 mg total) by mouth daily. 07/20/21   Susy Frizzle, MD  VENTOLIN HFA 108 (90 Base) MCG/ACT inhaler INHALE 2 PUFFS BY MOUTH EVERY 4 HOURS AS NEEDED FOR WHEEZING OR SHORTNESS OF BREATH 08/21/21   Susy Frizzle, MD    Family History Family History  Problem Relation Age of Onset   Hypertension Mother    Alzheimer's disease Father     Social History Social History   Tobacco Use   Smoking status: Never   Smokeless tobacco: Never  Vaping Use   Vaping Use: Never used  Substance Use Topics   Alcohol use: No   Drug use: No     Allergies   Codeine, Tetanus antitoxin, Tetanus toxoids, and Penicillins   Review of Systems Review of Systems Per HPI  Physical Exam Triage Vital Signs ED Triage Vitals [12/13/21 1841]  Enc Vitals Group     BP (!) 161/85     Pulse Rate 90     Resp 18     Temp 98.8 F (37.1 C)     Temp Source Oral     SpO2 97 %     Weight      Height      Head Circumference      Peak Flow      Pain Score 7     Pain Loc      Pain Edu?      Excl. in [place]?    No data found.  Updated Vital Signs BP (!) 161/85 (BP Location: Right Arm)   Pulse 90   Temp 98.8 F (37.1 C) (Oral)   Resp 18   SpO2 97%   Visual Acuity Right Eye Distance:   Left Eye Distance:   Bilateral Distance:    Right Eye Near:   Left Eye Near:    Bilateral Near:     Physical Exam Vitals and nursing note reviewed.  Constitutional:      Appearance: Normal appearance.  HENT:     Head: Normocephalic.  Cardiovascular:     Rate and Rhythm: Normal rate and regular rhythm.     Pulses: Normal pulses.     Heart sounds: Normal heart sounds.  Abdominal:     General: Bowel sounds are normal.     Palpations: Abdomen is soft.  Skin:    General: Skin is warm and dry.     Findings: No erythema or rash.     Comments: Tenderness to  thoracic dermatome T7-T8.  No redness, blistering, or rash noted.  Neurological:     General: No focal deficit present.     Mental Status: She  is alert and oriented to person, place, and time.  Psychiatric:        Mood and Affect: Mood normal.        Behavior: Behavior normal.     UC Treatments / Results  Labs (all labs ordered are listed, but only abnormal results are displayed) Labs Reviewed - No data to display  EKG   Radiology No results found.  Procedures Procedures (including critical care time)  Medications Ordered in UC Medications - No data to display  Initial Impression / Assessment and Plan / UC Course  I have reviewed the triage vital signs and the nursing notes.  Pertinent labs & imaging results that were available during my care of the patient were reviewed by me and considered in my medical decision making (see chart for details).  Patient presents for what she feels may be the beginning stages of shingles.  She has tenderness to dermatomes T7-T8.  On exam, no rash is visibly present at this time.  Patient does not have sensitivity to the area.  Discussion with patient regarding how shingles would appear.  Patient is requesting medication to take if the rash does develop.  Patient was advised to take the medication at the first indication of the rash.  Supportive care recommendations were provided.  Follow-up as needed. Final Clinical Impressions(s) / UC Diagnoses   Final diagnoses:  Nerve pain     Discharge Instructions      Take medication if you develop a rash. Do not rubs, scratch, or irritate the area. If blistering does occur, keep the area covered as this is when you are contagious. May take over-the-counter Benadryl as needed as necessary for itching. Follow-up as needed.     ED Prescriptions     Medication Sig Dispense Auth. Provider   valACYclovir (VALTREX) 1000 MG tablet Take 1 tablet (1,000 mg total) by mouth 3 (three) times daily for  7 days. 21 tablet Sawsan Riggio-Warren, Alda Lea, NP      PDMP not reviewed this encounter.   Tish Men, NP 12/13/21 1920

## 2021-12-20 ENCOUNTER — Encounter: Payer: Self-pay | Admitting: Family Medicine

## 2021-12-24 ENCOUNTER — Ambulatory Visit (INDEPENDENT_AMBULATORY_CARE_PROVIDER_SITE_OTHER): Payer: Medicare Other | Admitting: Family Medicine

## 2021-12-24 VITALS — BP 148/86 | HR 77 | Temp 97.9°F | Ht 63.0 in | Wt 293.6 lb

## 2021-12-24 DIAGNOSIS — B0229 Other postherpetic nervous system involvement: Secondary | ICD-10-CM | POA: Diagnosis not present

## 2021-12-24 MED ORDER — GABAPENTIN 300 MG PO CAPS: 300.0000 mg | ORAL_CAPSULE | Freq: Three times a day (TID) | ORAL | 3 refills | Status: AC

## 2021-12-24 NOTE — Telephone Encounter (Signed)
Spoke with pt made her an appt for today at 12pm a had  cancellation.

## 2021-12-24 NOTE — Progress Notes (Signed)
Subjective:    Patient ID: Jessica [name], female    DOB: 05/10/1956, 66 y.o.   MRN: [id number]  HPI  Patient presents today for "shingles".  She was seen last week in urgent care and was given Valtrex for shingles.  At that time, she was having severe nerve pain radiating from the center of her back around her left ribs to her left mid abdomen.  It is a burning stinging superficial pain.  She states that she has had shingles in that exact same area.  She states that this is the fourth time she has had shingles in that area.  However the last few times there has been no visible rash.  However she states that there is always sensitivity in that area.  Specifically she states that it hurts to sit against a chair or to lean against that side while driving her car. Past Medical History:  Diagnosis Date   Anemia    per patient, had it years ago before hysterectomy years ago   Arthritis    Asthma    Bronchitis    Carpal tunnel syndrome of right wrist    Chronic venous insufficiency    GERD (gastroesophageal reflux disease)    History of hiatal hernia    Hypertension    Pneumonia    PONV (postoperative nausea and vomiting)    Past Surgical History:  Procedure Laterality Date   ABDOMINAL HYSTERECTOMY     CARPAL TUNNEL RELEASE Right 12/16/2019   Procedure: RIGHT CARPAL TUNNEL RELEASE;  Surgeon: Meredith Pel, MD;  Location: [place];  Service: Orthopedics;  Laterality: Right;   COLONOSCOPY  06/05/2012   Procedure: COLONOSCOPY;  Surgeon: Rogene Houston, MD;  [facility];  Service: Endoscopy;  Laterality: N/A;  125-changed to 1225 Ann to notify pt   NISSEN FUNDOPLICATION     x2   SHOULDER ARTHROSCOPY WITH SUBACROMIAL DECOMPRESSION, ROTATOR CUFF REPAIR AND BICEP TENDON REPAIR Right 12/16/2019   Procedure: RIGHT SHOULDER ARTHROSCOPY, BICEPS TENODESIS, MINI OPEN ROTATOR CUFF TEAR REPAIR, DEBRIDEMENT;  Surgeon: Meredith Pel, MD;  Location: [place];  Service: Orthopedics;   Laterality: Right;   Current Outpatient Medications on File Prior to Visit  Medication Sig Dispense Refill   acetaminophen (TYLENOL 8 HOUR ARTHRITIS PAIN) 650 MG CR tablet      cetirizine (ZYRTEC) 10 MG tablet Take 10 mg by mouth daily as needed (allergies.).      esomeprazole (NEXIUM) 20 MG capsule Take 20 mg by mouth daily with supper.     furosemide (LASIX) 40 MG tablet Take 1 tablet by mouth once daily 90 tablet 0   guaiFENesin (MUCINEX) 600 MG 12 hr tablet Take 1 tablet (600 mg total) by mouth 2 (two) times daily as needed for cough or to loosen phlegm. 30 tablet 0   naproxen sodium (ANAPROX) 220 MG tablet Take 220-440 mg by mouth 3 (three) times daily as needed (pain. (Max 3 tablets/24 hrs.)).      ramipril (ALTACE) 5 MG capsule Take 1 capsule by mouth once daily 90 capsule 3   rosuvastatin (CRESTOR) 10 MG tablet Take 1 tablet (10 mg total) by mouth daily. 90 tablet 3   VENTOLIN HFA 108 (90 Base) MCG/ACT inhaler INHALE 2 PUFFS BY MOUTH EVERY 4 HOURS AS NEEDED FOR WHEEZING OR SHORTNESS OF BREATH 18 g 0   benzonatate (TESSALON) 100 MG capsule Take 1 capsule (100 mg total) by mouth 3 (three) times daily as needed. (Patient not taking: Reported  on 12/24/2021) 30 capsule 0   doxycycline (VIBRA-TABS) 100 MG tablet Take 1 tablet (100 mg total) by mouth 2 (two) times daily. (Patient not taking: Reported on 12/24/2021) 20 tablet 0   predniSONE (DELTASONE) 50 MG tablet Take 1 tablet (50 mg total) by mouth daily with breakfast. (Patient not taking: Reported on 12/24/2021) 5 tablet 0   promethazine-dextromethorphan (PROMETHAZINE-DM) 6.25-15 MG/5ML syrup Take 5 mLs by mouth 4 (four) times daily as needed. (Patient not taking: Reported on 12/24/2021) 118 mL 0   No current facility-administered medications on file prior to visit.   Allergies  Allergen Reactions   Codeine Other (See Comments)    Severe headaches    Tetanus Antitoxin Swelling and Other (See Comments)    Fever    Tetanus Toxoids Swelling  and Other (See Comments)    fever   Penicillins Rash   Social History   Socioeconomic History   Marital status: Married    Spouse name: Not on file   Number of children: Not on file   Years of education: Not on file   Highest education level: Not on file  Occupational History   Not on file  Tobacco Use   Smoking status: Never   Smokeless tobacco: Never  Vaping Use   Vaping Use: Never used  Substance and Sexual Activity   Alcohol use: No   Drug use: No   Sexual activity: Not Currently  Other Topics Concern   Not on file  Social History Narrative   Not on file   Social Determinants of Health   Financial Resource Strain: Not on file  Food Insecurity: Not on file  Transportation Needs: Not on file  Physical Activity: Not on file  Stress: Not on file  Social Connections: Not on file  Intimate Partner Violence: Not on file      Review of Systems  All other systems reviewed and are negative.      Objective:   Physical Exam Neck:     Vascular: No JVD.  Cardiovascular:     Rate and Rhythm: Normal rate and regular rhythm.     Heart sounds: Normal heart sounds. No murmur heard. Pulmonary:     Effort: Pulmonary effort is normal. No respiratory distress.     Breath sounds: Normal breath sounds. No stridor. No wheezing or rales.    Chest:    Musculoskeletal:     Lumbar back: Tenderness present. No bony tenderness. Decreased range of motion.       Legs:  Skin:        Chronic venous stasis changes in both legs.        Assessment & Plan:  Postherpetic neuralgia Patient has nervelike pain in the area where she has had shingles in the remote past.  She has had 4 separate episodes where the pain will reappear in this exact same area but has chronic sensitivity in that area.  I do not feel that this is active shingles but is rather postherpetic neuralgia.  We will try gabapentin 300 mg p.o. 3 times daily as needed for pain.  She denies any back pain to suggest  thoracic radiculopathy however if her pain persist I would recommend an MRI of the thoracic spine to evaluate further.

## 2022-01-11 ENCOUNTER — Encounter: Payer: Self-pay | Admitting: Family Medicine

## 2022-01-25 ENCOUNTER — Ambulatory Visit: Payer: Medicare Other | Admitting: Family Medicine

## 2022-06-03 ENCOUNTER — Encounter (INDEPENDENT_AMBULATORY_CARE_PROVIDER_SITE_OTHER): Payer: Self-pay | Admitting: *Deleted

## 2022-08-08 ENCOUNTER — Other Ambulatory Visit: Payer: Self-pay | Admitting: Family Medicine

## 2022-08-09 NOTE — Telephone Encounter (Signed)
Last reorder 07/20/21 #90 3 RF  Overdue lab work and [facility] pt MyChart message to call office and make appt.   Requested Prescriptions  Pending Prescriptions Disp Refills   rosuvastatin (CRESTOR) 10 MG tablet [Pharmacy Med Name: Rosuvastatin Calcium 10 MG Oral Tablet] 90 tablet 0    Sig: Take 1 tablet by mouth once daily     Cardiovascular:  Antilipid - Statins 2 Failed - 08/08/2022 11:27 PM      Failed - Cr in normal range and within 360 days    Creat  Date Value Ref Range Status  07/17/2021 0.93 0.50 - 1.05 mg/dL Final         Failed - Valid encounter within last 12 months    Recent Outpatient Visits           1 year ago Venous insufficiency   [place] Susy Frizzle, MD   1 year ago Bilateral sciatica   [place] Dennard Schaumann, Cammie Mcgee, MD   1 year ago Chronic fatigue   [place] Susy Frizzle, MD   2 years ago Acute bacterial rhinosinusitis   [place] Dennard Schaumann, Cammie Mcgee, MD   2 years ago Upper respiratory tract infection, unspecified type   [place], Jessica A, NP              Failed - Lipid Panel in normal range within the last 12 months    Cholesterol  Date Value Ref Range Status  07/17/2021 191 <200 mg/dL Final   LDL Cholesterol (Calc)  Date Value Ref Range Status  07/17/2021 127 (H) mg/dL (calc) Final    Comment:    Reference range: <100 . Desirable range <100 mg/dL for primary prevention;   <70 mg/dL for patients with CHD or diabetic patients  with > or = 2 CHD risk factors. Marland Kitchen LDL-C is now calculated using the [place]-Hopkins  calculation, which is a validated novel method providing  better accuracy than the Friedewald equation in the  estimation of LDL-C.  Cresenciano Genre et al. Annamaria Helling. 5916;384(66): 2061-2068  (http://education.[facility].com/faq/FAQ164)    HDL  Date Value Ref Range Status  07/17/2021 44 (L) > OR = 50 mg/dL Final    Triglycerides  Date Value Ref Range Status  07/17/2021 101 <150 mg/dL Final         Passed - Patient is not pregnant

## 2022-09-04 ENCOUNTER — Telehealth: Payer: Self-pay

## 2022-09-04 ENCOUNTER — Other Ambulatory Visit: Payer: Self-pay | Admitting: Family Medicine

## 2022-09-04 DIAGNOSIS — U071 COVID-19: Secondary | ICD-10-CM

## 2022-09-04 MED ORDER — NIRMATRELVIR/RITONAVIR (PAXLOVID)TABLET: 3.0000 | ORAL_TABLET | Freq: Two times a day (BID) | ORAL | 0 refills | Status: AC

## 2022-09-04 NOTE — Telephone Encounter (Signed)
Pt of Dr. Samella Parr who tested positive for Covid today. Pt asks if an anti-viral can be sent in? 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 Thank you.

## 2022-10-22 ENCOUNTER — Other Ambulatory Visit: Payer: Self-pay | Admitting: Family Medicine

## 2023-01-05 ENCOUNTER — Other Ambulatory Visit: Payer: Self-pay | Admitting: Family Medicine

## 2023-01-07 ENCOUNTER — Other Ambulatory Visit: Payer: Self-pay | Admitting: Family Medicine

## 2023-01-08 NOTE — Telephone Encounter (Signed)
Requested medication (s) are due for refill today: yes  Requested medication (s) are on the active medication list: yes  Last refill:  10/22/22  Future visit scheduled: no  Notes to clinic:  Unable to refill per protocol, courtesy refill already given, routing for provider approval.      Requested Prescriptions  Pending Prescriptions Disp Refills   rosuvastatin (CRESTOR) 10 MG tablet [Pharmacy Med Name: Rosuvastatin Calcium 10 MG Oral Tablet] 60 tablet 0    Sig: TAKE 1 TABLET BY MOUTH ONCE DAILY. NEED  CPE  APPT  WITH  PCP  FOR  FUTURE  REFILLS.     Cardiovascular:  Antilipid - Statins 2 Failed - 01/07/2023  1:29 PM      Failed - Cr in normal range and within 360 days    Creat  Date Value Ref Range Status  07/17/2021 0.93 0.50 - 1.05 mg/dL Final         Failed - Valid encounter within last 12 months    Recent Outpatient Visits           1 year ago Venous insufficiency   [facility] Donita Brooks, MD   2 years ago Bilateral sciatica   [facility] Tanya Nones, Priscille Heidelberg, MD   2 years ago Chronic fatigue   [facility] Donita Brooks, MD   2 years ago Acute bacterial rhinosinusitis   [facility] Tanya Nones, Priscille Heidelberg, MD   2 years ago Upper respiratory tract infection, unspecified type   [facility] Cathlean Marseilles A, NP              Failed - Lipid Panel in normal range within the last 12 months    Cholesterol  Date Value Ref Range Status  07/17/2021 191 <200 mg/dL Final   LDL Cholesterol (Calc)  Date Value Ref Range Status  07/17/2021 127 (H) mg/dL (calc) Final    Comment:    Reference range: <100 . Desirable range <100 mg/dL for primary prevention;   <70 mg/dL for patients with CHD or diabetic patients  with > or = 2 CHD risk factors. Marland Kitchen LDL-C is now calculated using the [place]-Hopkins  calculation, which is a validated novel method providing  better accuracy than the  Friedewald equation in the  estimation of LDL-C.  Horald Pollen et al. Lenox Ahr. 6962;952(84): 2061-2068  (http://education.[facility].com/faq/FAQ164)    HDL  Date Value Ref Range Status  07/17/2021 44 (L) > OR = 50 mg/dL Final   Triglycerides  Date Value Ref Range Status  07/17/2021 101 <150 mg/dL Final         Passed - Patient is not pregnant

## 2023-01-19 ENCOUNTER — Other Ambulatory Visit: Payer: Self-pay | Admitting: Family Medicine

## 2023-01-20 NOTE — Telephone Encounter (Signed)
Patient needs OV for additional refills.  Requested Prescriptions  Pending Prescriptions Disp Refills   ramipril (ALTACE) 5 MG capsule [Pharmacy Med Name: Ramipril 5 MG Oral Capsule] 30 capsule 0    Sig: Take 1 capsule by mouth once daily     Cardiovascular:  ACE Inhibitors Failed - 01/19/2023  5:27 PM      Failed - Cr in normal range and within 180 days    Creat  Date Value Ref Range Status  07/17/2021 0.93 0.50 - 1.05 mg/dL Final         Failed - K in normal range and within 180 days    Potassium  Date Value Ref Range Status  07/17/2021 4.9 3.5 - 5.3 mmol/L Final         Failed - Last BP in normal range    BP Readings from Last 1 Encounters:  12/24/21 (!) 148/86         Failed - Valid encounter within last 6 months    Recent Outpatient Visits           1 year ago Venous insufficiency   [facility] Donita Brooks, MD   2 years ago Bilateral sciatica   [facility] Donita Brooks, MD   2 years ago Chronic fatigue   [facility] Donita Brooks, MD   2 years ago Acute bacterial rhinosinusitis   [facility] Tanya Nones Priscille Heidelberg, MD   2 years ago Upper respiratory tract infection, unspecified type   [facility] Valentino Nose, NP              Passed - Patient is not pregnant

## 2023-02-21 ENCOUNTER — Other Ambulatory Visit: Payer: Self-pay | Admitting: Family Medicine

## 2023-02-24 NOTE — Telephone Encounter (Signed)
Requested medication (s) are due for refill today: yes  Requested medication (s) are on the active medication list: yes  Last refill:  01/20/23  Future visit scheduled: no  Notes to clinic:  Unable to refill per protocol, courtesy refill already given, routing for provider approval.      Requested Prescriptions  Pending Prescriptions Disp Refills   ramipril (ALTACE) 5 MG capsule [Pharmacy Med Name: Ramipril 5 MG Oral Capsule] 30 capsule 0    Sig: Take 1 capsule by mouth once daily     Cardiovascular:  ACE Inhibitors Failed - 02/21/2023 10:28 AM      Failed - Cr in normal range and within 180 days    Creat  Date Value Ref Range Status  07/17/2021 0.93 0.50 - 1.05 mg/dL Final         Failed - K in normal range and within 180 days    Potassium  Date Value Ref Range Status  07/17/2021 4.9 3.5 - 5.3 mmol/L Final         Failed - Last BP in normal range    BP Readings from Last 1 Encounters:  12/24/21 (!) 148/86         Failed - Valid encounter within last 6 months    Recent Outpatient Visits           1 year ago Venous insufficiency   [facility] Donita Brooks, MD   2 years ago Bilateral sciatica   [facility] Donita Brooks, MD   2 years ago Chronic fatigue   [facility] Donita Brooks, MD   2 years ago Acute bacterial rhinosinusitis   [facility] Tanya Nones Priscille Heidelberg, MD   2 years ago Upper respiratory tract infection, unspecified type   [facility] Valentino Nose, NP              Passed - Patient is not pregnant

## 2023-02-28 ENCOUNTER — Other Ambulatory Visit (INDEPENDENT_AMBULATORY_CARE_PROVIDER_SITE_OTHER): Payer: Self-pay | Admitting: Family Medicine

## 2023-03-11 ENCOUNTER — Other Ambulatory Visit: Payer: Self-pay | Admitting: Family Medicine

## 2023-03-13 NOTE — Telephone Encounter (Signed)
Requested medications are due for refill today.  yes  Requested medications are on the active medications list.  yes  Last refill. 01/20/2023 #30 0 rf  Future visit scheduled.   no  Notes to clinic.  Pt last seen 12/24/2021 - courtesy refill already given.    Requested Prescriptions  Pending Prescriptions Disp Refills   ramipril (ALTACE) 5 MG capsule [Pharmacy Med Name: Ramipril 5 MG Oral Capsule] 30 capsule 0    Sig: Take 1 capsule by mouth once daily     Cardiovascular:  ACE Inhibitors Failed - 03/11/2023 10:46 PM      Failed - Cr in normal range and within 180 days    Creat  Date Value Ref Range Status  07/17/2021 0.93 0.50 - 1.05 mg/dL Final         Failed - K in normal range and within 180 days    Potassium  Date Value Ref Range Status  07/17/2021 4.9 3.5 - 5.3 mmol/L Final         Failed - Last BP in normal range    BP Readings from Last 1 Encounters:  12/24/21 (!) 148/86         Failed - Valid encounter within last 6 months    Recent Outpatient Visits           1 year ago Venous insufficiency   [facility] Donita Brooks, MD   2 years ago Bilateral sciatica   [facility] Donita Brooks, MD   2 years ago Chronic fatigue   [facility] Donita Brooks, MD   2 years ago Acute bacterial rhinosinusitis   [facility] Tanya Nones Priscille Heidelberg, MD   2 years ago Upper respiratory tract infection, unspecified type   [facility] Valentino Nose, NP              Passed - Patient is not pregnant

## 2023-03-14 ENCOUNTER — Other Ambulatory Visit: Payer: Self-pay | Admitting: Family Medicine

## 2023-03-14 MED ORDER — RAMIPRIL 5 MG PO CAPS: 5.0000 mg | ORAL_CAPSULE | Freq: Every day | ORAL | 3 refills | Status: DC

## 2023-03-14 NOTE — Telephone Encounter (Signed)
Patient called to request courtesy refill; LOV 12/24/2021  Med refill appointment scheduled for Friday, 03/21/2023.  Pharmacy confirmed as:  ramipril (ALTACE) 5 MG capsule [[id number]]  **last dose taken today**  Please advise at [phone].

## 2023-03-21 ENCOUNTER — Ambulatory Visit (INDEPENDENT_AMBULATORY_CARE_PROVIDER_SITE_OTHER): Payer: Medicare Other | Admitting: Family Medicine

## 2023-03-21 VITALS — BP 140/84 | HR 88 | Temp 97.9°F | Ht 63.0 in | Wt 286.6 lb

## 2023-03-21 DIAGNOSIS — K219 Gastro-esophageal reflux disease without esophagitis: Secondary | ICD-10-CM | POA: Diagnosis not present

## 2023-03-21 DIAGNOSIS — I10 Essential (primary) hypertension: Secondary | ICD-10-CM

## 2023-03-21 DIAGNOSIS — B0229 Other postherpetic nervous system involvement: Secondary | ICD-10-CM

## 2023-03-21 DIAGNOSIS — E7800 Pure hypercholesterolemia, unspecified: Secondary | ICD-10-CM | POA: Diagnosis not present

## 2023-03-21 DIAGNOSIS — I872 Venous insufficiency (chronic) (peripheral): Secondary | ICD-10-CM | POA: Diagnosis not present

## 2023-03-21 MED ORDER — RAMIPRIL 5 MG PO CAPS: 5.0000 mg | ORAL_CAPSULE | Freq: Every day | ORAL | 1 refills | Status: DC

## 2023-03-21 MED ORDER — DOXYCYCLINE HYCLATE 100 MG PO TABS: 100.0000 mg | ORAL_TABLET | Freq: Every day | ORAL | 0 refills | Status: DC

## 2023-03-21 MED ORDER — ESOMEPRAZOLE MAGNESIUM 20 MG PO CPDR: 20.0000 mg | DELAYED_RELEASE_CAPSULE | Freq: Every day | ORAL | 1 refills | Status: AC

## 2023-03-21 MED ORDER — FUROSEMIDE 40 MG PO TABS: 40.0000 mg | ORAL_TABLET | Freq: Every day | ORAL | 1 refills | Status: AC

## 2023-03-21 NOTE — Progress Notes (Signed)
Subjective:    Patient ID: Jessica [name], female    DOB: [birth year], 67 y.o.   MRN: [id number]  HPI  Patient has a history of hypertension as well as hyperlipidemia.  She has been off the rosuvastatin for quite some time.  She is here today so that she can get a refill on her medication.  Her blood pressure today is borderline.  She denies any chest pain or shortness of breath or dyspnea on exertion.  She does complain of some pain in her left biceps especially at night when she is trying to sleep.  She also had 1 episode of a racing heart rate when she awoke from sleep suddenly.  This stopped spontaneously.  She does have a history of sleep apnea.  She denies any palpitations or tachycardia during the day.  She denies any syncope or near syncope. Past Medical History:  Diagnosis Date   Anemia    per patient, had it years ago before hysterectomy years ago   Arthritis    Asthma    Bronchitis    Carpal tunnel syndrome of right wrist    Chronic venous insufficiency    GERD (gastroesophageal reflux disease)    History of hiatal hernia    Hypertension    Pneumonia    PONV (postoperative nausea and vomiting)    Past Surgical History:  Procedure Laterality Date   ABDOMINAL HYSTERECTOMY     CARPAL TUNNEL RELEASE Right 12/16/2019   Procedure: RIGHT CARPAL TUNNEL RELEASE;  Surgeon: Cammy Copa, MD;  Location: [facility] OR;  Service: Orthopedics;  Laterality: Right;   COLONOSCOPY  06/05/2012   Procedure: COLONOSCOPY;  Surgeon: Malissa Hippo, MD;  Location: [facility];  Service: Endoscopy;  Laterality: N/A;  125-changed to 1225 Ann to notify pt   NISSEN FUNDOPLICATION     x2   SHOULDER ARTHROSCOPY WITH SUBACROMIAL DECOMPRESSION, ROTATOR CUFF REPAIR AND BICEP TENDON REPAIR Right 12/16/2019   Procedure: RIGHT SHOULDER ARTHROSCOPY, BICEPS TENODESIS, MINI OPEN ROTATOR CUFF TEAR REPAIR, DEBRIDEMENT;  Surgeon: Cammy Copa, MD;  Location: [facility] OR;  Service: Orthopedics;  Laterality: Right;    Current Outpatient Medications on File Prior to Visit  Medication Sig Dispense Refill   acetaminophen (TYLENOL 8 HOUR ARTHRITIS PAIN) 650 MG CR tablet      benzonatate (TESSALON) 100 MG capsule Take 1 capsule (100 mg total) by mouth 3 (three) times daily as needed. 30 capsule 0   cetirizine (ZYRTEC) 10 MG tablet Take 10 mg by mouth daily as needed (allergies.).      doxycycline (VIBRA-TABS) 100 MG tablet Take 1 tablet (100 mg total) by mouth 2 (two) times daily. 20 tablet 0   gabapentin (NEURONTIN) 300 MG capsule Take 1 capsule (300 mg total) by mouth 3 (three) times daily. 90 capsule 3   naproxen sodium (ANAPROX) 220 MG tablet Take 220-440 mg by mouth 3 (three) times daily as needed (pain. (Max 3 tablets/24 hrs.)).      VENTOLIN HFA 108 (90 Base) MCG/ACT inhaler INHALE 2 PUFFS BY MOUTH EVERY 4 HOURS AS NEEDED FOR WHEEZING OR SHORTNESS OF BREATH 18 g 0   rosuvastatin (CRESTOR) 10 MG tablet TAKE 1 TABLET BY MOUTH ONCE DAILY. PT NEED CPE APPT WITH PCP FOR FUTURE REFILLS (Patient not taking: Reported on 03/21/2023) 60 tablet 0   No current facility-administered medications on file prior to visit.   Allergies  Allergen Reactions   Codeine Other (See Comments)    Severe headaches  Tetanus Antitoxin Swelling and Other (See Comments)    Fever    Tetanus Toxoids Swelling and Other (See Comments)    fever   Penicillins Rash   Social History   Socioeconomic History   Marital status: Married    Spouse name: Not on file   Number of children: Not on file   Years of education: Not on file   Highest education level: Not on file  Occupational History   Not on file  Tobacco Use   Smoking status: Never   Smokeless tobacco: Never  Vaping Use   Vaping status: Never Used  Substance and Sexual Activity   Alcohol use: No   Drug use: No   Sexual activity: Not Currently  Other Topics Concern   Not on file  Social History Narrative   Not on file   Social Determinants of Health    Financial Resource Strain: Not on file  Food Insecurity: Not on file  Transportation Needs: Not on file  Physical Activity: Not on file  Stress: Not on file  Social Connections: Not on file  Intimate Partner Violence: Not on file      Review of Systems  All other systems reviewed and are negative.      Objective:   Physical Exam Neck:     Vascular: No JVD.  Cardiovascular:     Rate and Rhythm: Normal rate and regular rhythm.     Heart sounds: Normal heart sounds. No murmur heard. Pulmonary:     Effort: Pulmonary effort is normal. No respiratory distress.     Breath sounds: Normal breath sounds. No stridor. No wheezing or rales.  Skin:        Chronic venous stasis changes in both legs.        Assessment & Plan:   Primary hypertension - Plan: furosemide (LASIX) 40 MG tablet, ramipril (ALTACE) 5 MG capsule, CBC with Differential/Platelet, COMPLETE METABOLIC PANEL WITH GFR, Lipid panel  Pure hypercholesterolemia  Gastroesophageal reflux disease, unspecified whether esophagitis present - Plan: esomeprazole (NEXIUM) 20 MG capsule  Chronic venous insufficiency  Postherpetic neuralgia Blood pressure today is borderline.  I recommended patient check her blood pressure daily at home over the next week and notify me if greater than 140/90.  I will check a CBC a CMP and a fasting lipid panel today.  I like to see her LDL cholesterol less than 409.  Patient also has a papular erythematous rash over forehead cheeks and chin.  I believe that this is most likely rosacea.  I will treat the patient with doxycycline 100 mg p.o. daily for 1 week.  Patient will call back in a month and let me know how she is doing.  If she is doing better at that point I will try to transition the patient to [facility] for treatment.

## 2023-03-22 LAB — CBC WITH DIFFERENTIAL/PLATELET
Absolute Monocytes: 553 {cells}/uL (ref 200–950)
Basophils Absolute: 52 {cells}/uL (ref 0–200)
Basophils Relative: 0.8 %
Eosinophils Absolute: 124 {cells}/uL (ref 15–500)
Eosinophils Relative: 1.9 %
HCT: 39.1 % (ref 35.0–45.0)
Hemoglobin: 13 g/dL (ref 11.7–15.5)
Lymphs Abs: 2509 {cells}/uL (ref 850–3900)
MCH: 27.9 pg (ref 27.0–33.0)
MCHC: 33.2 g/dL (ref 32.0–36.0)
MCV: 83.9 fL (ref 80.0–100.0)
MPV: 11.9 fL (ref 7.5–12.5)
Monocytes Relative: 8.5 %
Neutro Abs: 3263 {cells}/uL (ref 1500–7800)
Neutrophils Relative %: 50.2 %
Platelets: 196 10*3/uL (ref 140–400)
RBC: 4.66 10*6/uL (ref 3.80–5.10)
RDW: 13.3 % (ref 11.0–15.0)
Total Lymphocyte: 38.6 %
WBC: 6.5 10*3/uL (ref 3.8–10.8)

## 2023-03-22 LAB — COMPLETE METABOLIC PANEL WITH GFR
AG Ratio: 1.2 (calc) (ref 1.0–2.5)
ALT: 16 U/L (ref 6–29)
AST: 13 U/L (ref 10–35)
Albumin: 4 g/dL (ref 3.6–5.1)
Alkaline phosphatase (APISO): 84 U/L (ref 37–153)
BUN: 15 mg/dL (ref 7–25)
CO2: 28 mmol/L (ref 20–32)
Calcium: 9.8 mg/dL (ref 8.6–10.4)
Chloride: 104 mmol/L (ref 98–110)
Creat: 0.86 mg/dL (ref 0.50–1.05)
Globulin: 3.3 g/dL (ref 1.9–3.7)
Glucose, Bld: 81 mg/dL (ref 65–99)
Potassium: 4.5 mmol/L (ref 3.5–5.3)
Sodium: 141 mmol/L (ref 135–146)
Total Bilirubin: 0.4 mg/dL (ref 0.2–1.2)
Total Protein: 7.3 g/dL (ref 6.1–8.1)
eGFR: 74 mL/min/{1.73_m2} (ref >=60)

## 2023-03-22 LAB — LIPID PANEL
Cholesterol: 195 mg/dL (ref <200)
HDL: 43 mg/dL — ABNORMAL LOW (ref >=50)
LDL Cholesterol (Calc): 132 mg/dL — ABNORMAL HIGH
Non-HDL Cholesterol (Calc): 152 mg/dL — ABNORMAL HIGH (ref <130)
Total CHOL/HDL Ratio: 4.5 (calc) (ref <5.0)
Triglycerides: 101 mg/dL (ref <150)

## 2023-04-18 ENCOUNTER — Other Ambulatory Visit: Payer: Self-pay | Admitting: Family Medicine

## 2023-04-18 DIAGNOSIS — Z1211 Encounter for screening for malignant neoplasm of colon: Secondary | ICD-10-CM

## 2023-04-18 DIAGNOSIS — Z1212 Encounter for screening for malignant neoplasm of rectum: Secondary | ICD-10-CM

## 2023-04-29 ENCOUNTER — Other Ambulatory Visit: Payer: Self-pay | Admitting: Family Medicine

## 2023-05-02 ENCOUNTER — Other Ambulatory Visit: Payer: Self-pay | Admitting: Family Medicine

## 2023-05-02 MED ORDER — METRONIDAZOLE 1 % EX GEL: Freq: Every day | CUTANEOUS | 11 refills | Status: AC

## 2023-05-02 MED ORDER — ROSUVASTATIN CALCIUM 10 MG PO TABS
10.0000 mg | ORAL_TABLET | Freq: Every day | ORAL | 3 refills | Status: DC
Start: 2023-05-02 — End: 2023-11-20

## 2023-05-19 ENCOUNTER — Encounter: Payer: Self-pay | Admitting: Family Medicine

## 2023-05-22 LAB — COLOGUARD: COLOGUARD: NEGATIVE

## 2023-06-04 ENCOUNTER — Telehealth: Payer: Self-pay | Admitting: Family Medicine

## 2023-06-04 NOTE — Telephone Encounter (Signed)
Pt is needing her medication for her uti she has spoken to [facility] regarding the uti medication she needs the medication sent over to her pharmacy today.

## 2023-07-17 ENCOUNTER — Other Ambulatory Visit: Payer: Self-pay | Admitting: Family Medicine

## 2023-07-17 ENCOUNTER — Telehealth: Payer: Self-pay

## 2023-07-17 MED ORDER — NIRMATRELVIR/RITONAVIR (PAXLOVID)TABLET: 3.0000 | ORAL_TABLET | Freq: Two times a day (BID) | ORAL | 0 refills | Status: AC

## 2023-07-17 NOTE — Telephone Encounter (Signed)
 Copied from CRM [id number]. Topic: Clinical - Medical Advice >> Jul 17, 2023  2:28 PM Shelah Lewandowsky wrote: Reason for CRM: patient just tested positive for Covid today and needs antibiotics, please call [phone]

## 2023-08-21 ENCOUNTER — Emergency Department (HOSPITAL_BASED_OUTPATIENT_CLINIC_OR_DEPARTMENT_OTHER)
Admission: EM | Admit: 2023-08-21 | Discharge: 2023-08-22 | Disposition: A | Discharge status: Home / Self Care | Payer: Medicare Other | Attending: Emergency Medicine | Admitting: Emergency Medicine

## 2023-08-21 ENCOUNTER — Ambulatory Visit: Payer: Self-pay | Admitting: Family Medicine

## 2023-08-21 ENCOUNTER — Encounter (HOSPITAL_BASED_OUTPATIENT_CLINIC_OR_DEPARTMENT_OTHER): Payer: Self-pay | Admitting: Emergency Medicine

## 2023-08-21 ENCOUNTER — Other Ambulatory Visit: Payer: Self-pay

## 2023-08-21 ENCOUNTER — Emergency Department (HOSPITAL_BASED_OUTPATIENT_CLINIC_OR_DEPARTMENT_OTHER): Payer: Medicare Other | Admitting: Radiology

## 2023-08-21 DIAGNOSIS — I10 Essential (primary) hypertension: Secondary | ICD-10-CM | POA: Diagnosis not present

## 2023-08-21 DIAGNOSIS — X58XXXA Exposure to other specified factors, initial encounter: Secondary | ICD-10-CM | POA: Diagnosis not present

## 2023-08-21 DIAGNOSIS — J45909 Unspecified asthma, uncomplicated: Secondary | ICD-10-CM | POA: Diagnosis not present

## 2023-08-21 DIAGNOSIS — S46912A Strain of unspecified muscle, fascia and tendon at shoulder and upper arm level, left arm, initial encounter: Secondary | ICD-10-CM | POA: Insufficient documentation

## 2023-08-21 DIAGNOSIS — Z79899 Other long term (current) drug therapy: Secondary | ICD-10-CM | POA: Insufficient documentation

## 2023-08-21 DIAGNOSIS — R0789 Other chest pain: Secondary | ICD-10-CM | POA: Insufficient documentation

## 2023-08-21 DIAGNOSIS — R079 Chest pain, unspecified: Secondary | ICD-10-CM | POA: Diagnosis present

## 2023-08-21 LAB — CBC
HCT: 39.1 % (ref 36.0–46.0)
Hemoglobin: 12.8 g/dL (ref 12.0–15.0)
MCH: 27.4 pg (ref 26.0–34.0)
MCHC: 32.7 g/dL (ref 30.0–36.0)
MCV: 83.5 fL (ref 80.0–100.0)
Platelets: 180 10*3/uL (ref 150–400)
RBC: 4.68 MIL/uL (ref 3.87–5.11)
RDW: 13.9 % (ref 11.5–15.5)
WBC: 7.5 10*3/uL (ref 4.0–10.5)
nRBC: 0 % (ref 0.0–0.2)

## 2023-08-21 LAB — BASIC METABOLIC PANEL
Anion gap: 8 (ref 5–15)
BUN: 14 mg/dL (ref 8–23)
CO2: 28 mmol/L (ref 22–32)
Calcium: 9.4 mg/dL (ref 8.9–10.3)
Chloride: 103 mmol/L (ref 98–111)
Creatinine, Ser: 0.85 mg/dL (ref 0.44–1.00)
GFR, Estimated: >60 mL/min (ref >60)
Glucose, Bld: 111 mg/dL — ABNORMAL HIGH (ref 70–99)
Potassium: 3.5 mmol/L (ref 3.5–5.1)
Sodium: 139 mmol/L (ref 135–145)

## 2023-08-21 LAB — TROPONIN I (HIGH SENSITIVITY): Troponin I (High Sensitivity): 4 ng/L (ref <18)

## 2023-08-21 NOTE — ED Provider Triage Note (Signed)
 Emergency Medicine Provider Triage Evaluation Note  Jessica [name] , a 68 y.o. female  was evaluated in triage.  Pt complains of chest pain and posterior shoulder pain.  Review of Systems  Positive: As above Negative: As above  Physical Exam  BP (!) 155/82 (BP Location: Right Arm)   Pulse 87   Temp 98.1 F (36.7 C)   Resp 18   Ht 5' 4 (1.626 m)   Wt 129.3 kg   SpO2 96%   BMI 48.92 kg/m  Gen:   Awake, no distress   Resp:  Normal effort MSK:   Moves extremities without difficulty  Other:    Medical Decision Making  Medically screening exam initiated at 4:09 PM.  Appropriate orders placed.  Rollene SHAUNNA Crean was informed that the remainder of the evaluation will be completed by another provider, this initial triage assessment does not replace that evaluation, and the importance of remaining in the ED until their evaluation is complete.    Hildegard Loge, PA-C 08/21/23 1610

## 2023-08-21 NOTE — Telephone Encounter (Signed)
 Chief Complaint: Chest pain, arm pain, shoulder blade pain Symptoms: Chest pain, left arm pain, shouler blade pain Frequency: 3 days Pertinent Negatives: Patient denies nausea, abdominal pain Disposition: [x] ED /[] Urgent Care (no appt availability in office) / [] Appointment(In office/virtual)/ []  [facility]/ [] Home Care/ [] Refused Recommended Disposition /[] [facility]/ []  Follow-up with PCP Additional Notes: Patient called in stating for the past 3 days she has been experiencing discomfort in her chest, left arm and left shoulder blade. Patient describes the pain as a pressure rating 5-6/10 in discomfort. Patient states for the most part the pain stays constant. Patient has also had an intermittent headache and feeling hot flashes in her face. Patient has a cardiac history with 2 ablations, venous insufficiency, and high blood pressure. Patient states when she took her bp yesterday with a wrist cuff, the pain it caused in her arm was almost unbearable. BP at that time was 145 systolic, but unsure of diastolic. Patient states father has history of quadruple bypass and mother has afib and a pacemaker. Patient denies any activity or injury to result in muscle pull or pinched nerve. This RN advised patient to be seen in ED asap for evaluation. Patient verbalized understanding and agreed, asking for the nearest ER in [place], stating she did not want to go to [facility]. This RN assisted patient with information on nearest ED and patient stated she will be going for evaluation now. This RN advised to have another adult drive.   Copied from CRM [id number]. Topic: Clinical - Red Word Triage >> Aug 21, 2023  2:44 PM Laurier C wrote: Red Word that prompted transfer to Nurse Triage: Patient has been having some pain in her left shoulder blade, chest and arm for the last 3 days. Reason for Disposition  [1] Chest pain lasts > 5 minutes AND [2] occurred in past 3 days (72 hours) (Exception:  Feels exactly the same as previously diagnosed heartburn and has accompanying sour taste in mouth.)  Answer Assessment - Initial Assessment Questions 1. LOCATION: Where does it hurt?       Chest, left shoulder blade, down back of left arm 2. RADIATION: Does the pain go anywhere else? (e.g., into neck, jaw, arms, back)     Chest, arm, shoulder  3. ONSET: When did the chest pain begin? (Minutes, hours or days)      3 days  4. PATTERN: Does the pain come and go, or has it been constant since it started?  Does it get worse with exertion?      Goes away a little bit and comes back 5. DURATION: How long does it last (e.g., seconds, minutes, hours)     Too ongoing 6. SEVERITY: How bad is the pain?  (e.g., Scale 1-10; mild, moderate, or severe)    - MILD (1-3): doesn't interfere with normal activities     - MODERATE (4-7): interferes with normal activities or awakens from sleep    - SEVERE (8-10): excruciating pain, unable to do any normal activities       6 7. CARDIAC RISK FACTORS: Do you have any history of heart problems or risk factors for heart disease? (e.g., angina, prior heart attack; diabetes, high blood pressure, high cholesterol, smoker, or strong family history of heart disease)     High blood pressure, venous insufficiency, two ablasions, family history of cardiac disease 8. PULMONARY RISK FACTORS: Do you have any history of lung disease?  (e.g., blood clots in lung, asthma, emphysema, birth  control pills)     Asthma 9. CAUSE: What do you think is causing the chest pain?     Unsure what is causing it 10. OTHER SYMPTOMS: Do you have any other symptoms? (e.g., dizziness, nausea, vomiting, sweating, fever, difficulty breathing, cough)       Hot flash in face, short winded  Protocols used: Chest Pain-A-AH

## 2023-08-21 NOTE — ED Triage Notes (Signed)
 C/o left sided CP x 2 days. Rads into back and down left arm. No cardiac hx. Denies SHOB.

## 2023-08-22 DIAGNOSIS — S46912A Strain of unspecified muscle, fascia and tendon at shoulder and upper arm level, left arm, initial encounter: Secondary | ICD-10-CM | POA: Diagnosis not present

## 2023-08-22 MED ORDER — NAPROXEN 250 MG PO TABS
250.0000 mg | ORAL_TABLET | Freq: Once | ORAL | Status: AC
  Administered 2023-08-22: 250 mg via ORAL
  Filled 2023-08-22: qty 1

## 2023-08-22 MED ORDER — ACETAMINOPHEN 500 MG PO TABS
1000.0000 mg | ORAL_TABLET | Freq: Once | ORAL | Status: AC
  Administered 2023-08-22: 1000 mg via ORAL
  Filled 2023-08-22: qty 2

## 2023-08-22 MED ORDER — GABAPENTIN 300 MG PO CAPS
300.0000 mg | ORAL_CAPSULE | Freq: Once | ORAL | Status: AC
  Administered 2023-08-22: 300 mg via ORAL
  Filled 2023-08-22: qty 1

## 2023-08-22 NOTE — Discharge Instructions (Addendum)

## 2023-08-22 NOTE — ED Provider Notes (Signed)
 [facility] EMERGENCY DEPARTMENT AT [facility] Provider Note  CSN: [id number] Arrival date & time: 08/21/23 1551  Chief Complaint(s) Chest Pain  HPI Jessica [name] is a 68 y.o. female     Chest Pain Chest pain location: left upper chest. Pain quality: dull, pressure and sharp   Pain radiates to:  Upper back and L arm Pain severity:  Moderate Duration:  2 days Timing:  Constant Chronicity:  New Relieved by:  Certain positions Worsened by:  Movement and certain positions Associated symptoms: no nausea, no shortness of breath and no vomiting     Past Medical History Past Medical History:  Diagnosis Date   Anemia    per patient, had it years ago before hysterectomy years ago   Arthritis    Asthma    Bronchitis    Carpal tunnel syndrome of right wrist    Chronic venous insufficiency    GERD (gastroesophageal reflux disease)    History of hiatal hernia    Hypertension    Pneumonia    PONV (postoperative nausea and vomiting)    Patient Active Problem List   Diagnosis Date Noted   Upper respiratory tract infection 07/18/2020   Hypertension 04/12/2019   Glucose intolerance 04/12/2019   Chronic venous insufficiency 04/06/2019   Osteopenia 04/06/2019   Home Medication(s) Prior to Admission medications   Medication Sig Start Date End Date Taking? Authorizing Provider  acetaminophen  (TYLENOL  8 HOUR ARTHRITIS PAIN) 650 MG CR tablet  10/27/20   [provider]  benzonatate  (TESSALON ) 100 MG capsule Take 1 capsule (100 mg total) by mouth 3 (three) times daily as needed. 09/18/21   Vivienne Delon HERO, PA-C  cetirizine (ZYRTEC) 10 MG tablet Take 10 mg by mouth daily as needed (allergies.).     [provider]  doxycycline  (VIBRA -TABS) 100 MG tablet Take 1 tablet (100 mg total) by mouth 2 (two) times daily. 09/18/21   Vivienne Delon HERO, PA-C  doxycycline  (VIBRA -TABS) 100 MG tablet Take 1 tablet (100 mg total) by mouth daily. 03/21/23   Duanne Butler DASEN,  MD  esomeprazole  (NEXIUM ) 20 MG capsule Take 1 capsule (20 mg total) by mouth daily with supper. 03/21/23   Duanne Butler DASEN, MD  furosemide  (LASIX ) 40 MG tablet Take 1 tablet (40 mg total) by mouth daily. 03/21/23   Duanne Butler DASEN, MD  gabapentin  (NEURONTIN ) 300 MG capsule Take 1 capsule (300 mg total) by mouth 3 (three) times daily. 12/24/21   Duanne Butler DASEN, MD  metroNIDAZOLE  (METROGEL ) 1 % gel Apply topically daily. 05/02/23   Duanne Butler DASEN, MD  naproxen  sodium (ANAPROX ) 220 MG tablet Take 220-440 mg by mouth 3 (three) times daily as needed (pain. (Max 3 tablets/24 hrs.)).     [provider]  ramipril  (ALTACE ) 5 MG capsule Take 1 capsule (5 mg total) by mouth daily. 03/21/23   Duanne Butler DASEN, MD  rosuvastatin  (CRESTOR ) 10 MG tablet Take 1 tablet (10 mg total) by mouth daily. 05/02/23   Duanne Butler DASEN, MD  VENTOLIN  HFA 108 (90 Base) MCG/ACT inhaler INHALE 2 PUFFS BY MOUTH EVERY 4 HOURS AS NEEDED FOR WHEEZING OR SHORTNESS OF BREATH 08/21/21   Duanne Butler DASEN, MD  Allergies Codeine, Tetanus antitoxin, Tetanus toxoids, and Penicillins  Review of Systems Review of Systems  Respiratory:  Negative for shortness of breath.   Cardiovascular:  Positive for chest pain.  Gastrointestinal:  Negative for nausea and vomiting.   As noted in HPI  Physical Exam Vital Signs  I have reviewed the triage vital signs BP (!) 148/76   Pulse 91   Temp 97.7 F (36.5 C)   Resp 16   Ht 5' 4 (1.626 m)   Wt 129.3 kg   SpO2 95%   BMI 48.92 kg/m   Physical Exam Vitals reviewed.  Constitutional:      General: She is not in acute distress.    Appearance: She is well-developed. She is obese. She is not diaphoretic.  HENT:     Head: Normocephalic and atraumatic.     Nose: Nose normal.  Eyes:     General: No scleral icterus.       Right eye: No discharge.         Left eye: No discharge.     Conjunctiva/sclera: Conjunctivae normal.     Pupils: Pupils are equal, round, and reactive to light.  Cardiovascular:     Rate and Rhythm: Normal rate and regular rhythm.     Heart sounds: No murmur heard.    No friction rub. No gallop.  Pulmonary:     Effort: Pulmonary effort is normal. No respiratory distress.     Breath sounds: Normal breath sounds. No stridor. No rales.  Chest:     Chest wall: Tenderness present.    Abdominal:     General: There is no distension.     Palpations: Abdomen is soft.     Tenderness: There is no abdominal tenderness.  Musculoskeletal:     Cervical back: Normal range of motion and neck supple.     Thoracic back: Spasms and tenderness present.       Back:  Skin:    General: Skin is warm and dry.     Findings: No erythema or rash.  Neurological:     Mental Status: She is alert and oriented to person, place, and time.     ED Results and Treatments Labs (all labs ordered are listed, but only abnormal results are displayed) Labs Reviewed  BASIC METABOLIC PANEL - Abnormal; Notable for the following components:      Result Value   Glucose, Bld 111 (*)    All other components within normal limits  CBC  TROPONIN I (HIGH SENSITIVITY)                                                                                                                         EKG  EKG Interpretation Date/Time:  Thursday August 21 2023 16:05:22 EST Ventricular Rate:  83 PR Interval:  204 QRS Duration:  72 QT Interval:  328 QTC Calculation: 385 R Axis:   79  Text Interpretation: Normal sinus rhythm Possible Anterior infarct , age undetermined Abnormal  ECG When compared with ECG of 09-Dec-2019 08:52, Borderline criteria for Anterior infarct are now Present No significant change since last tracing Confirmed by Trine Likes [id number]) on 08/22/2023 12:12:33 AM       Radiology DG Chest 2 View Result Date: 08/21/2023 CLINICAL DATA:   Chest pain. EXAM: CHEST - 2 VIEW COMPARISON:  Chest x-ray February 13, 23. FINDINGS: The heart size and mediastinal contours are within normal limits. Both lungs are clear. No visible pleural effusions or pneumothorax. No acute osseous abnormality. IMPRESSION: No active cardiopulmonary disease. Electronically Signed   By: Gilmore GORMAN Molt M.D.   On: 08/21/2023 16:25    Medications Ordered in ED Medications  gabapentin  (NEURONTIN ) capsule 300 mg (300 mg Oral Given 08/22/23 0032)  naproxen  (NAPROSYN ) tablet 250 mg (250 mg Oral Given 08/22/23 0032)  acetaminophen  (TYLENOL ) tablet 1,000 mg (1,000 mg Oral Given 08/22/23 0032)   Procedures Procedures  (including critical care time) Medical Decision Making / ED Course   Medical Decision Making Amount and/or Complexity of Data Reviewed Labs: ordered. Decision-making details documented in ED Course. Radiology: ordered and independent interpretation performed. Decision-making details documented in ED Course. ECG/medicine tests: ordered and independent interpretation performed. Decision-making details documented in ED Course.  Risk OTC drugs. Prescription drug management.     Clinical Course as of 08/22/23 0706  Fri Aug 22, 2023  9952 Chest pain Differential diagnoses and workup considered  Presentation is most consistent with muscle strain/spasm of the left shoulder girdle musculature.  Given patient's age and past medical history, cardiac workup was obtained.  EKG without acute ischemic changes.  No dysrhythmia or blocks.  No evidence of pericarditis.  Troponin day 2 of constant pain negative.  Feel the suspicion to rule out ACS in his clinical picture.  CBC without leukocytosis or anemia.  BMP without significant electrolyte derangements or renal insufficiency.  Chest x-ray without evidence of pneumonia, pneumothorax, pulmonary edema pleural effusions.  Presentation is not classic for aortic dissection or esophageal perforation.  Low suspicion  for pulmonary embolism. [PC]    Clinical Course User Index [PC] Ivaan Liddy, Likes Moder, MD    Final Clinical Impression(s) / ED Diagnoses Final diagnoses:  Muscle strain of left shoulder region, initial encounter   The patient appears reasonably screened and/or stabilized for discharge and I doubt any other medical condition or other [facility] requiring further screening, evaluation, or treatment in the ED at this time. I have discussed the findings, Dx and Tx plan with the patient/family who expressed understanding and agree(s) with the plan. Discharge instructions discussed at length. The patient/family was given strict return precautions who verbalized understanding of the instructions. No further questions at time of discharge.  Disposition: Discharge  Condition: Good  ED Discharge Orders     None         Follow Up: Duanne Butler DASEN, MD [address] [place] [address] [phone]  Call  to schedule an appointment for close follow up  [facility] A DEPT OF [facility] [address] [place]  [phone] Call  as needed    This chart was dictated using voice recognition software.  Despite best efforts to proofread,  errors can occur which can change the documentation meaning.    Trine Likes Moder, MD 08/22/23 [phone]

## 2023-08-27 ENCOUNTER — Other Ambulatory Visit: Payer: Self-pay | Admitting: Family Medicine

## 2023-11-20 ENCOUNTER — Encounter: Payer: Self-pay | Admitting: Cardiology

## 2023-11-20 ENCOUNTER — Ambulatory Visit: Payer: BLUE CROSS/BLUE SHIELD | Attending: Cardiology | Admitting: Cardiology

## 2023-11-20 VITALS — BP 142/72 | HR 67 | Resp 16 | Ht 64.0 in | Wt 293.0 lb

## 2023-11-20 DIAGNOSIS — I10 Essential (primary) hypertension: Secondary | ICD-10-CM | POA: Insufficient documentation

## 2023-11-20 DIAGNOSIS — R072 Precordial pain: Secondary | ICD-10-CM | POA: Diagnosis present

## 2023-11-20 DIAGNOSIS — R002 Palpitations: Secondary | ICD-10-CM | POA: Diagnosis present

## 2023-11-20 DIAGNOSIS — G4733 Obstructive sleep apnea (adult) (pediatric): Secondary | ICD-10-CM | POA: Insufficient documentation

## 2023-11-20 DIAGNOSIS — E782 Mixed hyperlipidemia: Secondary | ICD-10-CM | POA: Insufficient documentation

## 2023-11-20 DIAGNOSIS — R0609 Other forms of dyspnea: Secondary | ICD-10-CM | POA: Diagnosis present

## 2023-11-20 MED ORDER — ROSUVASTATIN CALCIUM 20 MG PO TABS: 20.0000 mg | ORAL_TABLET | Freq: Every day | ORAL | 3 refills | Status: AC

## 2023-11-20 MED ORDER — RAMIPRIL 10 MG PO CAPS
10.0000 mg | ORAL_CAPSULE | Freq: Every day | ORAL | 3 refills | Status: AC
Start: 2023-11-20 — End: ?

## 2023-11-20 NOTE — Patient Instructions (Signed)
 Medication Instructions:  INCREASE Ramipril  to 10 mg INCREASE Crestor  to 20 mg    *If you need a refill on your cardiac medications before your next appointment, please call your pharmacy*  Lab Work in 2 months : Bmp  Lipid panel   If you have labs (blood work) drawn today and your tests are completely normal, you will receive your results only by: MyChart Message (if you have MyChart) OR A paper copy in the mail If you have any lab test that is abnormal or we need to change your treatment, we will call you to review the results.  Testing/Procedures: Echo  Your physician has requested that you have an echocardiogram. Echocardiography is a painless test that uses sound waves to create images of your heart. It provides your doctor with information about the size and shape of your heart and how well your heart's chambers and valves are working. This procedure takes approximately one hour. There are no restrictions for this procedure. Please do NOT wear cologne, perfume, aftershave, or lotions (deodorant is allowed). Please arrive 15 minutes prior to your appointment time.  Please note: We ask at that you not bring children with you during ultrasound (echo/ vascular) testing. Due to room size and safety concerns, children are not allowed in the ultrasound rooms during exams. Our front office staff cannot provide observation of children in our lobby area while testing is being conducted. An adult accompanying a patient to their appointment will only be allowed in the ultrasound room at the discretion of the ultrasound technician under special circumstances. We apologize for any inconvenience.  30 day monitor   Your physician has recommended that you wear an event monitor. Event monitors are medical devices that record the heart's electrical activity. Doctors most often us  these monitors to diagnose arrhythmias. Arrhythmias are problems with the speed or rhythm of the heartbeat. The monitor is a  small, portable device. You can wear one while you do your normal daily activities. This is usually used to diagnose what is causing palpitations/syncope (passing out).   REFERRAL TO SLEEP CENTER   PET/CT  How to Prepare for Your Cardiac PET/CT Stress Test:  1. Please do not take these medications before your test:  ~Medications that may interfere with the cardiac pharmacological stress agent (ex. nitrates - including erectile dysfunction medications, isosorbide mononitrate, tamulosin or beta-blockers) the day of the exam. (Erectile dysfunction medication should be held for at least 72 hrs prior to test) ~Theophylline containing medications for 12 hours. ~Dipyridamole 48 hours prior to the test. ~Your remaining medications may be taken with water .  2. Nothing to eat or drink, except water , 3 hours prior to arrival time.   ~ NO caffeine/decaffeinated products, or chocolate 12 hours prior to arrival.  3. NO perfume, cologne or lotion on chest or abdomen area.         - FEMALES - Please avoid wearing dresses to this appointment.  4. Total time is 1 to 2 hours; you may want to bring reading material for the waiting time.  Please report to Radiology at the [facility] Main Entrance 30 minutes early for your test. [address] [place], [place] [address]  Diabetic Preparation: - Hold oral medications. - You may take NPH and Lantus insulin. - Do not take Humalog or Humulin R (Regular Insulin) the day of your test. - Check blood sugars prior to leaving the house. - If able to eat breakfast prior to 3 hour fasting, you may  take all medications, including your insulin, - Do not worry if you miss your breakfast dose of insulin - start at your next meal. - Patients who wear a continuous glucose monitor MUST remove the device prior to scanning.  IF YOU THINK YOU MAY BE PREGNANT, OR ARE NURSING PLEASE INFORM THE TECHNOLOGIST.  In preparation for your appointment, medication and  supplies will be purchased.  Appointment availability is limited, so if you need to cancel or reschedule, please call the Radiology Department at [phone] Maryan Smalling) OR [phone] [facility])  24 hours in advance to avoid a cancellation fee of $100.00  What to Expect After you Arrive:  Once you arrive and check in for your appointment, you will be taken to a preparation room within the Radiology Department.  A technologist or Nurse will obtain your medical history, verify that you are correctly prepped for the exam, and explain the procedure.  Afterwards,  an IV will be started in your arm and electrodes will be placed on your skin for EKG monitoring during the stress portion of the exam. Then you will be escorted to the PET/CT scanner.  There, staff will get you positioned on the scanner and obtain a blood pressure and EKG.  During the exam, you will continue to be connected to the EKG and blood pressure machines.  A small, safe amount of a radioactive tracer will be injected in your IV to obtain a series of pictures of your heart along with an injection of a stress agent.    After your Exam:  It is recommended that you eat a meal and drink a caffeinated beverage to counter act any effects of the stress agent.  Drink plenty of fluids for the remainder of the day and urinate frequently for the first couple of hours after the exam.  Your doctor will inform you of your test results within 7-10 business days.  For more information and frequently asked questions, please visit our website : [email or web address]  For questions about your test or how to prepare for your test, please call: Cardiac Imaging Nurse Navigators Office: [phone]    Follow-Up: At [facility], you and your health needs are our priority.  As part of our continuing mission to provide you with exceptional heart care, our providers are all part of one team.  This team includes your primary Cardiologist  (physician) and Advanced Practice Providers or APPs (Physician Assistants and Nurse Practitioners) who all work together to provide you with the care you need, when you need it.  Your next appointment:   3 month(s)  Provider:   One of our Advanced Practice Providers (APPs): Melita Springer, PA-C  Friddie Jetty, NP Evaline Hill, NP  Theotis Flake, PA-C Lawana Pray, NP  Willis Harter, PA-C Lovette Rud, PA-C  [place], [place] Charles Connor, NP  Marlana Silvan, NP Marcie Sever, PA-C  Laquita Plant, PA-C    Dayna Dunn, PA-C  Marlyse Single, PA-C Palmer Bobo, NP Katlyn West, NP Callie Goodrich, PA-C  Evan Williams, PA-C Sheng Haley, PA-C  Xika Zhao, NP Kathleen Johnson, PA-C

## 2023-11-20 NOTE — Progress Notes (Signed)
 Cardiology Office Note:  .   Date:  11/20/2023  ID:  Jessica [name], DOB 1955-09-17, MRN [id number] PCP: Austine Lefort, MD  [facility] Providers Cardiologist:  Fransico Ivy, MD PCP: Austine Lefort, MD  Chief Complaint  Patient presents with   Establish Care   Chest Pain     Jessica [name] is a 68 y.o. female with hypertension, hyperlipidemia, OSA, venous insufficiency, with chest pain  Discussed the use of AI scribe software for clinical note transcription with the patient, who gave verbal consent to proceed.  History of Present Illness  Patient is here with her husband today.  She is under a lot of stress with regards to caring for her 29 year old mother.  She recently had urgent care visits with complaints of chest/back/left arm discomfort, along with sensation of heart racing and feeling flushed and short of breath.  Blood pressure and lipids are not well-controlled.  She has had a diagnosis of OSA, but currently not using CPAP machine.       Vitals:   11/20/23 1124 11/20/23 1128  BP: (!) 162/94 (!) 142/72  Pulse: 71 67  Resp: 16   SpO2: 95%       Review of Systems  Cardiovascular:  Positive for chest pain, dyspnea on exertion and palpitations. Negative for leg swelling and syncope.        Studies Reviewed: Aaron Aas        EKG 11/20/2023: Normal sinus rhythm Anterior infarct (cited on or before 21-Aug-2023) When compared with ECG of 21-Aug-2023 16:05, Questionable change in QRS axis   Independently interpreted 03/2023: Chol 195, TG 101, HDL 43, LDL 132  08/2023 Hb 12.5  Cr 0.8      Physical Exam Vitals and nursing note reviewed.  Constitutional:      General: She is not in acute distress. Neck:     Vascular: No JVD.  Cardiovascular:     Rate and Rhythm: Normal rate and regular rhythm.     Heart sounds: Normal heart sounds. No murmur heard. Pulmonary:     Effort: Pulmonary effort is normal.     Breath sounds: Normal breath  sounds. No wheezing or rales.  Musculoskeletal:     Right lower leg: Edema (Chronic nonpitting edema with skin discoloration) present.     Left lower leg: Edema (Chronic nonpitting edema with skin discoloration) present.      VISIT DIAGNOSES:   ICD-10-CM   1. Precordial pain  R07.2 EKG 12-Lead    ECHOCARDIOGRAM COMPLETE    NM PET CT CARDIAC PERFUSION MULTI W/ABSOLUTE BLOODFLOW    2. Primary hypertension  I10 ramipril  (ALTACE ) 10 MG capsule    Basic metabolic panel with GFR    3. Palpitations  R00.2 Cardiac event monitor    4. Mixed hyperlipidemia  E78.2 Lipid panel    5. Obstructive sleep apnea  G47.33 Ambulatory referral to Sleep Studies       Jessica [name] is a 68 y.o. female with hypertension, hyperlipidemia, OSA, venous insufficiency, with chest pain  Assessment & Plan Palpitations Intermittent palpitations with episodes of heart racing.  Recommend echocardiogram and 30-day event monitor.   Chest/arm/back pain: Atypical symptoms for CAD, but has risk factors with hypertension and hyperlipidemia. Recommend echocardiogram and PET/CT stress test.  Given her obesity, PET/CT stress test will be more specific than a nuclear stress test alone. specific than nuclear stress test alone.   Hypertension Uncontrolled.  Increase ramipril  from 5 mg daily to 10 mg daily.  Recommend low-salt diet.   Check BMP with lipid panel check in 2 months.  Mixed hyperlipidemia Lipids not well-controlled on Crestor  10 mg daily.  Increased to 20 mg daily. Check lipid panel in 2 months.    Obstructive Sleep Apnea Known OSA, currently not treated.  Repeat sleep study and CPAP.      Informed Consent   Shared Decision Making/Informed Consent The risks [chest pain, shortness of breath, cardiac arrhythmias, dizziness, blood pressure fluctuations, myocardial infarction, stroke/transient ischemic attack, nausea, vomiting, allergic reaction, radiation exposure, metallic taste sensation and  life-threatening complications (estimated to be 1 in 10,000)], benefits (risk stratification, diagnosing coronary artery disease, treatment guidance) and alternatives of a cardiac PET stress test were discussed in detail with Ms. Jividen and she agrees to proceed.       Meds ordered this encounter  Medications   ramipril  (ALTACE ) 10 MG capsule    Sig: Take 1 capsule (10 mg total) by mouth daily.    Dispense:  90 capsule    Refill:  3   rosuvastatin  (CRESTOR ) 20 MG tablet    Sig: Take 1 tablet (20 mg total) by mouth daily.    Dispense:  90 tablet    Refill:  3    F/u in  3 months  Signed, Cody Das, MD

## 2023-12-03 ENCOUNTER — Other Ambulatory Visit: Payer: Self-pay

## 2023-12-03 ENCOUNTER — Encounter: Payer: Self-pay | Admitting: Family Medicine

## 2023-12-03 DIAGNOSIS — J209 Acute bronchitis, unspecified: Secondary | ICD-10-CM

## 2023-12-03 DIAGNOSIS — J069 Acute upper respiratory infection, unspecified: Secondary | ICD-10-CM

## 2023-12-03 MED ORDER — ALBUTEROL SULFATE HFA 108 (90 BASE) MCG/ACT IN AERS: 2.0000 | INHALATION_SPRAY | RESPIRATORY_TRACT | 2 refills | Status: AC | PRN

## 2023-12-30 ENCOUNTER — Ambulatory Visit: Payer: Self-pay | Admitting: Cardiology

## 2023-12-30 ENCOUNTER — Ambulatory Visit (HOSPITAL_COMMUNITY)
Admission: RE | Admit: 2023-12-30 | Discharge: 2023-12-30 | Disposition: A | Discharge status: Home / Self Care | Source: Ambulatory Visit | Attending: Cardiovascular Disease | Admitting: Cardiovascular Disease

## 2023-12-30 DIAGNOSIS — R072 Precordial pain: Secondary | ICD-10-CM | POA: Diagnosis present

## 2023-12-30 LAB — ECHOCARDIOGRAM COMPLETE
AR max vel: 3.67 cm2
AV Area VTI: 3.91 cm2
AV Area mean vel: 3.68 cm2
AV Mean grad: 6 mmHg
AV Peak grad: 10.9 mmHg
Ao pk vel: 1.65 m/s
Area-P 1/2: 3.39 cm2
S' Lateral: 2.2 cm

## 2024-01-09 ENCOUNTER — Ambulatory Visit: Attending: Cardiology

## 2024-01-09 DIAGNOSIS — R002 Palpitations: Secondary | ICD-10-CM

## 2024-01-14 DIAGNOSIS — R002 Palpitations: Secondary | ICD-10-CM | POA: Diagnosis not present

## 2024-01-20 ENCOUNTER — Institutional Professional Consult (permissible substitution): Admitting: Neurology

## 2024-01-30 LAB — BASIC METABOLIC PANEL WITH GFR
BUN/Creatinine Ratio: 17 (ref 12–28)
BUN: 15 mg/dL (ref 8–27)
CO2: 22 mmol/L (ref 20–29)
Calcium: 9.3 mg/dL (ref 8.7–10.3)
Chloride: 104 mmol/L (ref 96–106)
Creatinine, Ser: 0.87 mg/dL (ref 0.57–1.00)
Glucose: 104 mg/dL — ABNORMAL HIGH (ref 70–99)
Potassium: 4.9 mmol/L (ref 3.5–5.2)
Sodium: 143 mmol/L (ref 134–144)
eGFR: 72 mL/min/1.73 (ref >59)

## 2024-01-30 LAB — LIPID PANEL
Chol/HDL Ratio: 2.6 ratio (ref 0.0–4.4)
Cholesterol, Total: 116 mg/dL (ref 100–199)
HDL: 44 mg/dL (ref >39)
LDL Chol Calc (NIH): 59 mg/dL (ref 0–99)
Triglycerides: 56 mg/dL (ref 0–149)
VLDL Cholesterol Cal: 13 mg/dL (ref 5–40)

## 2024-02-04 ENCOUNTER — Encounter: Payer: Self-pay | Admitting: Neurology

## 2024-02-04 ENCOUNTER — Ambulatory Visit (INDEPENDENT_AMBULATORY_CARE_PROVIDER_SITE_OTHER): Admitting: Neurology

## 2024-02-04 VITALS — BP 156/72 | HR 72 | Ht 63.0 in | Wt 291.8 lb

## 2024-02-04 DIAGNOSIS — Z6841 Body Mass Index (BMI) 40.0 and over, adult: Secondary | ICD-10-CM

## 2024-02-04 DIAGNOSIS — G4733 Obstructive sleep apnea (adult) (pediatric): Secondary | ICD-10-CM | POA: Diagnosis not present

## 2024-02-04 DIAGNOSIS — R079 Chest pain, unspecified: Secondary | ICD-10-CM

## 2024-02-04 DIAGNOSIS — R635 Abnormal weight gain: Secondary | ICD-10-CM | POA: Diagnosis not present

## 2024-02-04 DIAGNOSIS — G4719 Other hypersomnia: Secondary | ICD-10-CM

## 2024-02-04 DIAGNOSIS — Z820 Family history of epilepsy and other diseases of the nervous system: Secondary | ICD-10-CM | POA: Diagnosis not present

## 2024-02-04 DIAGNOSIS — R600 Localized edema: Secondary | ICD-10-CM

## 2024-02-04 DIAGNOSIS — R351 Nocturia: Secondary | ICD-10-CM | POA: Diagnosis not present

## 2024-02-04 NOTE — Patient Instructions (Signed)

## 2024-02-04 NOTE — Progress Notes (Signed)
 Subjective:    Patient ID: Jessica [name] is a 68 y.o. female.  HPI    True Mar, MD, PhD [facility] [address] [address] [place], [place] [address]  Dear Dr. Elmira,  I saw your patient, Jessica [name], upon your kind request in my sleep clinic today for initial consultation of her sleep disorder, in particular, evaluation of her prior diagnosis of obstructive sleep apnea.  The patient is unaccompanied today.  As you know, Jessica [name] is a 68 year old female with an underlying medical history of hypertension, hyperlipidemia, palpitations, chest pain, exertional dyspnea, anemia, arthritis, asthma, carpal tunnel syndrome, chronic venous insufficiency, reflux disease, hiatal hernia, history of pneumonia, and morbid obesity with a BMI of over 50, who was previously diagnosed with obstructive sleep apnea and placed on PAP therapy.  Her Epworth sleepiness score is 18 out of 24, fatigue severity score is 58 out of 63.  She reports that she had trouble tolerating Pap therapy at the time, testing was about 25 years ago when she did not notice benefit from treatment and let it go after about 2 to 3 months.  She would be willing to get retested and consider PAP therapy again.  She has had weight gain over time.  She lives with her husband, her oldest son lives with them.  She has 3 grown sons.  She is retired, she was an Print production planner for a [employer].  She quit smoking over 50 years ago, she drinks quite a bit of caffeine in the form of diet orange soda with caffeine, about 2 L/day.  She has struggled with lower extremity edema, she has struggled with stress, sadly, she lost her mom about 2 weeks ago.  She has a TV in her bedroom but does not watch it at night, she has no pets in the household.  She has nocturia about once or twice per average night, denies recurrent nocturnal or morning headaches.  Her twin brother has sleep apnea.  Bedtime is around 11 and rise time  between 7:30 AM and 9 AM.  She does take a nap nearly daily, and it can be prolonged, up to 2 to 3 hours long. I reviewed your office note from 11/20/2023.    Her Past Medical History Is Significant For: Past Medical History:  Diagnosis Date   Anemia    per patient, had it years ago before hysterectomy years ago   Arthritis    Asthma    Bronchitis    Carpal tunnel syndrome of right wrist    Chronic venous insufficiency    GERD (gastroesophageal reflux disease)    History of hiatal hernia    Hypertension    Pneumonia    PONV (postoperative nausea and vomiting)     Her Past Surgical History Is Significant For: Past Surgical History:  Procedure Laterality Date   ABDOMINAL HYSTERECTOMY     CARPAL TUNNEL RELEASE Right 12/16/2019   Procedure: RIGHT CARPAL TUNNEL RELEASE;  Surgeon: Addie Cordella Hamilton, MD;  Location: [facility] OR;  Service: Orthopedics;  Laterality: Right;   COLONOSCOPY  06/05/2012   Procedure: COLONOSCOPY;  Surgeon: Claudis RAYMOND Rivet, MD;  Location: [facility] SUITE;  Service: Endoscopy;  Laterality: N/A;  125-changed to 1225 Ann to notify pt   NISSEN FUNDOPLICATION     x2   SHOULDER ARTHROSCOPY WITH SUBACROMIAL DECOMPRESSION, ROTATOR CUFF REPAIR AND BICEP TENDON REPAIR Right 12/16/2019   Procedure: RIGHT SHOULDER ARTHROSCOPY, BICEPS TENODESIS, MINI OPEN ROTATOR CUFF TEAR  REPAIR, DEBRIDEMENT;  Surgeon: Addie Cordella Hamilton, MD;  Location: [facility] OR;  Service: Orthopedics;  Laterality: Right;    Her Family History Is Significant For: Family History  Problem Relation Age of Onset   Hypertension Mother    Alzheimer's disease Father    Sleep apnea Brother     Her Social History Is Significant For: Social History   Socioeconomic History   Marital status: Married    Spouse name: Not on file   Number of children: Not on file   Years of education: Not on file   Highest education level: Not on file  Occupational History   Not on file  Tobacco Use   Smoking status: Former    Types:  Cigarettes   Smokeless tobacco: Never  Vaping Use   Vaping status: Never Used  Substance and Sexual Activity   Alcohol use: No   Drug use: No   Sexual activity: Not Currently  Other Topics Concern   Not on file  Social History Narrative   Caffiene 1-2 cups daily, diet sundrop 2 liter   Working retired 2017   Lives husband, 1 grown son at home.    Social Drivers of Corporate investment banker Strain: Not on file  Food Insecurity: Not on file  Transportation Needs: Not on file  Physical Activity: Not on file  Stress: Not on file  Social Connections: Not on file    Her Allergies Are:  Allergies  Allergen Reactions   Codeine Other (See Comments)    Severe headaches    Tetanus Antitoxin Swelling and Other (See Comments)    Fever    Tetanus Toxoids Swelling and Other (See Comments)    fever   Penicillins Rash  :   Her Current Medications Are:  Outpatient Encounter Medications as of 02/04/2024  Medication Sig   acetaminophen  (TYLENOL  8 HOUR ARTHRITIS PAIN) 650 MG CR tablet    albuterol  (VENTOLIN  HFA) 108 (90 Base) MCG/ACT inhaler Inhale 2 puffs into the lungs every 4 (four) hours as needed for wheezing or shortness of breath.   cetirizine (ZYRTEC) 10 MG tablet Take 10 mg by mouth daily as needed (allergies.).    esomeprazole  (NEXIUM ) 20 MG capsule Take 1 capsule (20 mg total) by mouth daily with supper.   furosemide  (LASIX ) 40 MG tablet Take 1 tablet (40 mg total) by mouth daily.   gabapentin  (NEURONTIN ) 300 MG capsule Take 1 capsule (300 mg total) by mouth 3 (three) times daily.   metroNIDAZOLE  (METROGEL ) 1 % gel Apply topically daily.   naproxen  sodium (ANAPROX ) 220 MG tablet Take 220-440 mg by mouth 3 (three) times daily as needed (pain. (Max 3 tablets/24 hrs.)).    ramipril  (ALTACE ) 10 MG capsule Take 1 capsule (10 mg total) by mouth daily.   rosuvastatin  (CRESTOR ) 20 MG tablet Take 1 tablet (20 mg total) by mouth daily.   No facility-administered encounter  medications on file as of 02/04/2024.  :   Review of Systems:  Out of a complete 14 point review of systems, all are reviewed and negative with the exception of these symptoms as listed below:  Review of Systems  Neurological:        Diagnosed OSA 25 + years ago, tried cpap 60-90 days no diiference in how she felt.  Follow up was not good she said.  Had [facility] in [place]. Snores, gasps awake at times. No witness apnea. Daytime fatigue.  ESS 18, FSS 58.      Objective:  Neurological Exam  Physical Exam Physical Examination:   Vitals:   02/04/24 1114  BP: (!) 156/72  Pulse: 72  SpO2: 98%    General Examination: The patient is a very pleasant 68 y.o. female in no acute distress. She appears well-developed and well-nourished and well groomed.   HEENT: Normocephalic, atraumatic, pupils are equal, round and reactive to light, extraocular tracking is good without limitation to gaze excursion or nystagmus noted. Hearing is grossly intact. Face is symmetric with normal facial animation. Speech is clear with no dysarthria noted. There is no hypophonia. There is no lip, neck/head, jaw or voice tremor. Neck is supple with full range of passive and active motion. There are no carotid bruits on auscultation. Oropharynx exam reveals: mild mouth dryness, adequate dental hygiene and moderate airway crowding, due to small airway entry, tonsils on the small side, neck circumference 18 1/4 inches, Mallampati class II.  Tongue protrudes centrally and palate elevates symmetrically.    Chest: Clear to auscultation without wheezing, rhonchi or crackles noted.  Heart: S1+S2+0, regular and normal without murmurs, rubs or gallops noted.   Abdomen: Soft, non-tender and non-distended.  Extremities: There is significant swelling in both lower extremities distally with chronic appearing changes in the skin, older scars, discoloration and lichenification of her skin.   Skin: Warm and dry without  trophic changes noted.   Musculoskeletal: exam reveals no obvious joint deformities.   Neurologically:  Mental status: The patient is awake, alert and oriented in all 4 spheres. Her immediate and remote memory, attention, language skills and fund of knowledge are appropriate. There is no evidence of aphasia, agnosia, apraxia or anomia. Speech is clear with normal prosody and enunciation. Thought process is linear. Mood is normal and affect is normal.  Cranial nerves II - XII are as described above under HEENT exam.  Motor exam: Normal bulk, strength and tone is noted. There is no obvious action or resting tremor.  Fine motor skills and coordination: grossly intact.  Cerebellar testing: No dysmetria or intention tremor. There is no truncal or gait ataxia.  Sensory exam: intact to light touch in the upper and lower extremities.  Gait, station and balance: She stands slowly, does not walk with a walking aid.  She walks slowly.   Assessment and Plan:  In summary, PAMI WOOL is a very pleasant 68 y.o.-year old female with an underlying medical history of hypertension, hyperlipidemia, palpitations, chest pain, exertional dyspnea, anemia, arthritis, asthma, carpal tunnel syndrome, chronic venous insufficiency, reflux disease, hiatal hernia, history of pneumonia, and morbid obesity with a BMI of over 50, who presents for evaluation of her prior diagnosis of obstructive sleep apnea.  A laboratory attended sleep study is typically considered gold standard for evaluation of sleep disordered breathing.   I had a long chat with the patient about my findings and the diagnosis of sleep apnea, particularly OSA, its prognosis and treatment options. We talked about medical/conservative treatments, surgical interventions and non-pharmacological approaches for symptom control. I explained, in particular, the risks and ramifications of untreated moderate to severe OSA, especially with respect to developing  cardiovascular disease down the road, including congestive heart failure (CHF), difficult to treat hypertension, cardiac arrhythmias (particularly A-fib), neurovascular complications including TIA, stroke and dementia. Even type 2 diabetes has, in part, been linked to untreated OSA. Symptoms of untreated OSA may include (but may not be limited to) daytime sleepiness, nocturia (i.e. frequent nighttime urination), memory problems, mood irritability and suboptimally controlled or worsening mood disorder such  as depression and/or anxiety, lack of energy, lack of motivation, physical discomfort, as well as recurrent headaches, especially morning or nocturnal headaches. We talked about the importance of maintaining a healthy lifestyle and striving for healthy weight.  In addition, we talked about the importance of striving for and maintaining good sleep hygiene. I recommended a sleep study at this time. I outlined the differences between a laboratory attended sleep study which is considered more comprehensive and accurate over the option of a home sleep test (HST); the latter may lead to underestimation of sleep disordered breathing in some instances and does not help with diagnosing upper airway resistance syndrome and is not accurate enough to diagnose primary central sleep apnea typically. I outlined possible surgical and non-surgical treatment options of OSA, including the use of a positive airway pressure (PAP) device (i.e. CPAP, AutoPAP/APAP or BiPAP in certain circumstances), a custom-made dental device (aka oral appliance, which would require a referral to a specialist dentist or orthodontist typically, and is generally speaking not considered for patients with full dentures or edentulous state), upper airway surgical options, such as traditional UPPP (which is not considered a first-line treatment) or the Inspire device (hypoglossal nerve stimulator, which would involve a referral for consultation with an ENT  surgeon, after careful selection, following inclusion criteria - also not first-line treatment). I explained the PAP treatment option to the patient in detail, as this is generally considered first-line treatment.  The patient indicated that she would be willing to try PAP therapy again, if the need arises. I explained the importance of being compliant with PAP treatment, not only for insurance purposes but primarily to improve patient's symptoms symptoms, and for the patient's long term health benefit, including to reduce Her cardiovascular risks longer-term.    We will pick up our discussion about the next steps and treatment options after testing.  We will keep her posted as to the test results by phone call and/or MyChart messaging where possible.  We will plan to follow-up in sleep clinic accordingly as well.  I answered all her questions today and the patient was in agreement.   I encouraged her to call with any interim questions, concerns, problems or updates or email us  through MyChart.  Generally speaking, sleep test authorizations may take up to 2 weeks, sometimes less, sometimes longer, the patient is encouraged to get in touch with us  if they do not hear back from the sleep lab staff directly within the next 2 weeks.  Thank you very much for allowing me to participate in the care of this nice patient. If I can be of any further assistance to you please do not hesitate to call me at [phone].  Sincerely,   True Mar, MD, PhD

## 2024-02-12 ENCOUNTER — Telehealth: Payer: Self-pay | Admitting: Neurology

## 2024-02-12 NOTE — Telephone Encounter (Signed)
 NPSG- Medicare/BCBS supp no auth req   Patient is scheduled at 03/23/24 at 8 pm.  Mailed packet to the patient.

## 2024-02-16 ENCOUNTER — Encounter (HOSPITAL_COMMUNITY): Payer: Self-pay

## 2024-02-17 ENCOUNTER — Ambulatory Visit
Admission: RE | Admit: 2024-02-17 | Discharge: 2024-02-17 | Disposition: A | Discharge status: Home / Self Care | Source: Ambulatory Visit | Attending: Cardiology | Admitting: Cardiology

## 2024-02-17 DIAGNOSIS — R072 Precordial pain: Secondary | ICD-10-CM | POA: Diagnosis present

## 2024-02-17 DIAGNOSIS — K449 Diaphragmatic hernia without obstruction or gangrene: Secondary | ICD-10-CM | POA: Diagnosis not present

## 2024-02-17 LAB — NM PET CT CARDIAC PERFUSION MULTI W/ABSOLUTE BLOODFLOW
MBFR: 2.01
Nuc Rest EF: 62 %
Nuc Stress EF: 73 %
Peak HR: 82 {beats}/min
Rest HR: 72 {beats}/min
Rest MBF: 1.02 ml/g/min
Rest Nuclear Isotope Dose: 25 mCi
SRS: 2
SSS: 4
ST Depression (mm): 0 mm
Stress MBF: 2.05 ml/g/min
Stress Nuclear Isotope Dose: 25 mCi
TID: 0.94

## 2024-02-17 MED ORDER — RUBIDIUM RB82 GENERATOR (RUBYFILL)
25.0000 | PACK | Freq: Once | INTRAVENOUS | Status: AC
  Administered 2024-02-17: 24.95 via INTRAVENOUS

## 2024-02-17 MED ORDER — RUBIDIUM RB82 GENERATOR (RUBYFILL)
25.0000 | PACK | Freq: Once | INTRAVENOUS | Status: AC
  Administered 2024-02-17: 24.97 via INTRAVENOUS

## 2024-02-17 MED ORDER — REGADENOSON 0.4 MG/5ML IV SOLN
0.4000 mg | Freq: Once | INTRAVENOUS | Status: AC
  Administered 2024-02-17: 0.4 mg via INTRAVENOUS
  Filled 2024-02-17: qty 5

## 2024-02-17 NOTE — Progress Notes (Signed)
 Pt tolerated lexiscan . IV removed and given PO caffeine.

## 2024-02-18 ENCOUNTER — Ambulatory Visit: Admission: RE | Admit: 2024-02-18 | Source: Ambulatory Visit

## 2024-02-18 ENCOUNTER — Inpatient Hospital Stay (HOSPITAL_COMMUNITY): Admission: RE | Admit: 2024-02-18 | Source: Ambulatory Visit

## 2024-02-24 ENCOUNTER — Ambulatory Visit: Attending: Nurse Practitioner | Admitting: Nurse Practitioner

## 2024-02-24 ENCOUNTER — Encounter: Payer: Self-pay | Admitting: Nurse Practitioner

## 2024-02-24 VITALS — BP 132/70 | HR 78 | Ht 63.0 in | Wt 284.8 lb

## 2024-02-24 DIAGNOSIS — R002 Palpitations: Secondary | ICD-10-CM | POA: Diagnosis present

## 2024-02-24 DIAGNOSIS — Z6841 Body Mass Index (BMI) 40.0 and over, adult: Secondary | ICD-10-CM | POA: Diagnosis present

## 2024-02-24 DIAGNOSIS — I2510 Atherosclerotic heart disease of native coronary artery without angina pectoris: Secondary | ICD-10-CM | POA: Diagnosis not present

## 2024-02-24 DIAGNOSIS — G4733 Obstructive sleep apnea (adult) (pediatric): Secondary | ICD-10-CM | POA: Insufficient documentation

## 2024-02-24 DIAGNOSIS — E66813 Obesity, class 3: Secondary | ICD-10-CM | POA: Insufficient documentation

## 2024-02-24 DIAGNOSIS — E785 Hyperlipidemia, unspecified: Secondary | ICD-10-CM | POA: Insufficient documentation

## 2024-02-24 DIAGNOSIS — I10 Essential (primary) hypertension: Secondary | ICD-10-CM | POA: Insufficient documentation

## 2024-02-24 NOTE — Patient Instructions (Signed)
 Medication Instructions:  Your physician recommends that you continue on your current medications as directed. Please refer to the Current Medication list given to you today.  *If you need a refill on your cardiac medications before your next appointment, please call your pharmacy*  Lab Work: NONE ordered at this time of appointment   Testing/Procedures: NONE ordered at this time of appointment   Follow-Up: At [facility], you and your health needs are our priority.  As part of our continuing mission to provide you with exceptional heart care, our providers are all part of one team.  This team includes your primary Cardiologist (physician) and Advanced Practice Providers or APPs (Physician Assistants and Nurse Practitioners) who all work together to provide you with the care you need, when you need it.  Your next appointment:   1 year(s)  Provider:   Newman JINNY Lawrence, MD or Damien Braver, NP          We recommend signing up for the patient portal called MyChart.  Sign up information is provided on this After Visit Summary.  MyChart is used to connect with patients for Virtual Visits (Telemedicine).  Patients are able to view lab/test results, encounter notes, upcoming appointments, etc.  Non-urgent messages can be sent to your provider as well.   To learn more about what you can do with MyChart, go to [email or web address].

## 2024-02-24 NOTE — Progress Notes (Signed)
 Office Visit    Patient Name: Jessica [name] Date of Encounter: 02/24/2024  Primary Care Provider:  Duanne Butler DASEN, MD Primary Cardiologist:  Newman JINNY Lawrence, MD  Chief Complaint    68 year old female with a history of coronary artery calcification, hypertension, hyperlipidemia, chronic venous insufficiency, asthma, OSA, obesity, arthritis, and GERD who presents for follow-up related to CAD.  Past Medical History    Past Medical History:  Diagnosis Date   Anemia    per patient, had it years ago before hysterectomy years ago   Arthritis    Asthma    Bronchitis    Carpal tunnel syndrome of right wrist    Chronic venous insufficiency    GERD (gastroesophageal reflux disease)    History of hiatal hernia    Hypertension    Pneumonia    PONV (postoperative nausea and vomiting)    Past Surgical History:  Procedure Laterality Date   ABDOMINAL HYSTERECTOMY     CARPAL TUNNEL RELEASE Right 12/16/2019   Procedure: RIGHT CARPAL TUNNEL RELEASE;  Surgeon: Addie Cordella Hamilton, MD;  Location: [facility] OR;  Service: Orthopedics;  Laterality: Right;   COLONOSCOPY  06/05/2012   Procedure: COLONOSCOPY;  Surgeon: Claudis RAYMOND Rivet, MD;  Location: [facility];  Service: Endoscopy;  Laterality: N/A;  125-changed to 1225 Ann to notify pt   NISSEN FUNDOPLICATION     x2   SHOULDER ARTHROSCOPY WITH SUBACROMIAL DECOMPRESSION, ROTATOR CUFF REPAIR AND BICEP TENDON REPAIR Right 12/16/2019   Procedure: RIGHT SHOULDER ARTHROSCOPY, BICEPS TENODESIS, MINI OPEN ROTATOR CUFF TEAR REPAIR, DEBRIDEMENT;  Surgeon: Addie Cordella Hamilton, MD;  Location: [facility] OR;  Service: Orthopedics;  Laterality: Right;    Allergies  Allergies  Allergen Reactions   Codeine Other (See Comments)    Severe headaches    Tetanus Antitoxin Swelling and Other (See Comments)    Fever    Tetanus Toxoids Swelling and Other (See Comments)    fever   Penicillins Rash     Labs/Other Studies Reviewed    The following studies were  reviewed today:  Cardiac Studies & Procedures   ______________________________________________________________________________________________   STRESS TESTS  NM PET CT CARDIAC PERFUSION MULTI W/ABSOLUTE BLOODFLOW 02/17/2024  Narrative   LV perfusion is normal. There is no evidence of ischemia. There is no evidence of infarction.   Myocardial blood flow was computed to be 1.93ml/g/min at rest and 2.05ml/g/min at stress. Global myocardial blood flow reserve was 2.01 and was normal.   Coronary calcium  was present on the attenuation correction CT images. Mild coronary calcifications were present. Coronary calcifications were present in the left anterior descending artery distribution(s).   The study is normal. The study is low risk.  Electronically signed by Darryle Decent, MD ________________________________________________________________________________  ERASMO: OVER-READ INTERPRETATION  CT CHEST  The following report is a limited chest CT over-read performed by radiologist Dr. Elsie Ko [facility] on 02/17/2024. This over-read does not include interpretation of cardiac or coronary anatomy or pathology nor does it include evaluation of the PET data. The cardiac PET-CT interpretation by the cardiologist is attached.  COMPARISON:  Chest radiographs 08/21/2023.  Abdominal CT 04/01/2006.  FINDINGS: Mediastinum/Nodes: No enlarged lymph nodes within the visualized mediastinum.Stable moderate-size hiatal hernia with a surgical clip adjacent to the gastroesophageal junction.  Lungs/Pleura: There is no pleural effusion. The visualized lungs appear clear.  Upper abdomen: No significant findings in the visualized upper abdomen.  Musculoskeletal/Chest wall: No chest wall mass or suspicious osseous findings within the visualized chest.  IMPRESSION: 1. No significant extracardiac findings within the visualized chest. 2. Stable moderate-size hiatal  hernia.   Electronically Signed By: Elsie Perone M.D. On: 02/17/2024 10:38   ECHOCARDIOGRAM  ECHOCARDIOGRAM COMPLETE 12/30/2023  Narrative ECHOCARDIOGRAM REPORT    Patient Name:   Jessica [name] Date of Exam: 12/30/2023 Medical Rec #:  [id number]       Height:       64.0 in Accession #:    [id number]      Weight:       293.0 lb Date of Birth:  12-25-1955       BSA:          2.301 m Patient Age:    67 years        BP:           142/72 mmHg Patient Gender: F               HR:           67 bpm. Exam Location:  [address]  Procedure: 2D Echo, 3D Echo, Cardiac Doppler and Color Doppler (Both Spectral and Color Flow Doppler were utilized during procedure).  Indications:    R07.9 Chest Pain  History:        Patient has prior history of Echocardiogram examinations, most recent 05/12/2018. Signs/Symptoms:Chest Pain, Shortness of Breath, Edema and Fatigue; Risk Factors:Hypertension, Dyslipidemia, Family History of Coronary Artery Disease and Sleep Apnea. Palpitations, Asthma, Obesity.  Sonographer:    Heather Hawks RDCS Referring Phys: NEWMAN PARAS PATWARDHAN  IMPRESSIONS   1. Left ventricular ejection fraction, by estimation, is 60 to 65%. The left ventricle has normal function. The left ventricle has no regional wall motion abnormalities. There is mild concentric left ventricular hypertrophy. Left ventricular diastolic parameters are consistent with Grade I diastolic dysfunction (impaired relaxation). 2. Right ventricular systolic function is normal. The right ventricular size is mildly enlarged. 3. The mitral valve is normal in structure. Mild mitral valve regurgitation. No evidence of mitral stenosis. 4. The aortic valve is tricuspid. There is mild calcification of the aortic valve. Aortic valve regurgitation is not visualized. Aortic valve sclerosis/calcification is present, without any evidence of aortic stenosis. 5. The inferior vena cava is normal in size with greater  than 50% respiratory variability, suggesting right atrial pressure of 3 mmHg.  FINDINGS Left Ventricle: Left ventricular ejection fraction, by estimation, is 60 to 65%. The left ventricle has normal function. The left ventricle has no regional wall motion abnormalities. The left ventricular internal cavity size was normal in size. There is mild concentric left ventricular hypertrophy. Left ventricular diastolic parameters are consistent with Grade I diastolic dysfunction (impaired relaxation).  Right Ventricle: The right ventricular size is mildly enlarged. No increase in right ventricular wall thickness. Right ventricular systolic function is normal.  Left Atrium: Left atrial size was normal in size.  Right Atrium: Right atrial size was normal in size.  Pericardium: There is no evidence of pericardial effusion.  Mitral Valve: The mitral valve is normal in structure. Mild mitral valve regurgitation. No evidence of mitral valve stenosis.  Tricuspid Valve: The tricuspid valve is normal in structure. Tricuspid valve regurgitation is mild . No evidence of tricuspid stenosis.  Aortic Valve: The aortic valve is tricuspid. There is mild calcification of the aortic valve. Aortic valve regurgitation is not visualized. Aortic valve sclerosis/calcification is present, without any evidence of aortic stenosis. Aortic valve mean gradient measures 6.0 mmHg. Aortic valve peak gradient measures 10.9 mmHg. Aortic valve area, by  VTI measures 3.91 cm.  Pulmonic Valve: The pulmonic valve was normal in structure. Pulmonic valve regurgitation is trivial. No evidence of pulmonic stenosis.  Aorta: The aortic root is normal in size and structure.  Venous: The inferior vena cava is normal in size with greater than 50% respiratory variability, suggesting right atrial pressure of 3 mmHg.  IAS/Shunts: No atrial level shunt detected by color flow Doppler.  Additional Comments: 3D was performed not requiring image  post processing on an independent workstation and was indeterminate.   LEFT VENTRICLE PLAX 2D LVIDd:         4.05 cm   Diastology LVIDs:         2.20 cm   LV e' medial:    6.85 cm/s LV PW:         1.10 cm   LV E/e' medial:  16.5 LV IVS:        1.05 cm   LV e' lateral:   10.40 cm/s LVOT diam:     2.60 cm   LV E/e' lateral: 10.9 LV SV:         145 LV SV Index:   63 LVOT Area:     5.31 cm  3D Volume EF: 3D EF:        44 % LV EDV:       105 ml LV ESV:       59 ml LV SV:        47 ml  RIGHT VENTRICLE RV Basal diam:  4.30 cm RV Mid diam:    4.00 cm RV S prime:     10.90 cm/s TAPSE (M-mode): 2.3 cm RVSP:           29.4 mmHg  LEFT ATRIUM             Index        RIGHT ATRIUM           Index LA diam:        4.60 cm 2.00 cm/m   RA Pressure: 3.00 mmHg LA Vol (A2C):   75.1 ml 32.64 ml/m  RA Area:     19.50 cm LA Vol (A4C):   60.5 ml 26.29 ml/m  RA Volume:   58.10 ml  25.25 ml/m LA Biplane Vol: 68.3 ml 29.68 ml/m AORTIC VALVE AV Area (Vmax):    3.67 cm AV Area (Vmean):   3.68 cm AV Area (VTI):     3.91 cm AV Vmax:           165.00 cm/s AV Vmean:          111.000 cm/s AV VTI:            0.371 m AV Peak Grad:      10.9 mmHg AV Mean Grad:      6.0 mmHg LVOT Vmax:         114.00 cm/s LVOT Vmean:        77.000 cm/s LVOT VTI:          0.273 m LVOT/AV VTI ratio: 0.74  AORTA Ao Root diam: 2.80 cm Ao Asc diam:  3.40 cm  MITRAL VALVE                TRICUSPID VALVE MV Area (PHT): cm          TR Peak grad:   26.4 mmHg MV Decel Time: 224 msec     TR Vmax:        257.00 cm/s MV E velocity: 113.00  cm/s  Estimated RAP:  3.00 mmHg MV A velocity: 110.50 cm/s  RVSP:           29.4 mmHg MV E/A ratio:  1.02 SHUNTS Systemic VTI:  0.27 m Systemic Diam: 2.60 cm  Toribio Fuel MD Electronically signed by Toribio Fuel MD Signature Date/Time: 12/30/2023/11:05:26 AM    Final    MONITORS  CARDIAC EVENT MONITOR 01/09/2024  Narrative Mobile cardiac telemetry 30 days  12/09/2023 - 01/07/2024: Dominant rhythm: Sinus. HR 47-148 bpm. Avg HR 78 bpm. <1% PACs, 1% PVCs. No atrial fibrillation/atrial flutter/SVT/VT/high grade AV block, sinus pause >3sec noted. 4 patient triggered events, 16 auto triggered events, no significant arrhythmia noted.       ______________________________________________________________________________________________     Recent Labs: 03/21/2023: ALT 16 08/21/2023: Hemoglobin 12.8; Platelets 180 01/29/2024: BUN 15; Creatinine, Ser 0.87; Potassium 4.9; Sodium 143  Recent Lipid Panel    Component Value Date/Time   CHOL 116 01/29/2024 1127   TRIG 56 01/29/2024 1127   HDL 44 01/29/2024 1127   CHOLHDL 2.6 01/29/2024 1127   CHOLHDL 4.5 03/21/2023 1440   LDLCALC 59 01/29/2024 1127   LDLCALC 132 (H) 03/21/2023 1440    History of Present Illness    68 year old female with the above past medical history including coronary artery calcification, hypertension, hyperlipidemia, chronic venous insufficiency, asthma, OSA, obesity, arthritis, and GERD.  Echocardiogram in 2019 showed EF 55 to 60%, no RWMA, G2 DD, normal RV systolic function, no significant valvular abnormalities.  She established with cardiology in 11/2023.  She was last seen in the office on 11/20/2023 and reported intermittent palpitations, intermittent chest discomfort, left arm discomfort. Repeat echocardiogram in 12/2023 showed EF 60 to 65%, normal LV function, no RWMA, mild concentric LVH, G1 DD, normal RV systolic function, mild mitral valve regurgitation. Cardiac monitor in 12/2023 showed predominantly normal sinus rhythm, rare PACs and PVCs, no significant arrhythmia. Ramipril  was increased to 10 mg daily. Crestor  was increased to 20 mg daily. Cardiac PET stress test in 02/2024 showed no evidence of ischemia, there was evidence of coronary calcification.  She presents today for follow-up accompanied by her husband.  Since her last visit been stable from a cardiac standpoint. She  denies symptoms concerning for angina. Denies any significant palpitations.  Her mother passed away in 03/03/2024. She has been under a significant amount of stress in the setting.   Home Medications    Current Outpatient Medications  Medication Sig Dispense Refill   acetaminophen  (TYLENOL  8 HOUR ARTHRITIS PAIN) 650 MG CR tablet      albuterol  (VENTOLIN  HFA) 108 (90 Base) MCG/ACT inhaler Inhale 2 puffs into the lungs every 4 (four) hours as needed for wheezing or shortness of breath. 18 g 2   cetirizine (ZYRTEC) 10 MG tablet Take 10 mg by mouth daily as needed (allergies.).      esomeprazole  (NEXIUM ) 20 MG capsule Take 1 capsule (20 mg total) by mouth daily with supper. 90 capsule 1   furosemide  (LASIX ) 40 MG tablet Take 1 tablet (40 mg total) by mouth daily. 90 tablet 1   gabapentin  (NEURONTIN ) 300 MG capsule Take 1 capsule (300 mg total) by mouth 3 (three) times daily. (Patient taking differently: Take 300 mg by mouth 3 (three) times daily as needed.) 90 capsule 3   metroNIDAZOLE  (METROGEL ) 1 % gel Apply topically daily. 45 g 11   naproxen  sodium (ANAPROX ) 220 MG tablet Take 220-440 mg by mouth 3 (three) times daily as needed (pain. (Max 3 tablets/24 hrs.)).  ramipril  (ALTACE ) 10 MG capsule Take 1 capsule (10 mg total) by mouth daily. 90 capsule 3   rosuvastatin  (CRESTOR ) 20 MG tablet Take 1 tablet (20 mg total) by mouth daily. 90 tablet 3   No current facility-administered medications for this visit.     Review of Systems    She denies chest pain, palpitations, dyspnea, pnd, orthopnea, n, v, dizziness, syncope, edema, weight gain, or early satiety. All other systems reviewed and are otherwise negative except as noted above.   Physical Exam    VS:  BP 132/70 (BP Location: Left Arm, Patient Position: Sitting, Cuff Size: Large)   Pulse 78   Ht 5' 3 (1.6 m)   Wt 284 lb 12.8 oz (129.2 kg)   SpO2 98%   BMI 50.45 kg/m   GEN: Well nourished, well developed, in no acute  distress. HEENT: normal. Neck: Supple, no JVD, carotid bruits, or masses. Cardiac: RRR, no murmurs, rubs, or gallops. No clubbing, cyanosis, edema.  Radials/DP/PT 2+ and equal bilaterally.  Respiratory:  Respirations regular and unlabored, clear to auscultation bilaterally. GI: Soft, nontender, nondistended, BS + x 4. MS: no deformity or atrophy. Skin: warm and dry, no rash. Neuro:  Strength and sensation are intact. Psych: Normal affect.  Accessory Clinical Findings    ECG personally reviewed by me today -    - no EKG in office today.    Lab Results  Component Value Date   WBC 7.5 08/21/2023   HGB 12.8 08/21/2023   HCT 39.1 08/21/2023   MCV 83.5 08/21/2023   PLT 180 08/21/2023   Lab Results  Component Value Date   CREATININE 0.87 01/29/2024   BUN 15 01/29/2024   NA 143 01/29/2024   K 4.9 01/29/2024   CL 104 01/29/2024   CO2 22 01/29/2024   Lab Results  Component Value Date   ALT 16 03/21/2023   AST 13 03/21/2023   BILITOT 0.4 03/21/2023   Lab Results  Component Value Date   CHOL 116 01/29/2024   HDL 44 01/29/2024   LDLCALC 59 01/29/2024   TRIG 56 01/29/2024   CHOLHDL 2.6 01/29/2024    Lab Results  Component Value Date   HGBA1C 5.8 (H) 07/17/2021    Assessment & Plan    1. Chest pain/coronary artery calcification: Cardiac PET stress test in 02/2024 showed no evidence of ischemia, there was evidence of coronary calcification.  She does report a family history of CAD.  Stable with no anginal symptoms. No indication for ischemic evaluation. Continue ramipril , Crestor .  2. Palpitations: Cardiac monitor in 12/2023 showed predominantly normal sinus rhythm, rare PACs and PVCs, no significant arrhythmia.  She does report a family history of atrial fibrillation.  Most recent echocardiogram in 12/2023 showed EF 60 to 65%, normal LV function, no RWMA, mild concentric LVH, G1 DD, normal RV systolic function, mild mitral valve regurgitation.  She denies any recent  palpitations.  3. Hypertension: BP well controlled. Continue current antihypertensive regimen.   4. Hyperlipidemia: LDL was 59 in 01/2024. Continue Crestor .  5. Chronic venous insufficiency: She reports a history of vein ablation x 2. She previously followed with a vascular doctor, but has not done so recently.  I encouraged her to let us  know if we can provide her with a referral in the future.  This can also be done by her PCP.  6. OSA/obesity: She is not currently using a CPAP.  She is due for follow-up with neurology.  We did discuss that she could  potentially be a candidate for GLP-1 receptor agonist.  7. Disposition: Follow-up in 1 year, sooner if needed.      Damien JAYSON Braver, NP 02/24/2024, 9:16 AM

## 2024-02-26 ENCOUNTER — Encounter: Payer: Self-pay | Admitting: Nurse Practitioner

## 2024-03-23 ENCOUNTER — Ambulatory Visit (INDEPENDENT_AMBULATORY_CARE_PROVIDER_SITE_OTHER): Admitting: Neurology

## 2024-03-23 DIAGNOSIS — G4733 Obstructive sleep apnea (adult) (pediatric): Secondary | ICD-10-CM

## 2024-03-23 DIAGNOSIS — R079 Chest pain, unspecified: Secondary | ICD-10-CM

## 2024-03-23 DIAGNOSIS — G4720 Circadian rhythm sleep disorder, unspecified type: Secondary | ICD-10-CM

## 2024-03-23 DIAGNOSIS — G4719 Other hypersomnia: Secondary | ICD-10-CM

## 2024-03-23 DIAGNOSIS — R351 Nocturia: Secondary | ICD-10-CM

## 2024-03-23 DIAGNOSIS — R635 Abnormal weight gain: Secondary | ICD-10-CM

## 2024-03-23 DIAGNOSIS — Z820 Family history of epilepsy and other diseases of the nervous system: Secondary | ICD-10-CM

## 2024-03-23 DIAGNOSIS — R600 Localized edema: Secondary | ICD-10-CM

## 2024-03-30 NOTE — Procedures (Signed)
 Physician Interpretation: Please see link under Procedure Tab or under Encounters tab for physician report, technical report, as well as O2 titration and/or PAP titration tables (if applicable).   Referred by: Dr. Modena Callander   History and Indication for Testing (obtained from visit note dated 01/15/2024): 68 year old female with an underlying complex medical history of stroke in May 2025, hypertension, hyperlipidemia, coronary artery disease, prior smoking, COPD, history of kidney stone, carotid artery disease with status post bilateral carotid endarterectomies, hypothyroidism, and overweight state, who reports snoring and nocturia. His Epworth sleepiness score is 3 out of 24, fatigue severity score is 15 out of 63.     Review of the EEG showed no abnormal electrical discharges and symmetrical bihemispheric findings.     EKG: The EKG revealed normal sinus rhythm (NSR). ***   AUDIO/VIDEO REVIEW: The audio and video review did not show any abnormal or unusual behaviors, movements, phonations or vocalizations. The patient took *** restroom breaks. Snoring was noted, ***   POST-STUDY QUESTIONNAIRE: Post study, the patient indicated, that sleep was *** the same as usual.    IMPRESSION:    Obstructive Sleep Apnea (OSA), *** ***Central Sleep Apnea (CSA) ***Primary Snoring ***Primary Central Sleep Apnea ***Complex Sleep Apnea ***PLMD (periodic limb movement disorder [of sleep]) ***Dysfunctions associated with sleep stages or arousal from sleep ***Non-specific abnormal electrocardiogram (EKG) ***Poor sleep pattern ***Inconclusive Test   RECOMMENDATIONS:         I certify that I have reviewed the entire raw data recording prior to the issuance of this report in accordance with the Standards of Accreditation of the American Academy of Sleep Medicine (AASM).   True Mar, MD, PhD Medical Director, [facility]) Diplomat, ABPN (Neurology and  Sleep)

## 2024-04-05 ENCOUNTER — Ambulatory Visit: Payer: Self-pay | Admitting: Neurology

## 2024-04-05 DIAGNOSIS — G4733 Obstructive sleep apnea (adult) (pediatric): Secondary | ICD-10-CM

## 2024-04-07 NOTE — Progress Notes (Signed)
Thank you MJP

## 2024-04-07 NOTE — Telephone Encounter (Signed)
-----   Message from True Mar sent at 04/05/2024  6:52 PM EDT ----- Patient referred by cardiology for re-eval of her OSA, currently NOT on PAP therapy, seen by me on 02/04/24, diagnostic PSG on 03/23/24.    Please call and notify the patient that the recent sleep study showed moderate to severe obstructive sleep apnea. I recommend treatment for in the form of autoPAP. We may consider at a CPAP titration  study at a later date, if need be, which means, that we would ask her to come back in for a second sleep study with CPAP treatment. For now, I would like to start her on a so-called autoPAP machine  at home, through a DME company (of her choice, or as per insurance requirement). The DME representative will educate her on how to use the machine, how to put the mask on, etc. I have placed an order  in the chart. Please send referral, talk to patient, send report to referring MD. We will need a FU in sleep clinic for 10 weeks post-PAP set up, please arrange that with me or one of our NPs.  Thanks,   True Mar, MD, PhD [facility])    ----- Message ----- From: Rebecka Fleeta Higashi In One Three One Sent: 04/05/2024   6:48 PM EDT To: True Mar, MD

## 2024-04-07 NOTE — Telephone Encounter (Signed)
 Pt returned my call. We discussed the results of sleep study as noted below. Patient agrees to setup on autopap. Discussed DMEs, will use [employer]. Pt ok with [place] or [place] location. Discussed insurance compliance requirements (usage and 31-90 day follow-up). Pt was scheduled for initial follow-up on 06/24/24 @ 1:15 pm arrive at 1. Her questions were answered and she verbalized appreciation for the call.   Order sent to [employer] via community message. Sleep study result sent to referring provider.

## 2024-04-07 NOTE — Telephone Encounter (Signed)
 Called pt and LVM (ok per DPR) with office number asking for call back to discuss sleep study results.

## 2024-04-08 NOTE — Telephone Encounter (Signed)
 New, Maryella Shivers, Otilio Jefferson, RN; Alain Honey; Jeris Penta, [place]; 1 other Received, thank you!

## 2024-05-26 LAB — HM MAMMOGRAPHY

## 2024-06-23 NOTE — Progress Notes (Signed)
 [facility] [address] [place]. [place] [address] [phone]       OFFICE FOLLOW UP NOTE  [name] Date of Birth:  [birth year] Medical Record Number:  [id number]    Primary neurologist: Dr. Chalice Reason for visit: Initial CPAP follow-up    SUBJECTIVE:   CHIEF COMPLAINT:  Chief Complaint  Patient presents with   Obstructive Sleep Apnea    Rm 8 alone Pt is well and stable, reports no OSA/CPAP concerns.     Follow-up visit:  Prior visit: 02/04/2024 with Dr. Chalice  Brief HPI:   Jessica [name] is a 68 y.o. female who was evaluated by Dr. Buck in 01/2024 for prior diagnosis of OSA on PAP therapy.  Initially diagnosed about 25 years ago but did not note benefit from treatment and returned after 2 to 3 months.  She requested to be retested.  Sleep study 03/2024 showed moderate to severe OSA with total AHI of 23.7/h, REM AHI 46.8/h and O2 nadir 76%.  AutoPap set up 04/2024       Interval history:  Patient returns for initial CPAP compliance visit.  Compliance report as below showing excellent usage and optimal residual AHI. Currently using airfit P10 mask, tolerating well overall. Has noted improvement of daytime energy levels and sleep quality.  ESS 4/24, prior to CPAP 18/24. No questions or concerns at this time.      ROS:   14 system review of systems performed and negative with exception of those listed in HPI  PMH:  Past Medical History:  Diagnosis Date   Anemia    per patient, had it years ago before hysterectomy years ago   Arthritis    Asthma    Bronchitis    Carpal tunnel syndrome of right wrist    Chronic venous insufficiency    GERD (gastroesophageal reflux disease)    History of hiatal hernia    Hypertension    Pneumonia    PONV (postoperative nausea and vomiting)     PSH:  Past Surgical History:  Procedure Laterality Date   ABDOMINAL HYSTERECTOMY     CARPAL TUNNEL RELEASE Right 12/16/2019   Procedure:  RIGHT CARPAL TUNNEL RELEASE;  Surgeon: Addie Cordella Hamilton, MD;  Location: [facility] OR;  Service: Orthopedics;  Laterality: Right;   COLONOSCOPY  06/05/2012   Procedure: COLONOSCOPY;  Surgeon: Claudis RAYMOND Rivet, MD;  [facility]: [facility];  Service: Endoscopy;  Laterality: N/A;  125-changed to 1225 Ann to notify pt   NISSEN FUNDOPLICATION     x2   SHOULDER ARTHROSCOPY WITH SUBACROMIAL DECOMPRESSION, ROTATOR CUFF REPAIR AND BICEP TENDON REPAIR Right 12/16/2019   Procedure: RIGHT SHOULDER ARTHROSCOPY, BICEPS TENODESIS, MINI OPEN ROTATOR CUFF TEAR REPAIR, DEBRIDEMENT;  Surgeon: Addie Cordella Hamilton, MD;  Location: [facility] OR;  Service: Orthopedics;  Laterality: Right;    Social History:  Social History   Socioeconomic History   Marital status: Married    Spouse name: Not on file   Number of children: Not on file   Years of education: Not on file   Highest education level: Not on file  Occupational History   Not on file  Tobacco Use   Smoking status: Former    Types: Cigarettes   Smokeless tobacco: Never  Vaping Use   Vaping status: Never Used  Substance and Sexual Activity   Alcohol use: No   Drug use: No   Sexual activity: Not Currently  Other Topics Concern   Not on file  Social History  Narrative   Caffiene 1-2 cups daily, diet sundrop 2 liter   Working retired 2017   Lives husband, 1 grown son at home.    Social Drivers of Health   Tobacco Use: Medium Risk (06/24/2024)   Patient History    Smoking Tobacco Use: Former    Smokeless Tobacco Use: Never    Passive Exposure: Not on Actuary Strain: Not on file  Food Insecurity: Not on file  Transportation Needs: Not on file  Physical Activity: Not on file  Stress: Not on file  Social Connections: Not on file  Intimate Partner Violence: Not on file  Depression (PHQ2-9): Low Risk (03/21/2023)   Depression (PHQ2-9)    PHQ-2 Score: 1  Alcohol Screen: Not on file  Housing: Not on file  Utilities: Not on file  Health  Literacy: Not on file    Family History:  Family History  Problem Relation Age of Onset   Hypertension Mother    Alzheimer's disease Father    Sleep apnea Brother     Medications:   Current Outpatient Medications on File Prior to Visit  Medication Sig Dispense Refill   acetaminophen  (TYLENOL  8 HOUR ARTHRITIS PAIN) 650 MG CR tablet      albuterol  (VENTOLIN  HFA) 108 (90 Base) MCG/ACT inhaler Inhale 2 puffs into the lungs every 4 (four) hours as needed for wheezing or shortness of breath. 18 g 2   cetirizine (ZYRTEC) 10 MG tablet Take 10 mg by mouth daily as needed (allergies.).      esomeprazole  (NEXIUM ) 20 MG capsule Take 1 capsule (20 mg total) by mouth daily with supper. 90 capsule 1   furosemide  (LASIX ) 40 MG tablet Take 1 tablet (40 mg total) by mouth daily. 90 tablet 1   gabapentin  (NEURONTIN ) 300 MG capsule Take 1 capsule (300 mg total) by mouth 3 (three) times daily. (Patient taking differently: Take 300 mg by mouth 3 (three) times daily as needed.) 90 capsule 3   metroNIDAZOLE  (METROGEL ) 1 % gel Apply topically daily. 45 g 11   naproxen  sodium (ANAPROX ) 220 MG tablet Take 220-440 mg by mouth 3 (three) times daily as needed (pain. (Max 3 tablets/24 hrs.)).      ramipril  (ALTACE ) 10 MG capsule Take 1 capsule (10 mg total) by mouth daily. 90 capsule 3   rosuvastatin  (CRESTOR ) 20 MG tablet Take 1 tablet (20 mg total) by mouth daily. 90 tablet 3   No current facility-administered medications on file prior to visit.    Allergies:   Allergies  Allergen Reactions   Codeine Other (See Comments)    Severe headaches    Tetanus Antitoxin Swelling and Other (See Comments)    Fever    Tetanus Toxoid-Containing Vaccines Swelling and Other (See Comments)    fever   Penicillins Rash      OBJECTIVE:  Physical Exam  Vitals:   06/24/24 1303  BP: (!) 142/79  Pulse: 89  Weight: 291 lb (132 kg)  Height: 5' 3 (1.6 m)   Body mass index is 51.55 kg/m. No results  found.  General: well developed, well nourished, very pleasant elderly Caucasian female, seated, in no evident distress Head: head normocephalic and atraumatic.   Neck: supple with no carotid or supraclavicular bruits Cardiovascular: regular rate and rhythm, no murmurs  Neurologic Exam Mental Status: Awake and fully alert. Oriented to place and time. Recent and remote memory intact. Attention span, concentration and fund of knowledge appropriate. Mood and affect appropriate.  Cranial Nerves: Hearing  intact. Facial sensation intact. Face, tongue, palate moves normally and symmetrically.  Motor: Normal bulk and tone. Normal strength in all tested extremity muscles Gait and Station: Arises from chair without difficulty. Stance is normal. Gait demonstrates normal stride length and balance without use of AD.         ASSESSMENT/PLAN: Jessica [name] is a 68 y.o. year old female    OSA on CPAP :  Compliance report shows satisfactory usage with optimal residual AHI.   Continue current pressure settings 7-13 with EPR 2 Discussed continued nightly usage with ensuring greater than 4 hours nightly for optimal benefit and per insurance purposes.   Continue to follow with DME company [employer] for any needed supplies or CPAP related concerns CPAP set up 04/2024     Follow up in 1 year or call earlier if needed   CC:  PCP: Duanne Butler DASEN, MD    I personally spent a total of 15 minutes in the care of the patient today including preparing to see the patient, getting/reviewing separately obtained history, performing a medically appropriate exam/evaluation, counseling and educating, placing orders, and documenting clinical information in the EHR.  Harlene Bogaert, AGNP-BC  [facility] [address] [place], [place] [address]  Phone [phone] Fax [phone] Note: This document was prepared with digital dictation and possible smart phrase  technology. Any transcriptional errors that result from this process are unintentional.

## 2024-06-24 ENCOUNTER — Ambulatory Visit: Admitting: Adult Health

## 2024-06-24 ENCOUNTER — Encounter: Payer: Self-pay | Admitting: Adult Health

## 2024-06-24 VITALS — BP 142/79 | HR 89 | Ht 63.0 in | Wt 291.0 lb

## 2024-06-24 DIAGNOSIS — G4733 Obstructive sleep apnea (adult) (pediatric): Secondary | ICD-10-CM | POA: Diagnosis not present

## 2024-06-24 NOTE — Progress Notes (Signed)
 Community message has been sent to [employer] for pressure and supplies on 06/24/24 . DD

## 2024-06-24 NOTE — Patient Instructions (Signed)
 Your Plan:  Continue nightly use of CPAP with ensuring greater than 4 hours per night for optimal benefit  Continue to follow with your DME company [employer] for any needed supplies or CPAP related concerns  Please be sure to change your filter every month, your mask about every 3 months, hose about every 6 months, humidifier chamber about yearly. Some restrictions are imposed by your insurance carrier with regard to how frequently you can get certain supplies. Your DME company can provide further details if necessary.       Follow-up in 1 year or call earlier if needed      Thank you for coming to see us  at [facility]. I hope we have been able to provide you high quality care today.  You may receive a patient satisfaction survey over the next few weeks. We would appreciate your feedback and comments so that we may continue to improve ourselves and the health of our patients.      Follow-up in 1 year or call earlier if needed     Thank you for coming to see us  at [facility]. I hope we have been able to provide you high quality care today.  You may receive a patient satisfaction survey over the next few weeks. We would appreciate your feedback and comments so that we may continue to improve ourselves and the health of our patients.

## 2024-07-12 ENCOUNTER — Ambulatory Visit: Payer: Self-pay

## 2024-07-12 NOTE — Telephone Encounter (Signed)
 FYI Only or Action Required?: Action required by provider: Pt only wants to see Dr. Duanne. Appt scheduled for tomorrow afternoon. Pt states that knee looks distorted and may need an xray.  Patient was last seen in primary care on 03/21/2023 by Duanne Butler DASEN, MD.  Called Nurse Triage reporting Fall and Knee Injury.  Symptoms began yesterday.  Interventions attempted: Ice/heat application. OTC pain medications  Symptoms are: unchanged.  Triage Disposition: See Physician Within 24 Hours  Patient/caregiver understands and will follow disposition?: Yes               Copied from CRM #[id number]. Topic: Clinical - Red Word Triage >> Jul 12, 2024 11:11 AM Everette C wrote: [facility] that prompted transfer to Nurse Triage: The patient hit their knees, hands and forehead yesterday (primarily their left knee and hands) when they fell. Reason for Disposition  [1] MODERATE pain (e.g., interferes with normal activities, limping) AND [2] high-risk adult (e.g., age > 60 years, osteoporosis, chronic steroid use)  Answer Assessment - Initial Assessment Questions 1. MECHANISM: How did the injury happen? (e.g., twisting injury, direct blow)      Feel  2. ONSET: When did the injury happen? (e.g., minutes, hours ago)      Yesterday 3. LOCATION: Where is the injury located?      Left knee 4. APPEARANCE of INJURY: What does the injury look like?      Left knee is distorted 5. SEVERITY: Can you put weight on that leg? Can you walk?      Can walk 6. SIZE: For cuts, bruises, or swelling, ask: How large is it? (e.g., inches or centimeters;  entire joint)      Several small abrasions 7. PAIN: Is there pain? If Yes, ask: How bad is the pain?   What does it keep you from doing? (Scale 0-10; or none, mild, moderate, severe)     Yes -  8. TETANUS: For any breaks in the skin, ask: When was your last tetanus booster?     Cannot get tetanus shot. 9. OTHER SYMPTOMS: Do you have  any other symptoms?  (e.g., pop when knee injured, swelling, locking, buckling)      Hit head, Hands are painful and left knee cap looked distorted and is more swollen that right knee.  Protocols used: Knee Injury-A-AH

## 2024-07-13 ENCOUNTER — Ambulatory Visit (INDEPENDENT_AMBULATORY_CARE_PROVIDER_SITE_OTHER): Admitting: Family Medicine

## 2024-07-13 ENCOUNTER — Encounter: Payer: Self-pay | Admitting: Family Medicine

## 2024-07-13 VITALS — BP 160/84 | HR 73 | Temp 97.8°F | Ht 63.0 in | Wt 289.8 lb

## 2024-07-13 DIAGNOSIS — S0083XA Contusion of other part of head, initial encounter: Secondary | ICD-10-CM | POA: Diagnosis not present

## 2024-07-13 DIAGNOSIS — M25511 Pain in right shoulder: Secondary | ICD-10-CM

## 2024-07-13 DIAGNOSIS — S8002XA Contusion of left knee, initial encounter: Secondary | ICD-10-CM

## 2024-07-13 DIAGNOSIS — S60229A Contusion of unspecified hand, initial encounter: Secondary | ICD-10-CM

## 2024-07-13 DIAGNOSIS — M25561 Pain in right knee: Secondary | ICD-10-CM

## 2024-07-13 DIAGNOSIS — G8929 Other chronic pain: Secondary | ICD-10-CM

## 2024-07-13 NOTE — Progress Notes (Signed)
 "  Subjective:    Patient ID: Jessica [name], female    DOB: 1956-04-07, 68 y.o.   MRN: [id number]  HPI  Over the weekend, the patient fell walking down her steps.  She landed.  She suffered a contusion to her left knee.  There is significant purple bruising directly over the patella with tenderness to palpation over the left knee.  She also landed on her right hand.  There is significant bruising over the first metacarpal and the webspace between the thumb and second digit.  The patient has decreased range of motion in the MCP and PIP joints of the thumb.  She has tenderness to palpation in that area.  She also suffered a bruise to her left forehead as well as a bruise to the dorsum of her right hand and a small bruise on her right anterior knee.  However, by far, her biggest pain is located in the left thumb and left patella.  She is able to walk today but with tenderness in the patella.  She denies any headaches or vision changes or nausea or vomiting or dizziness. She also complains of chronic right knee pain.  She was referred to orthopedics last year who performed cortisone injections as well as viscosupplementation injections.  Despite receiving cortisone and viscosupplementation injections and having x-rays, the pain in her right knee persist.  She reports a feeling of instability in the right medial compartment.  This has been going on now for more than 1 year.  She is having to walk up and down steps sideways due to pain and feeling unstable in her knee.  She is requesting to proceed with an MRI to see if there is a meniscal tear or something causing her pain because her x-rays did not show significant arthritis per her report at the orthopedic office.  Past Medical History:  Diagnosis Date   Anemia    per patient, had it years ago before hysterectomy years ago   Arthritis    Asthma    Bronchitis    Carpal tunnel syndrome of right wrist    Chronic venous insufficiency    GERD  (gastroesophageal reflux disease)    History of hiatal hernia    Hypertension    Pneumonia    PONV (postoperative nausea and vomiting)    Past Surgical History:  Procedure Laterality Date   ABDOMINAL HYSTERECTOMY     CARPAL TUNNEL RELEASE Right 12/16/2019   Procedure: RIGHT CARPAL TUNNEL RELEASE;  Surgeon: Addie Cordella Hamilton, MD;  Location: [facility];  Service: Orthopedics;  Laterality: Right;   COLONOSCOPY  06/05/2012   Procedure: COLONOSCOPY;  Surgeon: Claudis RAYMOND Rivet, MD;  [facility];  Service: Endoscopy;  Laterality: N/A;  125-changed to 1225 Ann to notify pt   NISSEN FUNDOPLICATION     x2   SHOULDER ARTHROSCOPY WITH SUBACROMIAL DECOMPRESSION, ROTATOR CUFF REPAIR AND BICEP TENDON REPAIR Right 12/16/2019   Procedure: RIGHT SHOULDER ARTHROSCOPY, BICEPS TENODESIS, MINI OPEN ROTATOR CUFF TEAR REPAIR, DEBRIDEMENT;  Surgeon: Addie Cordella Hamilton, MD;  Location: [facility];  Service: Orthopedics;  Laterality: Right;   Current Outpatient Medications on File Prior to Visit  Medication Sig Dispense Refill   acetaminophen  (TYLENOL  8 HOUR ARTHRITIS PAIN) 650 MG CR tablet      albuterol  (VENTOLIN  HFA) 108 (90 Base) MCG/ACT inhaler Inhale 2 puffs into the lungs every 4 (four) hours as needed for wheezing or shortness of breath. 18 g 2   cetirizine (ZYRTEC) 10 MG tablet Take  10 mg by mouth daily as needed (allergies.).      esomeprazole  (NEXIUM ) 20 MG capsule Take 1 capsule (20 mg total) by mouth daily with supper. 90 capsule 1   furosemide  (LASIX ) 40 MG tablet Take 1 tablet (40 mg total) by mouth daily. 90 tablet 1   gabapentin  (NEURONTIN ) 300 MG capsule Take 1 capsule (300 mg total) by mouth 3 (three) times daily. (Patient taking differently: Take 300 mg by mouth 3 (three) times daily as needed.) 90 capsule 3   metroNIDAZOLE  (METROGEL ) 1 % gel Apply topically daily. 45 g 11   naproxen  sodium (ANAPROX ) 220 MG tablet Take 220-440 mg by mouth 3 (three) times daily as needed (pain. (Max 3 tablets/24  hrs.)).      ramipril  (ALTACE ) 10 MG capsule Take 1 capsule (10 mg total) by mouth daily. 90 capsule 3   rosuvastatin  (CRESTOR ) 20 MG tablet Take 1 tablet (20 mg total) by mouth daily. 90 tablet 3   No current facility-administered medications on file prior to visit.   Allergies  Allergen Reactions   Codeine Other (See Comments)    Severe headaches    Tetanus Antitoxin Swelling and Other (See Comments)    Fever    Tetanus Toxoid-Containing Vaccines Swelling and Other (See Comments)    fever   Penicillins Rash   Social History   Socioeconomic History   Marital status: Married    Spouse name: Not on file   Number of children: Not on file   Years of education: Not on file   Highest education level: Associate degree: occupational, scientist, product/process development, or vocational program  Occupational History   Not on file  Tobacco Use   Smoking status: Former    Types: Cigarettes   Smokeless tobacco: Never  Vaping Use   Vaping status: Never Used  Substance and Sexual Activity   Alcohol use: No   Drug use: No   Sexual activity: Not Currently  Other Topics Concern   Not on file  Social History Narrative   Caffiene 1-2 cups daily, diet sundrop 2 liter   Working retired 2017   Lives husband, 1 grown son at home.    Social Drivers of Health   Tobacco Use: Medium Risk (07/13/2024)   Patient History    Smoking Tobacco Use: Former    Smokeless Tobacco Use: Never    Passive Exposure: Not on file  Financial Resource Strain: Low Risk (07/12/2024)   Overall Financial Resource Strain (CARDIA)    Difficulty of Paying Living Expenses: Not very hard  Food Insecurity: No Food Insecurity (07/12/2024)   Epic    Worried About Programme Researcher, Broadcasting/film/video in the Last Year: Never true    Ran Out of Food in the Last Year: Never true  Transportation Needs: No Transportation Needs (07/12/2024)   Epic    Lack of Transportation (Medical): No    Lack of Transportation (Non-Medical): No  Physical Activity: Unknown  (07/12/2024)   Exercise Vital Sign    Days of Exercise per Week: Patient declined    Minutes of Exercise per Session: Not on file  Stress: Stress Concern Present (07/12/2024)   [employer] of Occupational Health - Occupational Stress Questionnaire    Feeling of Stress: To some extent  Social Connections: Socially Integrated (07/12/2024)   Social Connection and Isolation Panel    Frequency of Communication with Friends and Family: More than three times a week    Frequency of Social Gatherings with Friends and Family: More than three times  a week    Attends Religious Services: More than 4 times per year    Active Member of Clubs or Organizations: Yes    Attends Banker Meetings: More than 4 times per year    Marital Status: Married  Catering Manager Violence: Not on file  Depression (PHQ2-9): Low Risk (07/13/2024)   Depression (PHQ2-9)    PHQ-2 Score: 0  Alcohol Screen: Not on file  Housing: Low Risk (07/12/2024)   Epic    Unable to Pay for Housing in the Last Year: No    Number of Times Moved in the Last Year: 0    Homeless in the Last Year: No  Utilities: Not on file  Health Literacy: Not on file      Review of Systems  All other systems reviewed and are negative.      Objective:   Physical Exam HENT:     Head: Contusion present.   Neck:     Vascular: No JVD.  Cardiovascular:     Rate and Rhythm: Normal rate and regular rhythm.     Heart sounds: Normal heart sounds. No murmur heard. Pulmonary:     Effort: Pulmonary effort is normal. No respiratory distress.     Breath sounds: Normal breath sounds. No stridor. No wheezing or rales.  Musculoskeletal:     Left hand: Swelling, tenderness and bony tenderness present. Decreased range of motion.     Right knee: Decreased range of motion. Tenderness present over the medial joint line.     Left knee: Ecchymosis present. Decreased range of motion. Tenderness present.          Assessment & Plan:   Contusion of left knee, initial encounter - Plan: DG Knee Complete 4 Views Left, DG Hand Complete Left  Contusion of other part of head, initial encounter  Acute pain of right shoulder  Contusion of hand, unspecified laterality, initial encounter - Plan: DG Knee Complete 4 Views Left, DG Hand Complete Left  Chronic pain of right knee - Plan: MR Knee Right Wo Contrast Patient suffered a contusion to her left forehead.  This is asymptomatic and does not require any further workup.  The contusion on the dorsum of her right hand is mild and does not require any treatment.  I am concerned that she may have suffered a fracture in the left first metacarpal.  Proceed with an x-ray of the left hand to evaluate further.  I am also concerned that she may have suffered a fracture in the patella given the bruising and tenderness.  I recommended an x-ray of the left knee to evaluate further.  With regards to her chronic right knee pain and instability in the knee joint.  Symptoms have been present for more than a year.  X-rays were unremarkable with mild arthritis.  She was referred to orthopedics who felt knee replacement was not necessary based on the level of arthritis.  However the patient reports instability and a feeling of laxity particularly over the medial joint line.  I will proceed with an MRI of the right knee to evaluate for any ligament damage or meniscal tear that would warrant surgery. "

## 2024-07-14 ENCOUNTER — Ambulatory Visit (HOSPITAL_COMMUNITY)
Admission: RE | Admit: 2024-07-14 | Discharge: 2024-07-14 | Disposition: A | Discharge status: Home / Self Care | Source: Ambulatory Visit | Attending: Family Medicine | Admitting: Family Medicine

## 2024-07-14 DIAGNOSIS — S60229A Contusion of unspecified hand, initial encounter: Secondary | ICD-10-CM | POA: Insufficient documentation

## 2024-07-14 DIAGNOSIS — S8002XA Contusion of left knee, initial encounter: Secondary | ICD-10-CM | POA: Insufficient documentation

## 2024-07-15 ENCOUNTER — Encounter: Payer: Self-pay | Admitting: Family Medicine

## 2024-07-16 ENCOUNTER — Ambulatory Visit: Payer: Self-pay | Admitting: Family Medicine

## 2024-07-21 ENCOUNTER — Encounter: Payer: Self-pay | Admitting: Family Medicine

## 2024-07-22 ENCOUNTER — Ambulatory Visit (HOSPITAL_COMMUNITY)
Admission: RE | Admit: 2024-07-22 | Discharge: 2024-07-22 | Disposition: A | Discharge status: Home / Self Care | Source: Ambulatory Visit | Attending: Family Medicine | Admitting: Family Medicine

## 2024-07-22 ENCOUNTER — Other Ambulatory Visit: Payer: Self-pay | Admitting: Family Medicine

## 2024-07-22 DIAGNOSIS — M79641 Pain in right hand: Secondary | ICD-10-CM | POA: Diagnosis present

## 2024-07-28 ENCOUNTER — Ambulatory Visit
Admission: RE | Admit: 2024-07-28 | Discharge: 2024-07-28 | Disposition: A | Discharge status: Home / Self Care | Source: Ambulatory Visit | Attending: Family Medicine | Admitting: Family Medicine

## 2024-07-28 DIAGNOSIS — G8929 Other chronic pain: Secondary | ICD-10-CM

## 2024-07-30 ENCOUNTER — Encounter: Payer: Self-pay | Admitting: Family Medicine

## 2024-07-30 ENCOUNTER — Other Ambulatory Visit (HOSPITAL_COMMUNITY): Payer: Self-pay

## 2024-07-30 ENCOUNTER — Ambulatory Visit: Admitting: Family Medicine

## 2024-07-30 ENCOUNTER — Ambulatory Visit: Payer: Self-pay | Admitting: Family Medicine

## 2024-07-30 VITALS — BP 128/62 | HR 62 | Temp 97.8°F | Ht 63.0 in | Wt 289.0 lb

## 2024-07-30 DIAGNOSIS — M23203 Derangement of unspecified medial meniscus due to old tear or injury, right knee: Secondary | ICD-10-CM

## 2024-07-30 DIAGNOSIS — S62346A Nondisplaced fracture of base of fifth metacarpal bone, right hand, initial encounter for closed fracture: Secondary | ICD-10-CM

## 2024-07-30 DIAGNOSIS — S62346D Nondisplaced fracture of base of fifth metacarpal bone, right hand, subsequent encounter for fracture with routine healing: Secondary | ICD-10-CM | POA: Diagnosis not present

## 2024-07-30 DIAGNOSIS — M1711 Unilateral primary osteoarthritis, right knee: Secondary | ICD-10-CM

## 2024-07-30 MED ORDER — ZEPBOUND 2.5 MG/0.5ML ~~LOC~~ SOAJ: 2.5000 mg | SUBCUTANEOUS | 1 refills | Status: AC

## 2024-07-30 NOTE — Progress Notes (Signed)
 "  Subjective:    Patient ID: Jessica [name], female    DOB: [birth year], 69 y.o.   MRN: [id number]  HPI  07/13/24 Over the weekend, the patient fell walking down her steps.  She landed.  She suffered a contusion to her left knee.  There is significant purple bruising directly over the patella with tenderness to palpation over the left knee.  She also landed on her right hand.  There is significant bruising over the first metacarpal and the webspace between the thumb and second digit.  The patient has decreased range of motion in the MCP and PIP joints of the thumb.  She has tenderness to palpation in that area.  She also suffered a bruise to her left forehead as well as a bruise to the dorsum of her right hand and a small bruise on her right anterior knee.  However, by far, her biggest pain is located in the left thumb and left patella.  She is able to walk today but with tenderness in the patella.  She denies any headaches or vision changes or nausea or vomiting or dizziness. She also complains of chronic right knee pain.  She was referred to orthopedics last year who performed cortisone injections as well as viscosupplementation injections.  Despite receiving cortisone and viscosupplementation injections and having x-rays, the pain in her right knee persist.  She reports a feeling of instability in the right medial compartment.  This has been going on now for more than 1 year.  She is having to walk up and down steps sideways due to pain and feeling unstable in her knee.  She is requesting to proceed with an MRI to see if there is a meniscal tear or something causing her pain because her x-rays did not show significant arthritis per her report at the orthopedic office.  07/30/24 X-rays of the left hand confirmed a sesamoid fracture at the base of the first MCP joint.  She is currently wearing a thumb spica splint for that.  She called back later and stated that she was having pain in her right hand.  We  obtained an x-ray of this which confirms a fracture at the base of the fifth metacarpal.  I just received the report today.  She is not currently wearing any splint.  She has significant tenderness to palpation at the base of the fifth metacarpal on her right hand.  She also recently had an MRI of the right knee that showed moderate to severe tricompartmental arthritis as well as a chronic degenerative tear of the medial meniscus.  Past Medical History:  Diagnosis Date   Anemia    per patient, had it years ago before hysterectomy years ago   Arthritis    Asthma    Bronchitis    Carpal tunnel syndrome of right wrist    Chronic venous insufficiency    GERD (gastroesophageal reflux disease)    History of hiatal hernia    Hypertension    Pneumonia    PONV (postoperative nausea and vomiting)    Past Surgical History:  Procedure Laterality Date   ABDOMINAL HYSTERECTOMY     CARPAL TUNNEL RELEASE Right 12/16/2019   Procedure: RIGHT CARPAL TUNNEL RELEASE;  Surgeon: Addie Cordella Hamilton, MD;  Location: [facility];  Service: Orthopedics;  Laterality: Right;   COLONOSCOPY  06/05/2012   Procedure: COLONOSCOPY;  Surgeon: Claudis RAYMOND Rivet, MD;  [facility];  Service: Endoscopy;  Laterality: N/A;  125-changed to 1225 Ann to  notify pt   NISSEN FUNDOPLICATION     x2   SHOULDER ARTHROSCOPY WITH SUBACROMIAL DECOMPRESSION, ROTATOR CUFF REPAIR AND BICEP TENDON REPAIR Right 12/16/2019   Procedure: RIGHT SHOULDER ARTHROSCOPY, BICEPS TENODESIS, MINI OPEN ROTATOR CUFF TEAR REPAIR, DEBRIDEMENT;  Surgeon: Addie Cordella Hamilton, MD;  Location: [facility];  Service: Orthopedics;  Laterality: Right;   Current Outpatient Medications on File Prior to Visit  Medication Sig Dispense Refill   acetaminophen  (TYLENOL  8 HOUR ARTHRITIS PAIN) 650 MG CR tablet      albuterol  (VENTOLIN  HFA) 108 (90 Base) MCG/ACT inhaler Inhale 2 puffs into the lungs every 4 (four) hours as needed for wheezing or shortness of breath. 18 g 2    cetirizine (ZYRTEC) 10 MG tablet Take 10 mg by mouth daily as needed (allergies.).      esomeprazole  (NEXIUM ) 20 MG capsule Take 1 capsule (20 mg total) by mouth daily with supper. 90 capsule 1   furosemide  (LASIX ) 40 MG tablet Take 1 tablet (40 mg total) by mouth daily. 90 tablet 1   gabapentin  (NEURONTIN ) 300 MG capsule Take 1 capsule (300 mg total) by mouth 3 (three) times daily. (Patient taking differently: Take 300 mg by mouth 3 (three) times daily as needed.) 90 capsule 3   metroNIDAZOLE  (METROGEL ) 1 % gel Apply topically daily. 45 g 11   naproxen  sodium (ANAPROX ) 220 MG tablet Take 220-440 mg by mouth 3 (three) times daily as needed (pain. (Max 3 tablets/24 hrs.)).      ramipril  (ALTACE ) 10 MG capsule Take 1 capsule (10 mg total) by mouth daily. 90 capsule 3   rosuvastatin  (CRESTOR ) 20 MG tablet Take 1 tablet (20 mg total) by mouth daily. 90 tablet 3   No current facility-administered medications on file prior to visit.   Allergies  Allergen Reactions   Codeine Other (See Comments)    Severe headaches    Tetanus Antitoxin Swelling and Other (See Comments)    Fever    Tetanus Toxoid-Containing Vaccines Swelling and Other (See Comments)    fever   Penicillins Rash   Social History   Socioeconomic History   Marital status: Married    Spouse name: Not on file   Number of children: Not on file   Years of education: Not on file   Highest education level: Associate degree: occupational, scientist, product/process development, or vocational program  Occupational History   Not on file  Tobacco Use   Smoking status: Former    Types: Cigarettes   Smokeless tobacco: Never  Vaping Use   Vaping status: Never Used  Substance and Sexual Activity   Alcohol use: No   Drug use: No   Sexual activity: Not Currently  Other Topics Concern   Not on file  Social History Narrative   Caffiene 1-2 cups daily, diet sundrop 2 liter   Working retired 2017   Lives husband, 1 grown son at home.    Social Drivers of  Health   Tobacco Use: Medium Risk (07/30/2024)   Patient History    Smoking Tobacco Use: Former    Smokeless Tobacco Use: Never    Passive Exposure: Not on file  Financial Resource Strain: Low Risk (07/12/2024)   Overall Financial Resource Strain (CARDIA)    Difficulty of Paying Living Expenses: Not very hard  Food Insecurity: No Food Insecurity (07/12/2024)   Epic    Worried About Radiation Protection Practitioner of Food in the Last Year: Never true    Ran Out of Food in the Last Year: Never true  Transportation Needs: No Transportation Needs (07/12/2024)   Epic    Lack of Transportation (Medical): No    Lack of Transportation (Non-Medical): No  Physical Activity: Unknown (07/12/2024)   Exercise Vital Sign    Days of Exercise per Week: Patient declined    Minutes of Exercise per Session: Not on file  Stress: Stress Concern Present (07/12/2024)   [employer] of Occupational Health - Occupational Stress Questionnaire    Feeling of Stress: To some extent  Social Connections: Socially Integrated (07/12/2024)   Social Connection and Isolation Panel    Frequency of Communication with Friends and Family: More than three times a week    Frequency of Social Gatherings with Friends and Family: More than three times a week    Attends Religious Services: More than 4 times per year    Active Member of [employer] or Organizations: Yes    Attends Banker Meetings: More than 4 times per year    Marital Status: Married  Catering Manager Violence: Not on file  Depression (PHQ2-9): Low Risk (07/13/2024)   Depression (PHQ2-9)    PHQ-2 Score: 0  Alcohol Screen: Not on file  Housing: Low Risk (07/12/2024)   Epic    Unable to Pay for Housing in the Last Year: No    Number of Times Moved in the Last Year: 0    Homeless in the Last Year: No  Utilities: Not on file  Health Literacy: Not on file      Review of Systems  All other systems reviewed and are negative.      Objective:   Physical  Exam HENT:     Head: Contusion present.   Neck:     Vascular: No JVD.  Cardiovascular:     Rate and Rhythm: Normal rate and regular rhythm.     Heart sounds: Normal heart sounds. No murmur heard. Pulmonary:     Effort: Pulmonary effort is normal. No respiratory distress.     Breath sounds: Normal breath sounds. No stridor. No wheezing or rales.  Musculoskeletal:     Left hand: Swelling, tenderness and bony tenderness present. Decreased range of motion.     Right knee: Decreased range of motion. Tenderness present over the medial joint line.     Left knee: Ecchymosis present. Decreased range of motion. Tenderness present.          Assessment & Plan:  Closed nondisplaced fracture of base of fifth metacarpal bone of right hand, initial encounter - Plan: Ambulatory referral to Orthopedic Surgery  Old tear of medial meniscus of right knee, unspecified tear type  Osteoarthritis of right knee, unspecified osteoarthritis type I will have the patient wear a thumb spica splint until seen by orthopedics.  She will wear this to treat the sesamoid bone fracture at the base of the first MCP joint.  I will have her wear an ulnar gutter splint to treat the fifth metacarpal fracture in the right hand.  I will consult orthopedics on this as well.  She also has a chronic degenerative tear in the right medial meniscus as well as moderate to severe osteoarthritis in the right knee.  She would likely benefit from a knee replacement.  She needs to focus on weight loss to increase her odds of success with the knee replacement.  She has a history of moderate to severe sleep apnea with an apnea-hypopnea index greater than 15.  I will try to get the patient approved for Zepbound  2.5 mg subcu weekly  to help with weight loss "

## 2024-08-05 ENCOUNTER — Other Ambulatory Visit (HOSPITAL_COMMUNITY): Payer: Self-pay

## 2024-08-06 ENCOUNTER — Encounter: Payer: Self-pay | Admitting: Family Medicine

## 2024-08-06 ENCOUNTER — Other Ambulatory Visit (HOSPITAL_COMMUNITY): Payer: Self-pay

## 2024-08-12 ENCOUNTER — Encounter: Payer: Self-pay | Admitting: Orthopedic Surgery

## 2024-08-12 ENCOUNTER — Other Ambulatory Visit (INDEPENDENT_AMBULATORY_CARE_PROVIDER_SITE_OTHER): Payer: Self-pay

## 2024-08-12 ENCOUNTER — Other Ambulatory Visit: Payer: Self-pay

## 2024-08-12 ENCOUNTER — Ambulatory Visit (INDEPENDENT_AMBULATORY_CARE_PROVIDER_SITE_OTHER): Admitting: Orthopedic Surgery

## 2024-08-12 DIAGNOSIS — G8929 Other chronic pain: Secondary | ICD-10-CM

## 2024-08-12 DIAGNOSIS — M25562 Pain in left knee: Secondary | ICD-10-CM | POA: Diagnosis not present

## 2024-08-12 DIAGNOSIS — M1711 Unilateral primary osteoarthritis, right knee: Secondary | ICD-10-CM

## 2024-08-12 MED ORDER — TRIAMCINOLONE ACETONIDE 40 MG/ML IJ SUSP
40.0000 mg | INTRAMUSCULAR | Status: AC | PRN
  Administered 2024-08-12: 40 mg via INTRA_ARTICULAR

## 2024-08-12 MED ORDER — LIDOCAINE HCL 1 % IJ SOLN
5.0000 mL | INTRAMUSCULAR | Status: AC | PRN
  Administered 2024-08-12: 5 mL

## 2024-08-12 MED ORDER — BUPIVACAINE HCL 0.25 % IJ SOLN
4.0000 mL | INTRAMUSCULAR | Status: AC | PRN
  Administered 2024-08-12: 4 mL via INTRA_ARTICULAR

## 2024-08-12 NOTE — Progress Notes (Signed)
 "  Office Visit Note   Patient: Jessica [name]           Date of Birth: 06/30/1956           MRN: [id number] Visit Date: 08/12/2024 Requested by: Duanne Butler DASEN, MD [address] [place],  [place] [address] PCP: Duanne Butler DASEN, MD  Subjective: Chief Complaint  Patient presents with   Left Knee - Injury   Right Hand - Injury   Left Hand - Injury    HPI: Jessica [name] is a 69 y.o. female who presents to the office reporting right hand pain left hand pain and right knee pain.  Patient had a fall on 07/11/2024.  She sustained a base of the fifth metacarpal fracture which has been splinted for the past month.  Also sustained sesamoid fracture on the left hand at the thumb MCP joint.  That has also been splinted.  Patient also reports right knee pain since that time.  She has a known history of bilateral knee arthritis along with venous insufficiency.  Over a year ago she was in the [place] of alternating cortisone and gel injections.  She has had a lot of family issues going on taking care of loved ones.  That has predominantly passed and she is ready to refocus on her own health..                ROS: All systems reviewed are negative as they relate to the chief complaint within the history of present illness.  Patient denies fevers or chills.  Assessment & Plan: Visit Diagnoses:  1. Chronic pain of left knee     Plan: Impression is good healing of metacarpal fracture on the right hand #5 as well as fairly nontender sesamoid with some significant CMC arthritis in that left thumb.  I think she can come out of both splints and start range of motion but without any lifting over a pound with that right hand for 2 weeks then activity as tolerated.  On the right knee we injected the knee today with cortisone and we will get her preapproved for gel. This patient is diagnosed with osteoarthritis of the knee(s).    Radiographs show evidence of joint space narrowing, osteophytes,  subchondral sclerosis and/or subchondral cysts.  This patient has knee pain which interferes with functional and activities of daily living.    This patient has experienced inadequate response, adverse effects and/or intolerance with conservative treatments such as acetaminophen , NSAIDS, topical creams, physical therapy or regular exercise, knee bracing and/or weight loss.   This patient has experienced inadequate response or has a contraindication to intra articular steroid injections for at least 3 months.   This patient is not scheduled to have a total knee replacement within 6 months of starting treatment with viscosupplementation.   Follow-Up Instructions: No follow-ups on file.   Orders:  Orders Placed This Encounter  Procedures   XR KNEE 3 VIEW LEFT   No orders of the defined types were placed in this encounter.     Procedures: Large Joint Inj: R knee on 08/12/2024 5:11 PM Indications: diagnostic evaluation, joint swelling and pain Details: 18 G 1.5 in needle, superolateral approach  Arthrogram: No  Medications: 5 mL lidocaine  1 %; 4 mL bupivacaine  0.25 %; 40 mg triamcinolone  acetonide 40 MG/ML Outcome: tolerated well, no immediate complications Procedure, treatment alternatives, risks and benefits explained, specific risks discussed. Consent was given by the patient. Immediately prior to procedure a  time out was called to verify the correct patient, procedure, equipment, support staff and site/side marked as required. Patient was prepped and draped in the usual sterile fashion.       Clinical Data: No additional findings.  Objective: Vital Signs: There were no vitals taken for this visit.  Physical Exam:  Constitutional: Patient appears well-developed HEENT:  Head: Normocephalic Eyes:EOM are normal Neck: Normal range of motion Cardiovascular: Normal rate Pulmonary/chest: Effort normal Neurologic: Patient is alert Skin: Skin is warm Psychiatric: Patient has  normal mood and affect  Ortho Exam: Ortho exam demonstrates minimal tenderness at the base of the fifth metacarpal on that right-hand side.  She does have pretty reasonable composite finger flexion of digits 4 and 5 but is not quite as supple as digits 2 and 3.  Wrist range of motion intact and nontender on the right.  On the left-hand side she has minimal tenderness over the sesamoid but does have some tenderness at that [facility] joint of the thumb.  Right knee has pretty reasonable range of motion of 0-1 10 with no effusion.  Venous stasis changes present bilateral lower extremities up to about 6 cm above the ankle.  Specialty Comments:  No specialty comments available.  Imaging: XR KNEE 3 VIEW LEFT Result Date: 08/12/2024 [facility] lateral merchant radiographs left knee reviewed.  Severe end-stage arthritis is present worse in the medial and patellofemoral compartments.  No acute fracture.    PMFS History: Patient Active Problem List   Diagnosis Date Noted   Precordial pain 11/20/2023   Upper respiratory tract infection 07/18/2020   Hypertension 04/12/2019   Glucose intolerance 04/12/2019   Chronic venous insufficiency 04/06/2019   Osteopenia 04/06/2019   Past Medical History:  Diagnosis Date   Anemia    per patient, had it years ago before hysterectomy years ago   Arthritis    Asthma    Bronchitis    Carpal tunnel syndrome of right wrist    Chronic venous insufficiency    GERD (gastroesophageal reflux disease)    History of hiatal hernia    Hypertension    Pneumonia    PONV (postoperative nausea and vomiting)     Family History  Problem Relation Age of Onset   Hypertension Mother    Alzheimer's disease Father    Sleep apnea Brother     Past Surgical History:  Procedure Laterality Date   ABDOMINAL HYSTERECTOMY     CARPAL TUNNEL RELEASE Right 12/16/2019   Procedure: RIGHT CARPAL TUNNEL RELEASE;  Surgeon: Addie Cordella Hamilton, MD;  Location: [facility];  Service: Orthopedics;   Laterality: Right;   COLONOSCOPY  06/05/2012   Procedure: COLONOSCOPY;  Surgeon: Claudis RAYMOND Rivet, MD;  [facility];  Service: Endoscopy;  Laterality: N/A;  125-changed to 1225 Ann to notify pt   NISSEN FUNDOPLICATION     x2   SHOULDER ARTHROSCOPY WITH SUBACROMIAL DECOMPRESSION, ROTATOR CUFF REPAIR AND BICEP TENDON REPAIR Right 12/16/2019   Procedure: RIGHT SHOULDER ARTHROSCOPY, BICEPS TENODESIS, MINI OPEN ROTATOR CUFF TEAR REPAIR, DEBRIDEMENT;  Surgeon: Addie Cordella Hamilton, MD;  Location: [facility];  Service: Orthopedics;  Laterality: Right;   Social History   Occupational History   Not on file  Tobacco Use   Smoking status: Former    Types: Cigarettes   Smokeless tobacco: Never  Vaping Use   Vaping status: Never Used  Substance and Sexual Activity   Alcohol use: No   Drug use: No   Sexual activity: Not Currently        "

## 2024-08-13 ENCOUNTER — Other Ambulatory Visit (HOSPITAL_COMMUNITY): Payer: Self-pay

## 2024-08-13 ENCOUNTER — Telehealth: Payer: Self-pay

## 2024-08-13 NOTE — Telephone Encounter (Signed)
 Pharmacy Patient Advocate Encounter   Received notification from Patient Advice Request messages that prior authorization for Zepbound  2.5mg /0.24ml is required/requested.   Insurance verification completed.   The patient is insured through [employer].   Per test claim: PA required; PA submitted to above mentioned insurance via Latent Key/confirmation #/EOC [id number] Status is pending

## 2024-08-16 ENCOUNTER — Other Ambulatory Visit (HOSPITAL_COMMUNITY): Payer: Self-pay

## 2024-08-16 NOTE — Telephone Encounter (Signed)
 Pharmacy Patient Advocate Encounter  Received notification from [employer] that Prior Authorization for Zepbound  2.5mg /0.32ml has been APPROVED from 08/13/24 to 07/14/25. Ran test claim, Copay is $743.97. This test claim was processed through [facility]- copay amounts may vary at other pharmacies due to pharmacy/plan contracts, or as the patient moves through the different stages of their insurance plan.   Approval letter indexed to media tab.   $601.00 applied to deductible and $142.97 coinsurance.

## 2024-08-19 ENCOUNTER — Encounter: Admitting: Orthopedic Surgery

## 2025-06-23 ENCOUNTER — Ambulatory Visit: Admitting: Adult Health
